# Patient Record
Sex: Female | Born: 1955
Health system: Southern US, Community
[De-identification: ages and names within clinical notes are randomized; demographics above are authoritative.]

## PROBLEM LIST (undated history)

## (undated) DIAGNOSIS — E538 Deficiency of other specified B group vitamins: Secondary | ICD-10-CM

## (undated) DIAGNOSIS — E785 Hyperlipidemia, unspecified: Secondary | ICD-10-CM

## (undated) DIAGNOSIS — M797 Fibromyalgia: Secondary | ICD-10-CM

## (undated) DIAGNOSIS — K219 Gastro-esophageal reflux disease without esophagitis: Secondary | ICD-10-CM

## (undated) DIAGNOSIS — E039 Hypothyroidism, unspecified: Secondary | ICD-10-CM

## (undated) HISTORY — PX: FOOT SURGERY: SHX648

## (undated) HISTORY — DX: Deficiency of other specified B group vitamins: E53.8

## (undated) HISTORY — PX: CHOLECYSTECTOMY: SHX55

## (undated) HISTORY — DX: Hyperlipidemia, unspecified: E78.5

## (undated) HISTORY — DX: Hypothyroidism, unspecified: E03.9

## (undated) HISTORY — PX: KIDNEY SURGERY: SHX687

## (undated) HISTORY — PX: ABDOMINAL HYSTERECTOMY: SHX81

---

## 2004-02-25 ENCOUNTER — Ambulatory Visit: Payer: Self-pay | Admitting: Family Medicine

## 2004-05-01 ENCOUNTER — Ambulatory Visit: Payer: Self-pay | Admitting: Family Medicine

## 2004-06-05 ENCOUNTER — Ambulatory Visit: Payer: Self-pay | Admitting: Family Medicine

## 2004-09-02 ENCOUNTER — Ambulatory Visit: Payer: Self-pay | Admitting: Family Medicine

## 2005-02-09 HISTORY — PX: OTHER SURGICAL HISTORY: SHX169

## 2005-02-28 ENCOUNTER — Emergency Department (HOSPITAL_COMMUNITY): Admission: EM | Admit: 2005-02-28 | Discharge: 2005-03-01 | Payer: Self-pay | Admitting: Emergency Medicine

## 2005-03-03 ENCOUNTER — Ambulatory Visit: Payer: Self-pay | Admitting: Family Medicine

## 2005-03-26 ENCOUNTER — Ambulatory Visit: Payer: Self-pay | Admitting: Family Medicine

## 2005-03-30 ENCOUNTER — Ambulatory Visit (HOSPITAL_COMMUNITY): Admission: RE | Admit: 2005-03-30 | Discharge: 2005-03-30 | Payer: Self-pay | Admitting: Family Medicine

## 2005-04-22 ENCOUNTER — Ambulatory Visit (HOSPITAL_COMMUNITY): Admission: RE | Admit: 2005-04-22 | Discharge: 2005-04-22 | Payer: Self-pay | Admitting: Family Medicine

## 2005-07-28 ENCOUNTER — Ambulatory Visit: Payer: Self-pay | Admitting: Family Medicine

## 2005-10-06 ENCOUNTER — Ambulatory Visit: Payer: Self-pay | Admitting: Family Medicine

## 2006-03-05 ENCOUNTER — Ambulatory Visit: Payer: Self-pay | Admitting: Family Medicine

## 2007-03-23 ENCOUNTER — Encounter: Admission: RE | Admit: 2007-03-23 | Discharge: 2007-03-23 | Payer: Self-pay | Admitting: Orthopedic Surgery

## 2007-05-07 ENCOUNTER — Emergency Department (HOSPITAL_COMMUNITY): Admission: EM | Admit: 2007-05-07 | Discharge: 2007-05-07 | Payer: Self-pay | Admitting: Emergency Medicine

## 2009-03-29 ENCOUNTER — Ambulatory Visit (HOSPITAL_COMMUNITY): Admission: RE | Admit: 2009-03-29 | Discharge: 2009-03-29 | Payer: Self-pay | Admitting: Family Medicine

## 2009-07-09 ENCOUNTER — Ambulatory Visit: Payer: Self-pay | Admitting: Internal Medicine

## 2009-07-09 DIAGNOSIS — R1310 Dysphagia, unspecified: Secondary | ICD-10-CM

## 2009-07-11 DIAGNOSIS — K219 Gastro-esophageal reflux disease without esophagitis: Secondary | ICD-10-CM | POA: Insufficient documentation

## 2009-07-22 ENCOUNTER — Ambulatory Visit (HOSPITAL_COMMUNITY): Admission: RE | Admit: 2009-07-22 | Discharge: 2009-07-22 | Payer: Self-pay | Admitting: Internal Medicine

## 2009-07-22 ENCOUNTER — Ambulatory Visit: Payer: Self-pay | Admitting: Internal Medicine

## 2009-07-22 HISTORY — PX: ESOPHAGOGASTRODUODENOSCOPY: SHX1529

## 2009-07-27 ENCOUNTER — Encounter: Payer: Self-pay | Admitting: Internal Medicine

## 2009-07-29 ENCOUNTER — Encounter: Payer: Self-pay | Admitting: Internal Medicine

## 2009-10-15 ENCOUNTER — Ambulatory Visit: Payer: Self-pay | Admitting: Internal Medicine

## 2010-03-11 NOTE — Assessment & Plan Note (Signed)
Summary: fu egd in june/jbb   Visit Type:  Follow-up Visit Primary Care Provider:  nyland  Chief Complaint:  follow up- omeprazole not helping.  History of Present Illness: Followup GERD. States omeprazole 20 mg daily not quite as good as Nexium - has had about  one breakthrough episode of  reflux weekly since EGD in June of this year. She has lost 4 pounds. Esophageal ring dilated back in June. Dysphagia resolved. She does have a flare in symptoms when she drinks a certain carbonated caffeinated beverages.  States Dr. Laurell Josephs  did a colonoscopy 4 years ago at [facility] - negative per pt..  Current Problems (verified): 1)  Dysphagia  (ICD-787.29) 2)  Genella Rife  (ICD-530.81)  Current Medications (verified): 1)  Synthroid 112 Mcg Tabs (Levothyroxine Sodium) .... Take 1 Tablet By Mouth Once A Day 2)  Omeprazole 20 Mg Cpdr (Omeprazole) .... Take 1 Tablet By Mouth Once A Day 3)  Vitamin D 38756 Iu .... Once A Week 4)  B 12 Injection .... Once Monthly 5)  Hydrochlorothiazide 25 Mg Tabs (Hydrochlorothiazide) .... 1/2 Tablet Daily 6)  Savella 50 Mg Tabs (Milnacipran Hcl) .... Take Two Tablets Daily  Allergies (verified): No Known Drug Allergies  Past History:  Past Medical History: Last updated: 2009/08/03 Hypothyroidism Hypertension Gerd Vit B 12 deficiency Vitamin D deficiency Fibromyalgia  Past Surgical History: Last updated: 2009/08/03 Cholecystectomy Kidney Surgery (Blockage in left kidney) Right foot surgery (ligament) Hysterectomy  Family History: Last updated: Aug 03, 2009 Father: Deceased age 15 heart failure Mother: Living age 33   healthy  breast cancer survivor Siblings: No siblings  Social History: Last updated: 2009-08-03 Marital Status: Married Children: one daughter  Occupation: Disability  Vital Signs:  Patient profile:   55 year old female Height:      69 inches Weight:      199 pounds BMI:     29.49 Temp:     97.4 degrees F oral Pulse rate:   80 /  minute BP sitting:   130 / 88  (left arm) Cuff size:   regular  Vitals Entered By: Hendricks Limes LPN (October 15, 2009 9:29 AM)  Physical Exam  General:  pleasant alert lady no acute distress Lungs:  clear to auscultation Heart:  regular rate rhythm murmur gallop well Abdomen:  nondistended soft nontender without appreciable mass or organomegaly  Impression & Recommendations: Impression: GERD fairly well controlled with omeprazole 20 mg orally daily. She does have occasional breakthrough symptoms. Symptom response fairly good; no need to change out to another agent at this time. However, I told Ms. nice she could bump up dosing  to 40 mg daily on a p.r.n. basis. She was commended on her  weight loss.  If she did lose another 10 or 15 pounds, she probably could get by with the once daily omeprazole 20 mg with very good control of symptoms.  Unless something comes up, we'll plan this is lady back in one year.  Appended Document: Orders Update    Clinical Lists Changes  Orders: Added new Service order of Est. Patient Level III (43329) - Signed

## 2010-03-11 NOTE — Assessment & Plan Note (Signed)
Summary: DIFFICULTY SWALLOWING/CONSULT FOR EGD/SS   Primary Care Provider:  Nyland  Chief Complaint:  difficulty swalowing.  History of Present Illness: 55 year old lady with recurrent esophageal dysphagia to solid food. I've seen this lady back in the 1990s for similar symptoms. I performed an EGD back in 1996; she had a patent tubular esophagus. She responded nicely to passage of the [facility] dilator. She does have long-standing GERD which has been well controlled now on omeprazole 20 mg once daily. She saw Dr. Laurell Josephs in [place] 4 years ago and underwent a screening colonoscopy and was told everything was "OK". She also underwent EGD at that time for reasons which are not clear to me. Patient has had trouble with dysphagia for the past 6 months.  She does not smoke alcohol.  Prior barium pill esophagram years ago demonstrated multiple tertiary contractions but no structural lesion seen which would contribute to dysphagia.   Current Medications (verified): 1)  Synthroid 112 Mcg Tabs (Levothyroxine Sodium) .... Take 1 Tablet By Mouth Once A Day 2)  Omeprazole 20 Mg Cpdr (Omeprazole) .... Take 1 Tablet By Mouth Once A Day 3)  Vitamin D 16109 Iu .... Once A Week 4)  B 12 Injection .... Once Monthly 5)  Hydrochlorothiazide 25 Mg Tabs (Hydrochlorothiazide) .... 1/2 Tablet Daily 6)  Savella 50 Mg Tabs (Milnacipran Hcl) .... Take Two Tablets Daily  Allergies (verified): No Known Drug Allergies  Past History:  Family History: Last updated: 2009/07/24 Father: Deceased age 44 heart failure Mother: Living age 51   healthy  breast cancer survivor Siblings: No siblings  Social History: Last updated: 07-24-2009 Marital Status: Married Children: one daughter  Occupation: Disability  Past Medical History: Hypothyroidism Hypertension Gerd Vit B 12 deficiency Vitamin D deficiency Fibromyalgia  Past Surgical History: Cholecystectomy Kidney Surgery (Blockage in left kidney) Right foot surgery  (ligament) Hysterectomy  Family History: Father: Deceased age 23 heart failure Mother: Living age 72   healthy  breast cancer survivor Siblings: No siblings  Social History: Marital Status: Married Children: one daughter  Occupation: Disability  Vital Signs:  Patient profile:   55 year old female Height:      69 inches Weight:      203 pounds BMI:     30.09 Temp:     97.8 degrees F oral Pulse rate:   80 / minute BP sitting:   130 / 92  (left arm) Cuff size:   large  Vitals Entered By: Cloria Spring LPN (July 24, 2009 10:21 AM)  Physical Exam  General:  pleasant alert lady in no acute distress Eyes:  no scleral icterus. Conjunctiva are pink Lungs:  clear to auscultation Heart:  regular rate and rhythm without murmur gallop rub Abdomen:  nondistended positive bowel sounds soft and nontender without mass or organomegaly  Impression & Recommendations: Impression: Pleasant 55 year old lady with recurrent esophageal dysphagia to solid food. This is in a  background of GERD, well-controlled on omeprazole. She responded nicely to esophageal dilation for a lengthy period of time back in the mid 1990s. I suppose she could have an occult ring or submucosal web to account for some of her symptoms. Alternatively, she could certainly have an underlying esophageal motility disorder contributing to her symptoms.  Recommendations: Discussed diagnostic EGD with esophageal dilation. Reviewed the risks, benefits, limitations, alternatives and imponderables. Her questions have been answered. I feel this would be the most expeditious approach. She did respond nicely to passing a  large bore [facility] dilator previously and would hope  that she would have a similar response in the near future.  Appended Document: Orders Update    Clinical Lists Changes  Problems: Added new problem of GERD (ICD-530.81) Added new problem of DYSPHAGIA (ZOX-096.04) Orders: Added new Service order of New Patient  Level IV (54098) - Signed

## 2010-03-11 NOTE — Letter (Signed)
Summary: EGD/ED ORDER  EGD/ED ORDER   Imported By: Ave Filter 07/09/2009 11:02:28  _____________________________________________________________________  External Attachment:    Type:   Image     Comment:   External Document

## 2010-03-11 NOTE — Letter (Signed)
Summary: Patient Notice, Endo Biopsy Results  [facility]  [address]   [place], [place] [address]   Phone: [phone]  Fax: [phone]       July 27, 2009   [facility] [address] [place], [place]  [address] 06/02/1955    Dear Janet Gardner,  I am pleased to inform you that the biopsies taken during your recent endoscopic examination did not show any evidence of cancer upon pathologic examination.  Additional information/recommendations:  Continue with the treatment plan as outlined on the day of your exam.  Please call us if you are having persistent problems or have questions about your condition that have not been fully answered at this time.  Sincerely,    R. Roetta Sessions MD, FACP [facility] Ph: [phone]   Fax: [phone]   Appended Document: Patient Notice, Endo Biopsy Results letter mailed to pt

## 2010-10-24 ENCOUNTER — Encounter: Payer: Self-pay | Admitting: Internal Medicine

## 2011-02-10 ENCOUNTER — Other Ambulatory Visit: Payer: Self-pay

## 2011-02-10 ENCOUNTER — Emergency Department (HOSPITAL_COMMUNITY)
Admission: EM | Admit: 2011-02-10 | Discharge: 2011-02-10 | Disposition: A | Discharge status: Home / Self Care | Payer: BC Managed Care – PPO | Attending: Emergency Medicine | Admitting: Emergency Medicine

## 2011-02-10 ENCOUNTER — Emergency Department (HOSPITAL_COMMUNITY): Payer: BC Managed Care – PPO

## 2011-02-10 DIAGNOSIS — IMO0001 Reserved for inherently not codable concepts without codable children: Secondary | ICD-10-CM | POA: Insufficient documentation

## 2011-02-10 DIAGNOSIS — R110 Nausea: Secondary | ICD-10-CM | POA: Insufficient documentation

## 2011-02-10 DIAGNOSIS — Z9889 Other specified postprocedural states: Secondary | ICD-10-CM | POA: Insufficient documentation

## 2011-02-10 DIAGNOSIS — R10814 Left lower quadrant abdominal tenderness: Secondary | ICD-10-CM | POA: Insufficient documentation

## 2011-02-10 DIAGNOSIS — R1032 Left lower quadrant pain: Secondary | ICD-10-CM | POA: Insufficient documentation

## 2011-02-10 DIAGNOSIS — K219 Gastro-esophageal reflux disease without esophagitis: Secondary | ICD-10-CM | POA: Insufficient documentation

## 2011-02-10 DIAGNOSIS — Z9079 Acquired absence of other genital organ(s): Secondary | ICD-10-CM | POA: Insufficient documentation

## 2011-02-10 HISTORY — DX: Gastro-esophageal reflux disease without esophagitis: K21.9

## 2011-02-10 HISTORY — DX: Fibromyalgia: M79.7

## 2011-02-10 LAB — DIFFERENTIAL
Eosinophils Absolute: 0.1 10*3/uL (ref 0.0–0.7)
Neutro Abs: 2.7 10*3/uL (ref 1.7–7.7)
Neutrophils Relative %: 65 % (ref 43–77)

## 2011-02-10 LAB — CBC
MCH: 26.4 pg (ref 26.0–34.0)
MCHC: 31.4 g/dL (ref 30.0–36.0)
Platelets: 231 10*3/uL (ref 150–400)
RBC: 4.78 MIL/uL (ref 3.87–5.11)
RDW: 13.7 % (ref 11.5–15.5)

## 2011-02-10 LAB — COMPREHENSIVE METABOLIC PANEL
Albumin: 3.6 g/dL (ref 3.5–5.2)
Alkaline Phosphatase: 74 U/L (ref 39–117)
BUN: 11 mg/dL (ref 6–23)
Calcium: 8.9 mg/dL (ref 8.4–10.5)
Chloride: 105 mEq/L (ref 96–112)
Creatinine, Ser: 0.67 mg/dL (ref 0.50–1.10)
GFR calc Af Amer: >90 mL/min (ref >90)
Glucose, Bld: 97 mg/dL (ref 70–99)
Potassium: 4.7 mEq/L (ref 3.5–5.1)
Sodium: 138 mEq/L (ref 135–145)
Total Protein: 7.5 g/dL (ref 6.0–8.3)

## 2011-02-10 LAB — URINALYSIS, ROUTINE W REFLEX MICROSCOPIC
Bilirubin Urine: NEGATIVE
Nitrite: NEGATIVE
Specific Gravity, Urine: 1.01 (ref 1.005–1.030)
Urobilinogen, UA: 1 mg/dL (ref 0.0–1.0)

## 2011-02-10 LAB — LIPASE, BLOOD: Lipase: 22 U/L (ref 11–59)

## 2011-02-10 LAB — URINE MICROSCOPIC-ADD ON

## 2011-02-10 MED ORDER — GI COCKTAIL ~~LOC~~
30.0000 mL | Freq: Once | ORAL | Status: AC
  Administered 2011-02-10: 30 mL via ORAL
  Filled 2011-02-10: qty 30

## 2011-02-10 MED ORDER — HYDROCODONE-ACETAMINOPHEN 5-500 MG PO TABS: 1.0000 | ORAL_TABLET | Freq: Four times a day (QID) | ORAL | Status: AC | PRN

## 2011-02-10 NOTE — ED Notes (Signed)
Pt states she has indigestion that she cannot get rid of

## 2011-02-10 NOTE — ED Notes (Signed)
Pt return from CT.

## 2011-02-10 NOTE — ED Provider Notes (Signed)
History   This chart was scribed for Geoffery Lyons, MD by Clarita Crane. The patient was seen in room APA01/APA01 and the patient's care was started at 9:22AM.   CSN: [id number]  Arrival date & time 02/10/11  0854   First MD Initiated Contact with Patient 02/10/11 [phone]      Chief Complaint  Patient presents with  . Abdominal Pain  . Arm Pain    (Consider location/radiation/quality/duration/timing/severity/associated sxs/prior treatment) HPI Janet Gardner is a 56 y.o. female who presents to the Emergency Department complaining of constant moderate Left upper quadrant abdominal pain onset this morning and persistent since with associated nausea. Patient also notes she experienced similar left upper quadrant abdominal pain last night which radiated to left scapular region but pain to left scapular region has resolved. Patient notes pain is similar to that previously experienced with exacerbation of GERD but states symptoms were not relieved with use of GERD medications. Denies SOB, cough, vomiting, diarrhea, fever, urinary symptoms and recent ETOH use. Patient with h/o GERD, fibromyalgia, kidney surgery, cholecystectomy and abdominal hysterectomy.   Past Medical History  Diagnosis Date  . GERD (gastroesophageal reflux disease)   . Fibromyalgia     Past Surgical History  Procedure Date  . Kidney surgery   . Cholecystectomy   . Foot surgery     Right  . Abdominal hysterectomy     No family history on file.  History  Substance Use Topics  . Smoking status: Never Smoker   . Smokeless tobacco: Not on file  . Alcohol Use: No    OB History    Grav Para Term Preterm Abortions TAB SAB Ect Mult Living                  Review of Systems 10 Systems reviewed and are negative for acute change except as noted in the HPI.  Allergies  Review of patient's allergies indicates no known allergies.  Home Medications   Current Outpatient Rx  Name Route Sig Dispense Refill  .  LEVOTHYROXINE SODIUM 112 MCG PO TABS Oral Take 112 mcg by mouth daily.      Marland Kitchen MILNACIPRAN HCL 50 MG PO TABS Oral Take 50 mg by mouth 2 (two) times daily.      Marland Kitchen OMEPRAZOLE 20 MG PO CPDR Oral Take 20 mg by mouth daily.        BP 132/74  Pulse 80  Temp(Src) 98.1 F (36.7 C) (Oral)  Resp 16  Ht 5\' 9"  (1.753 m)  Wt 205 lb (92.987 kg)  BMI 30.27 kg/m2  SpO2 97%  Physical Exam  Nursing note and vitals reviewed. Constitutional: She is oriented to person, place, and time. She appears well-developed and well-nourished. No distress.  HENT:  Head: Normocephalic and atraumatic.  Eyes: EOM are normal. Pupils are equal, round, and reactive to light.  Neck: Neck supple. No tracheal deviation present.  Cardiovascular: Normal rate and regular rhythm.  Exam reveals no gallop and no friction rub.   No murmur heard. Pulmonary/Chest: Effort normal. No respiratory distress. She has no wheezes. She has no rales.  Abdominal: Soft. Bowel sounds are normal. She exhibits no distension. There is tenderness (moderate) in the left upper quadrant. There is no rebound, no guarding and no CVA tenderness.  Musculoskeletal: Normal range of motion. She exhibits no edema.  Neurological: She is alert and oriented to person, place, and time. No sensory deficit.  Skin: Skin is warm and dry.  Psychiatric: She has a normal mood  and affect. Her behavior is normal.    ED Course  Procedures (including critical care time)  DIAGNOSTIC STUDIES: Oxygen Saturation is 97% on room air, normal by my interpretation.    COORDINATION OF CARE: 9:27AM- Patient informed of current plan for treatment and agrees with plan at this time.  11:05AM- Patient reports she is feeling better at this time but states left sided abdominal pain is still present.   Labs Reviewed  URINALYSIS, ROUTINE W REFLEX MICROSCOPIC - Abnormal; Notable for the following:    Leukocytes, UA SMALL (*)    All other components within normal limits  URINE  MICROSCOPIC-ADD ON - Abnormal; Notable for the following:    Squamous Epithelial / LPF FEW (*)    All other components within normal limits  CBC  DIFFERENTIAL  COMPREHENSIVE METABOLIC PANEL  LIPASE, BLOOD   Ct Abdomen Pelvis Wo Contrast  02/10/2011  *RADIOLOGY REPORT*  Clinical Data: 1-day history of left flank pain.  Surgical history includes cholecystectomy, hysterectomy, and left ureteral stent placement.  CT ABDOMEN AND PELVIS WITHOUT CONTRAST 02/10/2011:  Technique:  Multidetector CT imaging of the abdomen and pelvis was performed following the standard protocol without intravenous contrast.  Comparison: Report of unenhanced CT abdomen and pelvis 08/24/2006 [facility].  Findings: No evidence of urinary tract calculi or obstruction on either side.  Within the limits of the low dose unenhanced technique, no focal parenchymal abnormality involving either kidney.  Normal low dose unenhanced appearance of the liver, spleen, pancreas, and adrenal glands.  Gallbladder surgically absent.  No biliary ductal dilation.  Mild aorto-iliac atherosclerosis without aneurysm.  Retroaortic left renal vein.  No significant lymphadenopathy.  Stomach decompressed and unremarkable.  Normal-appearing small bowel wall and colon.  Normal decompressed appendix in the right upper pelvis.  Small lipoma involving the ileocecal valve.  No ascites.  Uterus surgically absent.  Normal-appearing ovaries by CT.  No adnexal masses or free pelvic fluid.  Phleboliths in the left lower pelvis.  Urinary bladder decompressed and unremarkable.  Bone window images demonstrate mild degenerative disc disease at L2-3. Visualized lung bases clear apart from mild scarring in the right lower lobe.  Heart size normal.  IMPRESSION:  1.  No evidence of urinary tract calculi or obstruction on either side. 2.  No acute abnormalities involving the abdomen or pelvis.  Original Report Authenticated By: Arnell Sieving, M.D.     No  diagnosis found.    MDM  Labs, CT scan all unremarkable.  Will discharge to home with pain meds, time.        I personally performed the services described in this documentation, which was scribed in my presence. The recorded information has been reviewed and considered.    Geoffery Lyons, MD 02/10/11 1253

## 2011-02-10 NOTE — ED Notes (Signed)
C/o indigestion that started yesterday and now having left arm pain and left side pain. C/o nausea. Denies SOB

## 2011-02-27 ENCOUNTER — Ambulatory Visit: Payer: Medicare Other | Admitting: Gastroenterology

## 2012-10-31 ENCOUNTER — Telehealth: Payer: Self-pay | Admitting: *Deleted

## 2012-10-31 NOTE — Telephone Encounter (Signed)
Pt called stating she has an appt. Next month with Dr. Jena Gauss, pt states she is having really bad abd. Pain and every time she eat's she gets really sick, patient wanted to be seen sooner, I rescheduled her for oct. 6th with Tana Coast. Pt's cell number is [phone]

## 2012-11-09 ENCOUNTER — Encounter: Payer: Self-pay | Admitting: Internal Medicine

## 2012-11-14 ENCOUNTER — Ambulatory Visit (INDEPENDENT_AMBULATORY_CARE_PROVIDER_SITE_OTHER): Payer: Medicare Other | Admitting: Gastroenterology

## 2012-11-14 ENCOUNTER — Encounter: Payer: Self-pay | Admitting: Gastroenterology

## 2012-11-14 VITALS — BP 147/86 | HR 77 | Temp 98.4°F | Ht 69.0 in | Wt 211.2 lb

## 2012-11-14 DIAGNOSIS — K219 Gastro-esophageal reflux disease without esophagitis: Secondary | ICD-10-CM

## 2012-11-14 DIAGNOSIS — K5900 Constipation, unspecified: Secondary | ICD-10-CM | POA: Insufficient documentation

## 2012-11-14 DIAGNOSIS — R1012 Left upper quadrant pain: Secondary | ICD-10-CM | POA: Insufficient documentation

## 2012-11-14 DIAGNOSIS — R110 Nausea: Secondary | ICD-10-CM

## 2012-11-14 MED ORDER — POLYETHYLENE GLYCOL 3350 17 GM/SCOOP PO POWD: 17.0000 g | Freq: Every day | ORAL | Status: DC | PRN

## 2012-11-14 NOTE — Progress Notes (Signed)
CC'd to PCP 

## 2012-11-14 NOTE — Progress Notes (Signed)
Primary Care Physician: Josue Hector, MD  Primary Gastroenterologist:  Roetta Sessions, MD   Chief Complaint  Patient presents with  . Abdominal Pain  . Nausea  . Gas    HPI: Janet Gardner is a 57 y.o. female here for further evaluation of abdominal pain. She was last seen in September 2011. Last EGD as outlined below.  Several week history of recurrent esophageal dysphagia and abdominal pain associated with nausea. States she needs her esophagus stretched again. Prior dilations have helped significantly. Can tolerate applesauce and oatmeal been anything else she she develops left upper quadrant pain, nausea, soft no dysphagia. Previously has done well on Nexium for several years ago she states her insurance stopped paying for it. At that time she started using various over-the-counter antacids without significant relief. Recently Dr. Lysbeth Galas gave her Nexium over-the-counter samples which she's been taking, 20 mg twice a day. Helps some but has not ameliorated her symptoms. No vomiting. BM constipation to diarrhea. Mostly constipation. No significant change throughout the years. Lots of flatulence. Stools hard. May take several attempts to have a bowel movement. Some days has normal stools. No melena, brbpr. Recently had blood work with Dr. Lysbeth Galas. We have requested results. We have also requested copy of her last colonoscopy by Dr. Laurell Josephs.   Current Outpatient Prescriptions  Medication Sig Dispense Refill  . esomeprazole (NEXIUM) 20 MG capsule Take 20 mg by mouth 2 (two) times daily.      Marland Kitchen levothyroxine (SYNTHROID, LEVOTHROID) 112 MCG tablet Take 112 mcg by mouth daily.        . Milnacipran (SAVELLA) 50 MG TABS Take 50 mg by mouth 2 (two) times daily.         No current facility-administered medications for this visit.    Allergies as of 11/14/2012  . (No Known Allergies)   Past Medical History  Diagnosis Date  . GERD (gastroesophageal reflux disease)   . Fibromyalgia   .  Hypothyroidism   . B12 deficiency     monthly injection   Past Surgical History  Procedure Laterality Date  . Kidney surgery    . Cholecystectomy    . Foot surgery      Right  . Abdominal hysterectomy    . Esophagogastroduodenoscopy  07/22/2009    WUJ:[id number] apperaring esophagus s/p 56 dilator/small HH/large ulcerated gastric polyp in the prepyloric antral area. Inflammatory fibroid polyp.  . Colonoscopy  ?2007    Dr. Laurell Josephs   Family History  Problem Relation Age of Onset  . Breast cancer Mother   . Heart disease Father   . Colon cancer Neg Hx    History   Social History  . Marital Status: Married    Spouse Name: N/A    Number of Children: 1  . Years of Education: N/A   Social History Main Topics  . Smoking status: Never Smoker   . Smokeless tobacco: None  . Alcohol Use: No  . Drug Use: No  . Sexual Activity:    Other Topics Concern  . None   Social History Narrative  . None    ROS:  General: Negative for anorexia, weight loss, fever, chills, fatigue, weakness. ENT: Negative for hoarseness, difficulty swallowing , nasal congestion. CV: Negative for chest pain, angina, palpitations, dyspnea on exertion, peripheral edema.  Respiratory: Negative for dyspnea at rest, dyspnea on exertion, cough, sputum, wheezing.  GI: See history of present illness. GU:  Negative for dysuria, hematuria, urinary incontinence, urinary frequency, nocturnal urination.  Endo: Negative  for unusual weight change.    Physical Examination:   BP 147/86  Pulse 77  Temp(Src) 98.4 F (36.9 C) (Oral)  Ht 5\' 9"  (1.753 m)  Wt 211 lb 3.2 oz (95.8 kg)  BMI 31.17 kg/m2  General: Well-nourished, well-developed in no acute distress.  Eyes: No icterus. Mouth: Oropharyngeal mucosa moist and pink , no lesions erythema or exudate. Lungs: Clear to auscultation bilaterally.  Heart: Regular rate and rhythm, no murmurs rubs or gallops.  Abdomen: Bowel sounds are normal, minimal left upper quadrant  tenderness, nondistended, no hepatosplenomegaly or masses, no abdominal bruits or hernia , no rebound or guarding.   Extremities: No lower extremity edema. No clubbing or deformities. Neuro: Alert and oriented x 4   Skin: Warm and dry, no jaundice.   Psych: Alert and cooperative, normal mood and affect.

## 2012-11-14 NOTE — Patient Instructions (Addendum)
1. Upper endoscopy with Dr. Jena Gauss as planned. 2. Start Miralax one capful at bedtime on days you do not have a good BM.  3. I will discuss with Dr. Jena Gauss when your next colonoscopy should be.

## 2012-11-14 NOTE — Addendum Note (Signed)
Addended by: Jennings Books on: 11/14/2012 10:37 AM   Modules accepted: Orders

## 2012-11-14 NOTE — Assessment & Plan Note (Addendum)
Constipation, unchanged. Received copy of her last colonoscopy by Dr. Laurell Josephs. This was done in September 2007. Greater than 75% of the bowel mucosa was obscured by stool, scope was only advanced to mid-TRV colon. Subsequent ACBE was normal, contrast did not reflux appendix or terminal ileum. Will discuss with Dr. Jena Gauss regarding when next TCS should be done.  Start Miralax 17 grams daily prn.

## 2012-11-14 NOTE — Assessment & Plan Note (Signed)
57 year old lady presents with several week history of postprandial nausea, left upper quadrant discomfort, esophageal dysphagia, typical heartburn symptoms. Minimal improvement on Nexium 20 mg twice a day. Last upper endoscopy in 2011, esophagus was normal at that time and she did respond to dilation. She also had a gastric polyp removed. Recommend repeat EGD with dilation in the near future.  I have discussed the risks, alternatives, benefits with regards to but not limited to the risk of reaction to medication, bleeding, infection, perforation and the patient is agreeable to proceed. Written consent to be obtained.  Based on findings, we may need to alter her PPI therapy.

## 2012-11-15 ENCOUNTER — Encounter (HOSPITAL_COMMUNITY): Payer: Self-pay | Admitting: Pharmacy Technician

## 2012-11-16 ENCOUNTER — Encounter: Payer: Self-pay | Admitting: Gastroenterology

## 2012-11-18 NOTE — Progress Notes (Addendum)
Received copy of labs from 10/26/2012. Sodium 137, potassium 3.8, glucose 1:30, BUN 12, creatinine 0.67, total bilirubin 0.3, alkaline phosphatase 65, AST 16, ALT 15, albumin 3.7, calcium 8.2, white blood cells 6200, hemoglobin 12.6, hematocrit 38.9, platelets 259,000, amylase 23, H. pylori IgG 0.6 (negative).  Please let patient know, Her last colonoscopy with Dr. Laurell Josephs was very incomplete due to stool present and even with follow up barium enema Dr. Jena Gauss recommends another colonoscopy at this time or in very near future.   Currently patient is supposed to have EGD 11/24/12. If she wants to do TCS too (if she feels she can complete the bowel prep) lets try to add on. She would need two days of clear liquids and dulcolax 10mg  daily for three days before. Continue miralax as well.

## 2012-11-21 LAB — CBC
Amylase: 23 units/L — AB (ref 25–110)
HCT: 39 %
HELICOBACTER PYLORI AB, IGA: 0.6
HGB: 12.6 g/dL
MCV: 82.4 fL

## 2012-11-21 LAB — COMPREHENSIVE METABOLIC PANEL
AST: 16 U/L
Albumin: 3.7
Alkaline Phosphatase: 65 U/L
BUN: 12 mg/dL (ref 4–21)
Creat: 0.67
Glucose: 130
Potassium: 3.8 mmol/L
Total Bilirubin: 0.3 mg/dL

## 2012-11-21 NOTE — Progress Notes (Signed)
PT called back and left Vm that if she was to drink a lot for the colonoscopy, that she could not do so, that is why she was not clean the last time. She said she is going to pick up her little girl from school and just call her after 3:00 PM.

## 2012-11-21 NOTE — Progress Notes (Signed)
I called and informed pt. She said OK to scheduled the colonoscopy. ( She uses [facility]  [place]). She said she can be reached this afternoon. I told her that it was important that she be where she could get a call from Soledad Gerlach for her instructions today, since she will need dulcolax starting today.

## 2012-11-21 NOTE — Progress Notes (Signed)
Patient is wanting to do just the EGD/ED for now, she said maybe later down the road she will try the TCS again, but for now she dont want it

## 2012-11-21 NOTE — Progress Notes (Signed)
Maybe she can try a spit-dose prep OR Moviprep.

## 2012-11-22 NOTE — Progress Notes (Signed)
Noted  

## 2012-11-24 ENCOUNTER — Encounter (HOSPITAL_COMMUNITY): Payer: Self-pay

## 2012-11-24 ENCOUNTER — Encounter (HOSPITAL_COMMUNITY)
Admission: RE | Disposition: A | Discharge status: Home / Self Care | Payer: Self-pay | Source: Ambulatory Visit | Attending: Internal Medicine

## 2012-11-24 ENCOUNTER — Ambulatory Visit (HOSPITAL_COMMUNITY)
Admission: RE | Admit: 2012-11-24 | Discharge: 2012-11-24 | Disposition: A | Discharge status: Home / Self Care | Payer: BC Managed Care – PPO | Source: Ambulatory Visit | Attending: Internal Medicine | Admitting: Internal Medicine

## 2012-11-24 DIAGNOSIS — R1310 Dysphagia, unspecified: Secondary | ICD-10-CM

## 2012-11-24 DIAGNOSIS — K449 Diaphragmatic hernia without obstruction or gangrene: Secondary | ICD-10-CM

## 2012-11-24 DIAGNOSIS — K219 Gastro-esophageal reflux disease without esophagitis: Secondary | ICD-10-CM

## 2012-11-24 DIAGNOSIS — K5900 Constipation, unspecified: Secondary | ICD-10-CM

## 2012-11-24 DIAGNOSIS — R1012 Left upper quadrant pain: Secondary | ICD-10-CM

## 2012-11-24 DIAGNOSIS — K222 Esophageal obstruction: Secondary | ICD-10-CM | POA: Insufficient documentation

## 2012-11-24 DIAGNOSIS — R110 Nausea: Secondary | ICD-10-CM

## 2012-11-24 HISTORY — PX: ESOPHAGOGASTRODUODENOSCOPY (EGD) WITH ESOPHAGEAL DILATION: SHX5812

## 2012-11-24 SURGERY — ESOPHAGOGASTRODUODENOSCOPY (EGD) WITH ESOPHAGEAL DILATION
Anesthesia: Moderate Sedation

## 2012-11-24 MED ORDER — SODIUM CHLORIDE 0.9 % IV SOLN
INTRAVENOUS | Status: DC
  Administered 2012-11-24: 09:00:00 via INTRAVENOUS

## 2012-11-24 MED ORDER — ONDANSETRON HCL 4 MG/2ML IJ SOLN
INTRAMUSCULAR | Status: DC | PRN
  Administered 2012-11-24: 4 mg via INTRAMUSCULAR

## 2012-11-24 MED ORDER — MIDAZOLAM HCL 5 MG/5ML IJ SOLN
INTRAMUSCULAR | Status: DC | PRN
  Administered 2012-11-24: 1 mg via INTRAVENOUS
  Administered 2012-11-24: 2 mg via INTRAVENOUS
  Administered 2012-11-24: 1 mg via INTRAVENOUS
  Administered 2012-11-24: 2 mg via INTRAVENOUS

## 2012-11-24 MED ORDER — BUTAMBEN-TETRACAINE-BENZOCAINE 2-2-14 % EX AERO
INHALATION_SPRAY | CUTANEOUS | Status: DC | PRN
  Administered 2012-11-24: 2 via TOPICAL

## 2012-11-24 MED ORDER — MEPERIDINE HCL 100 MG/ML IJ SOLN
INTRAMUSCULAR | Status: AC
  Filled 2012-11-24: qty 2

## 2012-11-24 MED ORDER — ONDANSETRON HCL 4 MG/2ML IJ SOLN
INTRAMUSCULAR | Status: AC
  Filled 2012-11-24: qty 2

## 2012-11-24 MED ORDER — MEPERIDINE HCL 100 MG/ML IJ SOLN
INTRAMUSCULAR | Status: DC | PRN
  Administered 2012-11-24 (×2): 50 mg via INTRAVENOUS

## 2012-11-24 MED ORDER — MIDAZOLAM HCL 5 MG/5ML IJ SOLN
INTRAMUSCULAR | Status: AC
  Filled 2012-11-24: qty 5

## 2012-11-24 MED ORDER — STERILE WATER FOR IRRIGATION IR SOLN
Status: DC | PRN
  Administered 2012-11-24: 09:00:00

## 2012-11-24 MED ORDER — MIDAZOLAM HCL 5 MG/5ML IJ SOLN
INTRAMUSCULAR | Status: AC
  Filled 2012-11-24: qty 10

## 2012-11-24 NOTE — Interval H&P Note (Signed)
History and Physical Interval Note:  11/24/2012 9:18 AM  Janet Gardner  has presented today for surgery, with the diagnosis of GERD, LUQ ABD PAIN, NAUSEA DYSPHAGIA  The various methods of treatment have been discussed with the patient and family. After consideration of risks, benefits and other options for treatment, the patient has consented to  Procedure(s) with comments: ESOPHAGOGASTRODUODENOSCOPY (EGD) WITH ESOPHAGEAL DILATION (N/A) - 9:30AM as a surgical intervention .  The patient's history has been reviewed, patient examined, no change in status, stable for surgery.  I have reviewed the patient's chart and labs.  Questions were answered to the patient's satisfaction.     No change. EGD with esophageal dilation, etc. as appropriate.The risks, benefits, limitations, alternatives and imponderables have been reviewed with the patient. Potential for esophageal dilation, biopsy, etc. have also been reviewed.  Questions have been answered. All parties agreeable.  Eula Listen

## 2012-11-24 NOTE — H&P (View-Only) (Signed)
Primary Care Physician: NYLAND,LEONARD ROBERT, MD  Primary Gastroenterologist:  Michael Rourk, MD   Chief Complaint  Patient presents with  . Abdominal Pain  . Nausea  . Gas    HPI: Janet Gardner is a 57 y.o. female here for further evaluation of abdominal pain. She was last seen in September 2011. Last EGD as outlined below.  Several week history of recurrent esophageal dysphagia and abdominal pain associated with nausea. States she needs her esophagus stretched again. Prior dilations have helped significantly. Can tolerate applesauce and oatmeal been anything else she she develops left upper quadrant pain, nausea, soft no dysphagia. Previously has done well on Nexium for several years ago she states her insurance stopped paying for it. At that time she started using various over-the-counter antacids without significant relief. Recently Dr. Nyland gave her Nexium over-the-counter samples which she's been taking, 20 mg twice a day. Helps some but has not ameliorated her symptoms. No vomiting. BM constipation to diarrhea. Mostly constipation. No significant change throughout the years. Lots of flatulence. Stools hard. May take several attempts to have a bowel movement. Some days has normal stools. No melena, brbpr. Recently had blood work with Dr. Nyland. We have requested results. We have also requested copy of her last colonoscopy by Dr. Burke.   Current Outpatient Prescriptions  Medication Sig Dispense Refill  . esomeprazole (NEXIUM) 20 MG capsule Take 20 mg by mouth 2 (two) times daily.      . levothyroxine (SYNTHROID, LEVOTHROID) 112 MCG tablet Take 112 mcg by mouth daily.        . Milnacipran (SAVELLA) 50 MG TABS Take 50 mg by mouth 2 (two) times daily.         No current facility-administered medications for this visit.    Allergies as of 11/14/2012  . (No Known Allergies)   Past Medical History  Diagnosis Date  . GERD (gastroesophageal reflux disease)   . Fibromyalgia   .  Hypothyroidism   . B12 deficiency     monthly injection   Past Surgical History  Procedure Laterality Date  . Kidney surgery    . Cholecystectomy    . Foot surgery      Right  . Abdominal hysterectomy    . Esophagogastroduodenoscopy  07/22/2009    RMR:normal apperaring esophagus s/p 56 dilator/small HH/large ulcerated gastric polyp in the prepyloric antral area. Inflammatory fibroid polyp.  . Colonoscopy  ?2007    Dr. Burke   Family History  Problem Relation Age of Onset  . Breast cancer Mother   . Heart disease Father   . Colon cancer Neg Hx    History   Social History  . Marital Status: Married    Spouse Name: N/A    Number of Children: 1  . Years of Education: N/A   Social History Main Topics  . Smoking status: Never Smoker   . Smokeless tobacco: None  . Alcohol Use: No  . Drug Use: No  . Sexual Activity:    Other Topics Concern  . None   Social History Narrative  . None    ROS:  General: Negative for anorexia, weight loss, fever, chills, fatigue, weakness. ENT: Negative for hoarseness, difficulty swallowing , nasal congestion. CV: Negative for chest pain, angina, palpitations, dyspnea on exertion, peripheral edema.  Respiratory: Negative for dyspnea at rest, dyspnea on exertion, cough, sputum, wheezing.  GI: See history of present illness. GU:  Negative for dysuria, hematuria, urinary incontinence, urinary frequency, nocturnal urination.  Endo: Negative   for unusual weight change.    Physical Examination:   BP 147/86  Pulse 77  Temp(Src) 98.4 F (36.9 C) (Oral)  Ht 5' 9" (1.753 m)  Wt 211 lb 3.2 oz (95.8 kg)  BMI 31.17 kg/m2  General: Well-nourished, well-developed in no acute distress.  Eyes: No icterus. Mouth: Oropharyngeal mucosa moist and pink , no lesions erythema or exudate. Lungs: Clear to auscultation bilaterally.  Heart: Regular rate and rhythm, no murmurs rubs or gallops.  Abdomen: Bowel sounds are normal, minimal left upper quadrant  tenderness, nondistended, no hepatosplenomegaly or masses, no abdominal bruits or hernia , no rebound or guarding.   Extremities: No lower extremity edema. No clubbing or deformities. Neuro: Alert and oriented x 4   Skin: Warm and dry, no jaundice.   Psych: Alert and cooperative, normal mood and affect.    

## 2012-11-24 NOTE — Op Note (Signed)
[facility] [address] [place], [address]   ENDOSCOPY PROCEDURE REPORT  PATIENT: Janet, Gardner  MR#: [id number] BIRTHDATE: Jun 26, 1955 , 57  yrs. old GENDER: Female ENDOSCOPIST: R.  Roetta Sessions, MD FACP FACG REFERRED BY:  Joette Catching, M.D. PROCEDURE DATE:  11/24/2012 PROCEDURE:     EGD with Elease Hashimoto dilation followed by esophageal biopsy  INDICATIONS:     esophageal dysphagia; breakthrough GERD symptoms on twice a day Nexium  INFORMED CONSENT:   The risks, benefits, limitations, alternatives and imponderables have been discussed.  The potential for biopsy, esophogeal dilation, etc. have also been reviewed.  Questions have been answered.  All parties agreeable.  Please see the history and physical in the medical record for more information.  MEDICATIONS:  Versed 6 mg IV and Demerol 100 mg IV in divided doses. Zofran 4 mg IV  DESCRIPTION OF PROCEDURE:   The NW-[id number] ([id number])  endoscope was introduced through the mouth and advanced to the second portion of the duodenum without difficulty or limitations.  The mucosal surfaces were surveyed very carefully during advancement of the scope and upon withdrawal.  Retroflexion view of the proximal stomach and esophagogastric junction was performed.      FINDINGS: Noncritical Schatzki's ring otherwise normal-appearing esophagus. Stomach empty. Small hiatal hernia. Normal gastric mucosa. Patent pylorus. Normal first and second portion of the duodenum  THERAPEUTIC / DIAGNOSTIC MANEUVERS PERFORMED:  A 54 French Maloney dilator was passed to full insertion easily. A look back revealed no apparent complication related to this maneuver.   Subsequently, biopsies of the distal and midesophagus were taken to evaluate for eosinophilic esophagitis   COMPLICATIONS:  None  IMPRESSION:    Noncritical Schatzki's ring-status post dilation as described above. Status post esophageal biopsy after dilation. Hiatal  hernia.  RECOMMENDATIONS:  Stop Nexium; begin Dexilant 60 mg daily.  Followup on pathology.    _______________________________ R. Roetta Sessions, MD FACP [facility] eSigned:  R. Roetta Sessions, MD FACP [facility] 11/24/2012 9:57 AM     CC:

## 2012-11-29 ENCOUNTER — Encounter (HOSPITAL_COMMUNITY): Payer: Self-pay | Admitting: Internal Medicine

## 2012-11-29 ENCOUNTER — Encounter: Payer: Self-pay | Admitting: Internal Medicine

## 2012-12-01 ENCOUNTER — Telehealth: Payer: Self-pay | Admitting: *Deleted

## 2012-12-01 MED ORDER — ONDANSETRON HCL 4 MG PO TABS: 4.0000 mg | ORAL_TABLET | Freq: Three times a day (TID) | ORAL | Status: DC | PRN

## 2012-12-01 NOTE — Telephone Encounter (Signed)
Trial of Zofran short-term for nausea. Continue Dexilant once daily before breakfast. Too soon to tell if its going to help.  Make sure she is taking Miralax every day she has not had a BM. We need to adequately manage her constipation to see where her symptoms settle out.

## 2012-12-01 NOTE — Telephone Encounter (Signed)
Spoke with pt- she is aware of path results. Pt is having nausea but no vomiting and hurting under her Left rib. Her nausea occurs about 1 hour after eating and lasts for about 1-2 hours and then goes away until she eats again. Please advise.

## 2012-12-01 NOTE — Telephone Encounter (Signed)
Pt called stating she still can't eat, everything she eats makes her sick, and still has pain in rib cage. Please advise [phone] pt also wanted to know if her results came back yet, I made pt aware that it would take 7 business days

## 2012-12-02 MED ORDER — DEXLANSOPRAZOLE 60 MG PO CPDR: 60.0000 mg | DELAYED_RELEASE_CAPSULE | Freq: Every day | ORAL | Status: DC

## 2012-12-02 NOTE — Telephone Encounter (Signed)
Pt is aware. Pt is out of dexilant samples. Samples at the front desk for her to pick up. rx for dexilant ok to send to pharmacy per LSL

## 2012-12-02 NOTE — Telephone Encounter (Signed)
Tried to call pt- phone number was busy.  

## 2012-12-06 ENCOUNTER — Ambulatory Visit: Payer: Medicare Other | Admitting: Internal Medicine

## 2013-04-25 ENCOUNTER — Telehealth: Payer: Self-pay | Admitting: Internal Medicine

## 2013-04-25 NOTE — Telephone Encounter (Signed)
Asking for more samples of Dexilant please advise ?

## 2013-05-03 NOTE — Telephone Encounter (Signed)
#  3 boxes of dexilant at the front desk. LM for pt to pick up.

## 2013-06-14 DIAGNOSIS — E039 Hypothyroidism, unspecified: Secondary | ICD-10-CM | POA: Insufficient documentation

## 2015-06-17 DIAGNOSIS — E785 Hyperlipidemia, unspecified: Secondary | ICD-10-CM | POA: Insufficient documentation

## 2015-12-05 ENCOUNTER — Other Ambulatory Visit (HOSPITAL_COMMUNITY): Payer: Self-pay | Admitting: Family Medicine

## 2015-12-05 DIAGNOSIS — Z1231 Encounter for screening mammogram for malignant neoplasm of breast: Secondary | ICD-10-CM

## 2015-12-19 ENCOUNTER — Ambulatory Visit (HOSPITAL_COMMUNITY)
Admission: RE | Admit: 2015-12-19 | Discharge: 2015-12-19 | Disposition: A | Discharge status: Home / Self Care | Payer: Medicare Other | Source: Ambulatory Visit | Attending: Family Medicine | Admitting: Family Medicine

## 2015-12-19 DIAGNOSIS — Z1231 Encounter for screening mammogram for malignant neoplasm of breast: Secondary | ICD-10-CM | POA: Diagnosis not present

## 2016-01-09 ENCOUNTER — Other Ambulatory Visit (HOSPITAL_COMMUNITY): Payer: Self-pay | Admitting: Physician Assistant

## 2016-01-09 DIAGNOSIS — Z780 Asymptomatic menopausal state: Secondary | ICD-10-CM

## 2016-01-16 ENCOUNTER — Ambulatory Visit (HOSPITAL_COMMUNITY)
Admission: RE | Admit: 2016-01-16 | Discharge: 2016-01-16 | Disposition: A | Discharge status: Home / Self Care | Payer: Medicare Other | Source: Ambulatory Visit | Attending: Physician Assistant | Admitting: Physician Assistant

## 2016-01-16 DIAGNOSIS — Z79899 Other long term (current) drug therapy: Secondary | ICD-10-CM | POA: Diagnosis not present

## 2016-01-16 DIAGNOSIS — M8588 Other specified disorders of bone density and structure, other site: Secondary | ICD-10-CM | POA: Diagnosis not present

## 2016-01-16 DIAGNOSIS — Z780 Asymptomatic menopausal state: Secondary | ICD-10-CM | POA: Diagnosis present

## 2017-10-20 ENCOUNTER — Other Ambulatory Visit (HOSPITAL_COMMUNITY): Payer: Self-pay | Admitting: Family Medicine

## 2017-10-20 DIAGNOSIS — Z1231 Encounter for screening mammogram for malignant neoplasm of breast: Secondary | ICD-10-CM

## 2017-10-20 DIAGNOSIS — Z780 Asymptomatic menopausal state: Secondary | ICD-10-CM

## 2018-01-31 ENCOUNTER — Ambulatory Visit (HOSPITAL_COMMUNITY)
Admission: RE | Admit: 2018-01-31 | Discharge: 2018-01-31 | Disposition: A | Discharge status: Home / Self Care | Payer: Medicare Other | Source: Ambulatory Visit | Attending: Family Medicine | Admitting: Family Medicine

## 2018-01-31 ENCOUNTER — Encounter (HOSPITAL_COMMUNITY): Payer: Self-pay

## 2018-01-31 DIAGNOSIS — Z1231 Encounter for screening mammogram for malignant neoplasm of breast: Secondary | ICD-10-CM | POA: Diagnosis present

## 2018-01-31 DIAGNOSIS — Z780 Asymptomatic menopausal state: Secondary | ICD-10-CM | POA: Diagnosis not present

## 2018-03-09 ENCOUNTER — Ambulatory Visit (HOSPITAL_COMMUNITY)
Admission: RE | Admit: 2018-03-09 | Discharge: 2018-03-09 | Disposition: A | Discharge status: Home / Self Care | Payer: Medicare Other | Source: Ambulatory Visit | Attending: Family Medicine | Admitting: Family Medicine

## 2018-03-09 ENCOUNTER — Other Ambulatory Visit (HOSPITAL_COMMUNITY): Payer: Self-pay | Admitting: Family Medicine

## 2018-03-09 DIAGNOSIS — M79672 Pain in left foot: Secondary | ICD-10-CM | POA: Diagnosis present

## 2018-07-19 ENCOUNTER — Ambulatory Visit (INDEPENDENT_AMBULATORY_CARE_PROVIDER_SITE_OTHER): Payer: Medicare Other | Admitting: Internal Medicine

## 2018-07-21 ENCOUNTER — Ambulatory Visit (INDEPENDENT_AMBULATORY_CARE_PROVIDER_SITE_OTHER): Payer: Medicare HMO | Admitting: Internal Medicine

## 2018-07-21 ENCOUNTER — Other Ambulatory Visit: Payer: Self-pay

## 2018-07-21 ENCOUNTER — Encounter (INDEPENDENT_AMBULATORY_CARE_PROVIDER_SITE_OTHER): Payer: Self-pay | Admitting: Internal Medicine

## 2018-07-21 VITALS — BP 164/90 | HR 81 | Temp 98.1°F | Ht 69.0 in | Wt 215.6 lb

## 2018-07-21 DIAGNOSIS — R143 Flatulence: Secondary | ICD-10-CM | POA: Diagnosis not present

## 2018-07-21 NOTE — Patient Instructions (Addendum)
Continue the Simethicone. Try a Probiotic.  Abdominal Bloating When you have abdominal bloating, your abdomen may feel full, tight, or painful. It may also look bigger than normal or swollen (distended). Common causes of abdominal bloating include:  Swallowing air.  Constipation.  Problems digesting food.  Eating too much.  Irritable bowel syndrome. This is a condition that affects the large intestine.  Lactose intolerance. This is an inability to digest lactose, a natural sugar in dairy products.  Celiac disease. This is a condition that affects the ability to digest gluten, a protein found in some grains.  Gastroparesis. This is a condition that slows down the movement of food in the stomach and small intestine. It is more common in people with diabetes mellitus.  Gastroesophageal reflux disease (GERD). This is a digestive condition that makes stomach acid flow back into the esophagus.  Urinary retention. This means that the body is holding onto urine, and the bladder cannot be emptied all the way. Follow these instructions at home: Eating and drinking  Avoid eating too much.  Try not to swallow air while talking or eating.  Avoid eating while lying down.  Avoid these foods and drinks: ? Foods that cause gas, such as broccoli, cabbage, cauliflower, and baked beans. ? Carbonated drinks. ? Hard candy. ? Chewing gum. Medicines  Take over-the-counter and prescription medicines only as told by your health care provider.  Take probiotic medicines. These medicines contain live bacteria or yeasts that can help digestion.  Take coated peppermint oil capsules. Activity  Try to exercise regularly. Exercise may help to relieve bloating that is caused by gas and relieve constipation. General instructions  Keep all follow-up visits as told by your health care provider. This is important. Contact a health care provider if:  You have nausea and vomiting.  You have  diarrhea.  You have abdominal pain.  You have unusual weight loss or weight gain.  You have severe pain, and medicines do not help. Get help right away if:  You have severe chest pain.  You have trouble breathing.  You have shortness of breath.  You have trouble urinating.  You have darker urine than normal.  You have blood in your stools or have dark, tarry stools. Summary  Abdominal bloating means that the abdomen is swollen.  Common causes of abdominal bloating are swallowing air, constipation, and problems digesting food.  Avoid eating too much and avoid swallowing air.  Avoid foods that cause gas, carbonated drinks, hard candy, and chewing gum. This information is not intended to replace advice given to you by your health care provider. Make sure you discuss any questions you have with your health care provider. Document Released: 02/28/2016 Document Revised: 02/28/2016 Document Reviewed: 02/28/2016 Elsevier Interactive Patient Education  2019 [employer].

## 2018-07-21 NOTE — Progress Notes (Signed)
Subjective:    Patient ID: Janet Gardner, female    DOB: October 25, 1955, 63 y.o.   MRN: [id number]  HPI Referred by Dr. Edrick Oh for bloating .  No fever or chills. She says she has flatus "all the time". She has been using Gas X which helps some.  Her appetite is okay. She has cut out soft drinks. She is trying to loose weight. She has a BM every 2-3 days. She chew Gummies with fiber which help. Appetite is good.  She does not have acid reflux.    11/24/2012 EGD/ED dysphagia, breakthru GERD Non critical Schatzki's ring status post dilatation. Status post esophageal biopsy after dilatation. Hiatal Hernia.  Biopsy: Slight inflamed squamous epithelium. No evidence of active inflammation or fungal organism. No eosinophilic esophagitis. No evidence of intestinal metaplasia, dysplasia, or malignancy.   Review of Systems Past Medical History:  Diagnosis Date  . B12 deficiency    monthly injection  . Fibromyalgia   . GERD (gastroesophageal reflux disease)   . Hypothyroidism     Past Surgical History:  Procedure Laterality Date  . ABDOMINAL HYSTERECTOMY    . CHOLECYSTECTOMY    . colonoscopy  2007   Dr. Lavone Neri, incomplete due to poor prep. scope passed to transverse colon. ACBE normal.  . ESOPHAGOGASTRODUODENOSCOPY  07/22/2009   OAC:ZYSAYT apperaring esophagus s/p 56 dilator/small HH/large ulcerated gastric polyp in the prepyloric antral area. Inflammatory fibroid polyp.  . ESOPHAGOGASTRODUODENOSCOPY (EGD) WITH ESOPHAGEAL DILATION N/A 11/24/2012   Procedure: ESOPHAGOGASTRODUODENOSCOPY (EGD) WITH ESOPHAGEAL DILATION;  Surgeon: Daneil Dolin, MD;  Location: [facility];  Service: Endoscopy;  Laterality: N/A;  9:30AM  . FOOT SURGERY     Right  . KIDNEY SURGERY      Allergies  Allergen Reactions  . Levaquin [Levofloxacin] Nausea And Vomiting  . Sulfa Antibiotics     Vomiting    Current Outpatient Medications on File Prior to Visit  Medication Sig Dispense Refill  . Cholecalciferol  (VITAMIN D3) 1.25 MG (50000 UT) TABS Take by mouth once a week.    . levothyroxine (SYNTHROID, LEVOTHROID) 112 MCG tablet Take 112 mcg by mouth daily.      . cyanocobalamin (,VITAMIN B-12,) 1000 MCG/ML injection Inject 1,000 mcg into the muscle every 30 (thirty) days.    Marland Kitchen dexlansoprazole (DEXILANT) 60 MG capsule Take 1 capsule (60 mg total) by mouth daily. (Patient not taking: Reported on 07/21/2018) 30 capsule 5  . esomeprazole (NEXIUM) 20 MG capsule Take 20 mg by mouth 2 (two) times daily.    . Milnacipran (SAVELLA) 50 MG TABS Take 50 mg by mouth 2 (two) times daily.      . ondansetron (ZOFRAN) 4 MG tablet Take 1 tablet (4 mg total) by mouth every 8 (eight) hours as needed for nausea. (Patient not taking: Reported on 07/21/2018) 30 tablet 1  . polyethylene glycol powder (GLYCOLAX/MIRALAX) powder Take 17 g by mouth daily as needed. (Patient not taking: Reported on 07/21/2018) 527 g 3   No current facility-administered medications on file prior to visit.         Objective:   Physical Exam Blood pressure (!) 164/90, pulse 81, temperature 98.1 F (36.7 C), height 5\' 9"  (1.753 m), weight 215 lb 9.6 oz (97.8 kg). Alert and oriented. Skin warm and dry. Oral mucosa is moist.   . Sclera anicteric, conjunctivae is pink. Thyroid not enlarged. No cervical lymphadenopathy. Lungs clear. Heart regular rate and rhythm.  Abdomen is soft. Bowel sounds are positive. No hepatomegaly. No  abdominal masses felt. No tenderness.  No edema to lower extremities.          Assessment & Plan:  Flatus. Continue the Simethicone. Try a Probiotic. OV in 3 months.

## 2018-08-10 ENCOUNTER — Other Ambulatory Visit: Payer: Self-pay

## 2018-08-11 ENCOUNTER — Ambulatory Visit: Payer: Self-pay | Admitting: Family

## 2018-09-01 ENCOUNTER — Other Ambulatory Visit: Payer: Self-pay

## 2018-09-02 ENCOUNTER — Encounter: Payer: Self-pay | Admitting: Family Medicine

## 2018-09-02 ENCOUNTER — Ambulatory Visit (INDEPENDENT_AMBULATORY_CARE_PROVIDER_SITE_OTHER): Payer: Medicare HMO | Admitting: Family Medicine

## 2018-09-02 VITALS — BP 142/78 | HR 77 | Temp 97.7°F | Ht 69.0 in | Wt 217.0 lb

## 2018-09-02 DIAGNOSIS — Z9889 Other specified postprocedural states: Secondary | ICD-10-CM

## 2018-09-02 DIAGNOSIS — Z6832 Body mass index (BMI) 32.0-32.9, adult: Secondary | ICD-10-CM | POA: Diagnosis not present

## 2018-09-02 DIAGNOSIS — M8580 Other specified disorders of bone density and structure, unspecified site: Secondary | ICD-10-CM

## 2018-09-02 DIAGNOSIS — E039 Hypothyroidism, unspecified: Secondary | ICD-10-CM

## 2018-09-02 DIAGNOSIS — E538 Deficiency of other specified B group vitamins: Secondary | ICD-10-CM

## 2018-09-02 DIAGNOSIS — E785 Hyperlipidemia, unspecified: Secondary | ICD-10-CM

## 2018-09-02 DIAGNOSIS — E559 Vitamin D deficiency, unspecified: Secondary | ICD-10-CM | POA: Diagnosis not present

## 2018-09-02 DIAGNOSIS — Z90710 Acquired absence of both cervix and uterus: Secondary | ICD-10-CM | POA: Insufficient documentation

## 2018-09-02 DIAGNOSIS — M797 Fibromyalgia: Secondary | ICD-10-CM | POA: Diagnosis not present

## 2018-09-02 DIAGNOSIS — Z7689 Persons encountering health services in other specified circumstances: Secondary | ICD-10-CM

## 2018-09-02 DIAGNOSIS — Z23 Encounter for immunization: Secondary | ICD-10-CM | POA: Diagnosis not present

## 2018-09-02 DIAGNOSIS — Z9049 Acquired absence of other specified parts of digestive tract: Secondary | ICD-10-CM

## 2018-09-02 MED ORDER — CYANOCOBALAMIN 1000 MCG/ML IJ SOLN
1000.0000 ug | Freq: Once | INTRAMUSCULAR | Status: AC
  Administered 2018-09-02: 09:00:00 1000 ug via INTRAMUSCULAR

## 2018-09-02 MED ORDER — CALCIUM CARBONATE-VITAMIN D 500-200 MG-UNIT PO TABS: 1.0000 | ORAL_TABLET | Freq: Every day | ORAL | 3 refills | Status: DC

## 2018-09-02 NOTE — Patient Instructions (Signed)
It was a pleasure seeing you today, Janet Gardner.  Information regarding what we discussed is included in this packet.  Please make an appointment to see me in 3 months.   In a few days you may receive a survey in the mail or online from [employer] regarding your visit with [place] today. Please take a moment to fill this out. Your feedback is very important to our office. It can help [place] better understand your needs as well as improve your experience and satisfaction. Thank you for taking your time to complete it. We care about you.  Because of recent events of COVID-19 ("Coronavirus"), please follow CDC recommendations:   1. Wash your hand frequently 2. Avoid touching your face 3. Stay away from people who are sick 4. If you have symptoms such as fever, cough, shortness of breath then call your healthcare provider for further guidance 5. If you are sick, STAY AT HOME, unless otherwise directed by your healthcare provider. 6. Follow directions from state and national officials regarding staying safe    Please feel free to call our office if any questions or concerns arise.  Warm Regards, Monia Pouch, FNP-C [facility] [place] [address] [place], [place] [address] [phone]

## 2018-09-02 NOTE — Progress Notes (Signed)
Subjective:  Patient ID: Janet Gardner, female    DOB: [birth year], 63 y.o.   MRN: [id number]  Patient Care Team: Baruch Gouty, FNP as PCP - General (Family Medicine) Gala Romney Cristopher Estimable, MD as Consulting Physician (Gastroenterology)   Chief Complaint:  New Patient (Initial Visit) and Medical Management of Chronic Issues   HPI: Janet Gardner is a 63 y.o. female presenting on 09/02/2018 for New Patient (Initial Visit) and Medical Management of Chronic Issues   1. Encounter to establish care  Pt presents today to establish care. Pt is a former pt of Dr. Edrick Oh who has retired. Pt states her last OV with Dr. Edrick Oh was in May.   2. Acquired hypothyroidism  On repletion therapy. Last noted TSH in EHR 1.960 on 02/03/2017. Pt denies fatigue, mood changes, skin changes, hair changes, nail changes, weight changes, or heat / cole intolerances. Pt states she does have constipation at times.    3. Dyslipidemia  Pt does not exercise on a regular basis and does not watch diet. She is not on statin therapy. Last LDL 120 on 06/30/2018.    4. Fibromyalgia  Not on medications for her fibromyalgia pain. States she receives monthly B12 injections that helps to control her pain and fatigue due to the fibromyalgia.    5. Vitamin D deficiency  On oral repletion therapy. Last Vit D level was normal, 47.5, on 06/30/2018. No muscle weakness or fatigue.    6. Vitamin B12 deficiency  Receives monthly IM repletion infections. States she has had increased energy and less fibromyalgia pain since initiation of B12 repletion.      Relevant past medical, surgical, family, and social history reviewed and updated as indicated.  Allergies and medications reviewed and updated. Date reviewed: Chart in Epic.   Past Medical History:  Diagnosis Date  . B12 deficiency    monthly injection  . Fibromyalgia   . GERD (gastroesophageal reflux disease)   . Hypothyroidism     Past Surgical History:  Procedure  Laterality Date  . ABDOMINAL HYSTERECTOMY    . CHOLECYSTECTOMY    . colonoscopy  2007   Dr. Lavone Neri, incomplete due to poor prep. scope passed to transverse colon. ACBE normal.  . ESOPHAGOGASTRODUODENOSCOPY  07/22/2009   BSJ:GGEZMO apperaring esophagus s/p 56 dilator/small HH/large ulcerated gastric polyp in the prepyloric antral area. Inflammatory fibroid polyp.  . ESOPHAGOGASTRODUODENOSCOPY (EGD) WITH ESOPHAGEAL DILATION N/A 11/24/2012   Procedure: ESOPHAGOGASTRODUODENOSCOPY (EGD) WITH ESOPHAGEAL DILATION;  Surgeon: Daneil Dolin, MD;  Location: [facility];  Service: Endoscopy;  Laterality: N/A;  9:30AM  . FOOT SURGERY     Right  . KIDNEY SURGERY      Social History   Socioeconomic History  . Marital status: Married    Spouse name: Not on file  . Number of children: 1  . Years of education: Not on file  . Highest education level: Not on file  Occupational History  . Not on file  Social Needs  . Financial resource strain: Not on file  . Food insecurity    Worry: Not on file    Inability: Not on file  . Transportation needs    Medical: Not on file    Non-medical: Not on file  Tobacco Use  . Smoking status: Never Smoker  . Smokeless tobacco: Never Used  Substance and Sexual Activity  . Alcohol use: No  . Drug use: No  . Sexual activity: Not on file  Lifestyle  . Physical  activity    Days per week: Not on file    Minutes per session: Not on file  . Stress: Not on file  Relationships  . Social Herbalist on phone: Not on file    Gets together: Not on file    Attends religious service: Not on file    Active member of club or organization: Not on file    Attends meetings of clubs or organizations: Not on file    Relationship status: Not on file  . Intimate partner violence    Fear of current or ex partner: Not on file    Emotionally abused: Not on file    Physically abused: Not on file    Forced sexual activity: Not on file  Other Topics Concern  . Not  on file  Social History Narrative  . Not on file    Outpatient Encounter Medications as of 09/02/2018  Medication Sig  . Cholecalciferol (VITAMIN D3) 1.25 MG (50000 UT) TABS Take by mouth once a week.  . cyanocobalamin (,VITAMIN B-12,) 1000 MCG/ML injection Inject 1,000 mcg into the muscle every 30 (thirty) days.  Marland Kitchen levothyroxine (SYNTHROID, LEVOTHROID) 112 MCG tablet Take 112 mcg by mouth daily.    . [DISCONTINUED] dexlansoprazole (DEXILANT) 60 MG capsule Take 1 capsule (60 mg total) by mouth daily. (Patient not taking: Reported on 07/21/2018)  . [DISCONTINUED] esomeprazole (NEXIUM) 20 MG capsule Take 20 mg by mouth 2 (two) times daily.  . [DISCONTINUED] Milnacipran (SAVELLA) 50 MG TABS Take 50 mg by mouth 2 (two) times daily.    . [DISCONTINUED] ondansetron (ZOFRAN) 4 MG tablet Take 1 tablet (4 mg total) by mouth every 8 (eight) hours as needed for nausea. (Patient not taking: Reported on 07/21/2018)  . [DISCONTINUED] polyethylene glycol powder (GLYCOLAX/MIRALAX) powder Take 17 g by mouth daily as needed. (Patient not taking: Reported on 07/21/2018)   Facility-Administered Encounter Medications as of 09/02/2018  Medication  . cyanocobalamin ((VITAMIN B-12)) injection 1,000 mcg    Allergies  Allergen Reactions  . Levaquin [Levofloxacin] Nausea And Vomiting  . Sulfa Antibiotics     Vomiting    Review of Systems  Constitutional: Positive for fatigue. Negative for activity change, appetite change, chills, diaphoresis, fever and unexpected weight change.  Eyes: Negative for photophobia and visual disturbance.  Respiratory: Negative for cough, chest tightness, shortness of breath and wheezing.   Cardiovascular: Negative for chest pain, palpitations and leg swelling.  Gastrointestinal: Positive for abdominal distention (ongoing, followed by GI) and constipation. Negative for abdominal pain, anal bleeding, blood in stool, diarrhea, nausea, rectal pain and vomiting.  Endocrine: Negative for  cold intolerance, heat intolerance, polydipsia, polyphagia and polyuria.  Genitourinary: Negative for decreased urine volume, difficulty urinating and hematuria.  Musculoskeletal: Positive for arthralgias and myalgias. Negative for gait problem and joint swelling.  Neurological: Negative for dizziness, tremors, seizures, syncope, facial asymmetry, speech difficulty, weakness, light-headedness, numbness and headaches.  Hematological: Negative for adenopathy. Does not bruise/bleed easily.  Psychiatric/Behavioral: Negative for agitation, confusion, sleep disturbance and suicidal ideas.  All other systems reviewed and are negative.       Objective:  BP (!) 142/78   Pulse 77   Temp 97.7 F (36.5 C) (Temporal)   Ht '5\' 9"'  (1.753 m)   Wt 217 lb (98.4 kg)   BMI 32.05 kg/m    Wt Readings from Last 3 Encounters:  09/02/18 217 lb (98.4 kg)  07/21/18 215 lb 9.6 oz (97.8 kg)  11/24/12 205 lb (93 kg)  Physical Exam Vitals signs and nursing note reviewed.  Constitutional:      General: She is not in acute distress.    Appearance: Normal appearance. She is well-developed and well-groomed. She is obese. She is not ill-appearing, toxic-appearing or diaphoretic.  HENT:     Head: Normocephalic and atraumatic.     Jaw: There is normal jaw occlusion.     Right Ear: Hearing, tympanic membrane, ear canal and external ear normal.     Left Ear: Hearing, tympanic membrane, ear canal and external ear normal.     Nose: Nose normal.     Mouth/Throat:     Lips: Pink.     Mouth: Mucous membranes are moist.     Pharynx: Oropharynx is clear. Uvula midline.  Eyes:     General: Lids are normal.     Extraocular Movements: Extraocular movements intact.     Conjunctiva/sclera: Conjunctivae normal.     Pupils: Pupils are equal, round, and reactive to light.  Neck:     Musculoskeletal: Normal range of motion and neck supple.     Thyroid: No thyroid mass, thyromegaly or thyroid tenderness.     Vascular: No  carotid bruit or JVD.     Trachea: Trachea and phonation normal.  Cardiovascular:     Rate and Rhythm: Normal rate and regular rhythm.     Chest Wall: PMI is not displaced.     Pulses: Normal pulses.     Heart sounds: Normal heart sounds. No murmur. No friction rub. No gallop.   Pulmonary:     Effort: Pulmonary effort is normal. No respiratory distress.     Breath sounds: Normal breath sounds. No wheezing.  Abdominal:     General: Bowel sounds are normal. There is no distension or abdominal bruit.     Palpations: Abdomen is soft. There is no hepatomegaly or splenomegaly.     Tenderness: There is no abdominal tenderness. There is no right CVA tenderness or left CVA tenderness.     Hernia: No hernia is present.  Musculoskeletal: Normal range of motion.     Right lower leg: No edema.     Left lower leg: No edema.  Lymphadenopathy:     Cervical: No cervical adenopathy.  Skin:    General: Skin is warm and dry.     Capillary Refill: Capillary refill takes less than 2 seconds.     Coloration: Skin is not cyanotic, jaundiced or pale.     Findings: No rash.  Neurological:     General: No focal deficit present.     Mental Status: She is alert and oriented to person, place, and time.     Cranial Nerves: Cranial nerves are intact.     Sensory: Sensation is intact.     Motor: Motor function is intact.     Coordination: Coordination is intact.     Gait: Gait is intact.     Deep Tendon Reflexes: Reflexes are normal and symmetric.  Psychiatric:        Attention and Perception: Attention and perception normal.        Mood and Affect: Affect normal. Mood is anxious.        Speech: Speech normal.        Behavior: Behavior normal. Behavior is cooperative.        Thought Content: Thought content normal.        Cognition and Memory: Cognition and memory normal.        Judgment: Judgment normal.  Results for orders placed or performed in visit on 11/21/12  Comprehensive metabolic panel   Result Value Ref Range   Sodium 137 137 - 147 mmol/L   Potassium 3.8 mmol/L   Glucose 130    BUN 12 4 - 21 mg/dL   Creat 0.67    Total Bilirubin 0.3 mg/dL   Alkaline Phosphatase 65 U/L   AST 16 U/L   ALT 15 7 - 35 U/L   Albumin 3.7    Calcium 8.2 mg/dL  CBC  Result Value Ref Range   WBC 6.2    HGB 12.6 g/dL   HCT 39 %   MCV 82.4 fL   platelet count 259    Amylase 23 (A) 25 - 579 units/L   HELICOBACTER PYLORI AB, IGA 0.60        Pertinent labs & imaging results that were available during my care of the patient were reviewed by me and considered in my medical decision making.  Assessment & Plan:  Kaysen was seen today for new patient (initial visit) and medical management of chronic issues.  Diagnoses and all orders for this visit:  Encounter to establish care  Acquired hypothyroidism Labs pending. Last TSH in 01/2017. Will adjust therapy if warranted.  -     Thyroid Panel With TSH  Dyslipidemia Not on statin therapy. Last LDL 120. Will recheck today and initiate therapy if warranted. Diet and exercise encouraged.  -     CMP14+EGFR -     Lipid panel  Fibromyalgia Ongoing. Symptoms managed with monthly B12 injections. No new or worsening symptoms.   Vitamin D deficiency Last Vit D level normal. Labs pending. Continue repletion therapy.  -     VITAMIN D 25 Hydroxy (Vit-D Deficiency, Fractures)  Vitamin B12 deficiency B12 injection due today and given in office today. Labs pending. Will continue repletion therapy monthly.  -     CBC with Differential/Platelet -     Vitamin B12 -     cyanocobalamin ((VITAMIN B-12)) injection 1,000 mcg  BMI 32.0-32.9,adult Diet and exercise encouraged. Will recheck BMI in 3 months. Labs pending. -     CBC with Differential/Platelet -     CMP14+EGFR -     Lipid panel -     Thyroid Panel With TSH  Need for pneumococcal vaccine -     Pneumococcal conjugate vaccine 13-valent     Continue all other maintenance medications.   Follow up plan: Return in about 3 months (around 12/03/2018), or if symptoms worsen or fail to improve.  Educational handout given for survey, COVID-19  The above assessment and management plan was discussed with the patient. The patient verbalized understanding of and has agreed to the management plan. Patient is aware to call the clinic if symptoms persist or worsen. Patient is aware when to return to the clinic for a follow-up visit. Patient educated on when it is appropriate to go to the emergency department.   Monia Pouch, FNP-C [facility] [phone] 09/02/18

## 2018-09-04 MED ORDER — ATORVASTATIN CALCIUM 20 MG PO TABS: 20.0000 mg | ORAL_TABLET | Freq: Every day | ORAL | 3 refills | Status: DC

## 2018-09-04 NOTE — Addendum Note (Signed)
Addended by: Baruch Gouty on: 09/04/2018 09:56 AM   Modules accepted: Orders

## 2018-09-05 ENCOUNTER — Other Ambulatory Visit: Payer: Self-pay | Admitting: *Deleted

## 2018-09-05 ENCOUNTER — Telehealth: Payer: Self-pay | Admitting: Family Medicine

## 2018-09-05 DIAGNOSIS — E785 Hyperlipidemia, unspecified: Secondary | ICD-10-CM

## 2018-09-05 DIAGNOSIS — M8580 Other specified disorders of bone density and structure, unspecified site: Secondary | ICD-10-CM

## 2018-09-05 LAB — THYROID PANEL WITH TSH
Free Thyroxine Index: 2.9 (ref 1.2–4.9)
T3 Uptake Ratio: 25 % (ref 24–39)
T4, Total: 11.4 ug/dL (ref 4.5–12.0)
TSH: 0.915 u[IU]/mL (ref 0.450–4.500)

## 2018-09-05 LAB — LIPID PANEL
Chol/HDL Ratio: 4.2 ratio (ref 0.0–4.4)
Cholesterol, Total: 177 mg/dL (ref 100–199)
HDL: 42 mg/dL (ref >39)
LDL Calculated: 113 mg/dL — ABNORMAL HIGH (ref 0–99)
Triglycerides: 111 mg/dL (ref 0–149)
VLDL Cholesterol Cal: 22 mg/dL (ref 5–40)

## 2018-09-05 LAB — CBC WITH DIFFERENTIAL/PLATELET
Basophils Absolute: 0 10*3/uL (ref 0.0–0.2)
Basos: 1 %
EOS (ABSOLUTE): 0.2 10*3/uL (ref 0.0–0.4)
Eos: 3 %
Hematocrit: 40.3 % (ref 34.0–46.6)
Hemoglobin: 12.9 g/dL (ref 11.1–15.9)
Immature Grans (Abs): 0 10*3/uL (ref 0.0–0.1)
Immature Granulocytes: 0 %
Lymphocytes Absolute: 1.1 10*3/uL (ref 0.7–3.1)
Lymphs: 21 %
MCH: 26.8 pg (ref 26.6–33.0)
MCHC: 32 g/dL (ref 31.5–35.7)
MCV: 84 fL (ref 79–97)
Monocytes Absolute: 0.4 10*3/uL (ref 0.1–0.9)
Monocytes: 8 %
Neutrophils Absolute: 3.5 10*3/uL (ref 1.4–7.0)
Neutrophils: 67 %
Platelets: 243 10*3/uL (ref 150–450)
RBC: 4.82 x10E6/uL (ref 3.77–5.28)
RDW: 13.2 % (ref 11.7–15.4)
WBC: 5.3 10*3/uL (ref 3.4–10.8)

## 2018-09-05 LAB — VITAMIN D 25 HYDROXY (VIT D DEFICIENCY, FRACTURES): Vit D, 25-Hydroxy: 26.9 ng/mL — ABNORMAL LOW (ref 30.0–100.0)

## 2018-09-05 LAB — VITAMIN B12: Vitamin B-12: >2000 pg/mL — ABNORMAL HIGH (ref 232–1245)

## 2018-09-05 LAB — CMP14+EGFR
ALT: 15 IU/L (ref 0–32)
AST: 21 IU/L (ref 0–40)
Albumin/Globulin Ratio: 1.2 (ref 1.2–2.2)
Albumin: 3.7 g/dL — ABNORMAL LOW (ref 3.8–4.8)
Alkaline Phosphatase: 74 IU/L (ref 39–117)
BUN/Creatinine Ratio: 16 (ref 12–28)
BUN: 10 mg/dL (ref 8–27)
Bilirubin Total: 0.2 mg/dL (ref 0.0–1.2)
CO2: 23 mmol/L (ref 20–29)
Calcium: 8.7 mg/dL (ref 8.7–10.3)
Chloride: 103 mmol/L (ref 96–106)
Creatinine, Ser: 0.64 mg/dL (ref 0.57–1.00)
GFR calc Af Amer: 108 mL/min/{1.73_m2} (ref >59)
GFR calc non Af Amer: 94 mL/min/{1.73_m2} (ref >59)
Globulin, Total: 3.2 g/dL (ref 1.5–4.5)
Glucose: 82 mg/dL (ref 65–99)
Potassium: 4.4 mmol/L (ref 3.5–5.2)
Sodium: 140 mmol/L (ref 134–144)
Total Protein: 6.9 g/dL (ref 6.0–8.5)

## 2018-09-05 MED ORDER — ATORVASTATIN CALCIUM 20 MG PO TABS: 20.0000 mg | ORAL_TABLET | Freq: Every day | ORAL | 3 refills | Status: DC

## 2018-09-05 MED ORDER — CALCIUM CARBONATE-VITAMIN D 500-200 MG-UNIT PO TABS: 1.0000 | ORAL_TABLET | Freq: Every day | ORAL | 3 refills | Status: DC

## 2018-09-05 NOTE — Telephone Encounter (Signed)
Aware.  Scripts were sent to [facility] per patient request.

## 2018-09-20 ENCOUNTER — Other Ambulatory Visit: Payer: Self-pay

## 2018-09-21 ENCOUNTER — Encounter: Payer: Self-pay | Admitting: Family Medicine

## 2018-09-21 ENCOUNTER — Ambulatory Visit (INDEPENDENT_AMBULATORY_CARE_PROVIDER_SITE_OTHER): Payer: Medicare HMO | Admitting: Family Medicine

## 2018-09-21 VITALS — BP 148/78 | HR 84 | Temp 96.6°F | Ht 69.0 in | Wt 220.0 lb

## 2018-09-21 DIAGNOSIS — M5431 Sciatica, right side: Secondary | ICD-10-CM | POA: Diagnosis not present

## 2018-09-21 MED ORDER — METHYLPREDNISOLONE ACETATE 40 MG/ML IJ SUSP: 40.0000 mg | Freq: Once | INTRAMUSCULAR | Status: DC

## 2018-09-21 MED ORDER — METHYLPREDNISOLONE ACETATE 80 MG/ML IJ SUSP
40.0000 mg | Freq: Once | INTRAMUSCULAR | Status: AC
  Administered 2018-09-21: 13:00:00 40 mg via INTRAMUSCULAR

## 2018-09-21 MED ORDER — MELOXICAM 7.5 MG PO TABS: 7.5000 mg | ORAL_TABLET | Freq: Every day | ORAL | 1 refills | Status: DC

## 2018-09-21 MED ORDER — PREDNISONE 20 MG PO TABS: ORAL_TABLET | ORAL | 0 refills | Status: DC

## 2018-09-21 NOTE — Patient Instructions (Signed)
Sciatica ° °Sciatica is pain, weakness, tingling, or loss of feeling (numbness) along the sciatic nerve. The sciatic nerve starts in the lower back and goes down the back of each leg. Sciatica usually goes away on its own or with treatment. Sometimes, sciatica may come back (recur). °What are the causes? °This condition happens when the sciatic nerve is pinched or has pressure put on it. This may be the result of: °· A disk in between the bones of the spine bulging out too far (herniated disk). °· Changes in the spinal disks that occur with aging. °· A condition that affects a muscle in the butt. °· Extra bone growth near the sciatic nerve. °· A break (fracture) of the area between your hip bones (pelvis). °· Pregnancy. °· Tumor. This is rare. °What increases the risk? °You are more likely to develop this condition if you: °· Play sports that put pressure or stress on the spine. °· Have poor strength and ease of movement (flexibility). °· Have had a back injury in the past. °· Have had back surgery. °· Sit for long periods of time. °· Do activities that involve bending or lifting over and over again. °· Are very overweight (obese). °What are the signs or symptoms? °Symptoms can vary from mild to very bad. They may include: °· Any of these problems in the lower back, leg, hip, or butt: °? Mild tingling, loss of feeling, or dull aches. °? Burning sensations. °? Sharp pains. °· Loss of feeling in the back of the calf or the sole of the foot. °· Leg weakness. °· Very bad back pain that makes it hard to move. °These symptoms may get worse when you cough, sneeze, or laugh. They may also get worse when you sit or stand for long periods of time. °How is this treated? °This condition often gets better without any treatment. However, treatment may include: °· Changing or cutting back on physical activity when you have pain. °· Doing exercises and stretching. °· Putting ice or heat on the affected area. °· Medicines that  help: °? To relieve pain and swelling. °? To relax your muscles. °· Shots (injections) of medicines that help to relieve pain, irritation, and swelling. °· Surgery. °Follow these instructions at home: °Medicines °· Take over-the-counter and prescription medicines only as told by your doctor. °· Ask your doctor if the medicine prescribed to you: °? Requires you to avoid driving or using heavy machinery. °? Can cause trouble pooping (constipation). You may need to take these steps to prevent or treat trouble pooping: °§ Drink enough fluids to keep your pee (urine) pale yellow. °§ Take over-the-counter or prescription medicines. °§ Eat foods that are high in fiber. These include beans, whole grains, and fresh fruits and vegetables. °§ Limit foods that are high in fat and sugar. These include fried or sweet foods. °Managing pain ° °  ° °· If told, put ice on the affected area. °? Put ice in a plastic bag. °? Place a towel between your skin and the bag. °? Leave the ice on for 20 minutes, 2-3 times a day. °· If told, put heat on the affected area. Use the heat source that your doctor tells you to use, such as a moist heat pack or a heating pad. °? Place a towel between your skin and the heat source. °? Leave the heat on for 20-30 minutes. °? Remove the heat if your skin turns bright red. This is very important if you are   unable to feel pain, heat, or cold. You may have a greater risk of getting burned. °Activity ° °· Return to your normal activities as told by your doctor. Ask your doctor what activities are safe for you. °· Avoid activities that make your symptoms worse. °· Take short rests during the day. °? When you rest for a long time, do some physical activity or stretching between periods of rest. °? Avoid sitting for a long time without moving. Get up and move around at least one time each hour. °· Exercise and stretch regularly, as told by your doctor. °· Do not lift anything that is heavier than 10 lb (4.5 kg)  while you have symptoms of sciatica. °? Avoid lifting heavy things even when you do not have symptoms. °? Avoid lifting heavy things over and over. °· When you lift objects, always lift in a way that is safe for your body. To do this, you should: °? Bend your knees. °? Keep the object close to your body. °? Avoid twisting. °General instructions °· Stay at a healthy weight. °· Wear comfortable shoes that support your feet. Avoid wearing high heels. °· Avoid sleeping on a mattress that is too soft or too hard. You might have less pain if you sleep on a mattress that is firm enough to support your back. °· Keep all follow-up visits as told by your doctor. This is important. °Contact a doctor if: °· You have pain that: °? Wakes you up when you are sleeping. °? Gets worse when you lie down. °? Is worse than the pain you have had in the past. °? Lasts longer than 4 weeks. °· You lose weight without trying. °Get help right away if: °· You cannot control when you pee (urinate) or poop (have a bowel movement). °· You have weakness in any of these areas and it gets worse: °? Lower back. °? The area between your hip bones. °? Butt. °? Legs. °· You have redness or swelling of your back. °· You have a burning feeling when you pee. °Summary °· Sciatica is pain, weakness, tingling, or loss of feeling (numbness) along the sciatic nerve. °· This condition happens when the sciatic nerve is pinched or has pressure put on it. °· Sciatica can cause pain, tingling, or loss of feeling (numbness) in the lower back, legs, hips, and butt. °· Treatment often includes rest, exercise, medicines, and putting ice or heat on the affected area. °This information is not intended to replace advice given to you by your health care provider. Make sure you discuss any questions you have with your health care provider. °Document Released: 11/05/2007 Document Revised: 02/14/2018 Document Reviewed: 02/14/2018 °Elsevier Patient Education © 2020 [employer]  [employer] ° °

## 2018-09-21 NOTE — Progress Notes (Signed)
Subjective:    Janet Gardner is a 63 y.o. female who presents with right hip pain. Onset of the symptoms was about 1 year ago. Inciting event: sitting on a riding lawn mower. The patient reports the hip pain is worse with weight bearing, is aggravated by walking and comes and goes. Aggravating symptoms include: any weight bearing, going up and down stairs, pivoting, rising after sitting, standing and walking. Patient has had prior hip problems. Previous visits for this problem: yes, seen by Dr. Lysbeth GalasNyland. Evaluation to date: steroids and NSAIDs. Treatment to date: have been beneficial.  Pt states she has flares every now and then. She has pain that starts in her right buttock / hip that runs down her leg. No new injury. No loss of function, bowel or bladder incontinence, saddle anesthesia, or weakness.   The following portions of the patient's history were reviewed and updated as appropriate: allergies, current medications, past family history, past medical history, past social history, past surgical history and problem list.   Review of Systems Constitutional: negative Eyes: negative Ears, nose, mouth, throat, and face: negative Respiratory: negative Cardiovascular: negative Gastrointestinal: negative Genitourinary:negative Musculoskeletal:positive for arthralgias and myalgias Neurological: positive for tingling in right thigh   Objective:    BP (!) 148/78   Pulse 84   Temp (!) 96.6 F (35.9 C)   Ht 5\' 9"  (1.753 m)   Wt 220 lb (99.8 kg)   BMI 32.49 kg/m  Physical Exam  Constitutional: She is oriented to person, place, and time. She appears distressed (mild).  HENT:  Head: Normocephalic and atraumatic.  Eyes: Pupils are equal, round, and reactive to light. Conjunctivae are normal.  Neck: Normal range of motion. Neck supple.  Cardiovascular: Normal rate, regular rhythm and intact distal pulses.  Pulmonary/Chest: Effort normal and breath sounds normal. No respiratory distress.   Musculoskeletal:     Right hip: She exhibits tenderness (right SI joint tenderness). She exhibits normal range of motion, normal strength, no bony tenderness, no swelling, no crepitus, no deformity and no laceration.     Left hip: Normal.     Right knee: Normal.     Thoracic back: Normal.     Lumbar back: Normal.       Back:  Neurological: She is alert and oriented to person, place, and time. Gait normal.  Skin: Skin is warm and dry. She is not diaphoretic.  Psychiatric: Mood, memory, affect and judgment normal.     Assessment:   Lawson FiscalLori was seen today for right hip pain.  Diagnoses and all orders for this visit:  Right sided sciatica Signs and symptoms consistent with right sciatica. No new injury, no red flags. Pt has not had PT for sciatica, will refer today. Medications as prescribed. Follow up in 6 weeks. Report any new or worsening symptoms.  -     Ambulatory referral to Physical Therapy -     predniSONE (DELTASONE) 20 MG tablet; 2 po at sametime daily for 5 days -     meloxicam (MOBIC) 7.5 MG tablet; Take 1 tablet (7.5 mg total) by mouth daily -     methylPREDNISolone acetate (DEPO-MEDROL) injection 40 mg     Plan:    Natural history and expected course discussed. Questions answered. Transport plannerducational materials distributed. NSAIDs per medication orders. PT referral. Follow up in 6 weeks.    The above assessment and management plan was discussed with the patient. The patient verbalized understanding of and has agreed to the management plan.  Patient is aware to call the clinic if symptoms fail to improve or worsen. Patient is aware when to return to the clinic for a follow-up visit. Patient educated on when it is appropriate to go to the emergency department.   Monia Pouch, FNP-C [facility] [address] [place], [place] [address] [phone]

## 2018-10-13 ENCOUNTER — Ambulatory Visit (INDEPENDENT_AMBULATORY_CARE_PROVIDER_SITE_OTHER): Payer: Medicare HMO | Admitting: *Deleted

## 2018-10-13 ENCOUNTER — Other Ambulatory Visit: Payer: Self-pay

## 2018-10-13 DIAGNOSIS — Z23 Encounter for immunization: Secondary | ICD-10-CM | POA: Diagnosis not present

## 2018-10-26 ENCOUNTER — Encounter: Payer: Self-pay | Admitting: *Deleted

## 2018-10-28 ENCOUNTER — Encounter: Payer: Self-pay | Admitting: Family Medicine

## 2018-10-28 ENCOUNTER — Other Ambulatory Visit: Payer: Self-pay

## 2018-10-28 ENCOUNTER — Ambulatory Visit (INDEPENDENT_AMBULATORY_CARE_PROVIDER_SITE_OTHER): Payer: Medicare HMO | Admitting: Family Medicine

## 2018-10-28 VITALS — BP 138/73 | HR 67 | Temp 98.6°F | Resp 18 | Ht 69.0 in | Wt 220.0 lb

## 2018-10-28 DIAGNOSIS — M5431 Sciatica, right side: Secondary | ICD-10-CM

## 2018-10-28 MED ORDER — KETOROLAC TROMETHAMINE 60 MG/2ML IM SOLN
30.0000 mg | Freq: Once | INTRAMUSCULAR | Status: AC
  Administered 2018-10-28: 30 mg via INTRAMUSCULAR

## 2018-10-28 MED ORDER — METHYLPREDNISOLONE ACETATE 40 MG/ML IJ SUSP: 40.0000 mg | Freq: Once | INTRAMUSCULAR | Status: DC

## 2018-10-28 MED ORDER — METHYLPREDNISOLONE ACETATE 80 MG/ML IJ SUSP
40.0000 mg | Freq: Once | INTRAMUSCULAR | Status: AC
  Administered 2018-10-28: 40 mg via INTRAMUSCULAR

## 2018-10-28 NOTE — Patient Instructions (Addendum)
Sciatica Rehab Ask your health care provider which exercises are safe for you. Do exercises exactly as told by your health care provider and adjust them as directed. It is normal to feel mild stretching, pulling, tightness, or discomfort as you do these exercises. Stop right away if you feel sudden pain or your pain gets worse. Do not begin these exercises until told by your health care provider. Stretching and range-of-motion exercises These exercises warm up your muscles and joints and improve the movement and flexibility of your hips and back. These exercises also help to relieve pain, numbness, and tingling. Sciatic nerve glide Sit in a chair with your head facing down toward your chest. Place your hands behind your back. Let your shoulders slump forward. Slowly straighten one of your legs while you tilt your head back as if you are looking toward the ceiling. Only straighten your leg as far as you can without making your symptoms worse. Hold this position for __________ seconds. Slowly return to the starting position. Repeat with your other leg. Repeat __________ times. Complete this exercise __________ times a day. Knee to chest with hip adduction and internal rotation  Lie on your back on a firm surface with both legs straight. Bend one of your knees and move it up toward your chest until you feel a gentle stretch in your lower back and buttock. Then, move your knee toward the shoulder that is on the opposite side from your leg. This is hip adduction and internal rotation. Hold your leg in this position by holding on to the front of your knee. Hold this position for __________ seconds. Slowly return to the starting position. Repeat with your other leg. Repeat __________ times. Complete this exercise __________ times a day. Prone extension on elbows  Lie on your abdomen on a firm surface. A bed may be too soft for this exercise. Prop yourself up on your elbows. Use your arms to help  lift your chest up until you feel a gentle stretch in your abdomen and your lower back. This will place some of your body weight on your elbows. If this is uncomfortable, try stacking pillows under your chest. Your hips should stay down, against the surface that you are lying on. Keep your hip and back muscles relaxed. Hold this position for __________ seconds. Slowly relax your upper body and return to the starting position. Repeat __________ times. Complete this exercise __________ times a day. Strengthening exercises These exercises build strength and endurance in your back. Endurance is the ability to use your muscles for a long time, even after they get tired. Pelvic tilt This exercise strengthens the muscles that lie deep in the abdomen. Lie on your back on a firm surface. Bend your knees and keep your feet flat on the floor. Tense your abdominal muscles. Tip your pelvis up toward the ceiling and flatten your lower back into the floor. To help with this exercise, you may place a small towel under your lower back and try to push your back into the towel. Hold this position for __________ seconds. Let your muscles relax completely before you repeat this exercise. Repeat __________ times. Complete this exercise __________ times a day. Alternating arm and leg raises  Get on your hands and knees on a firm surface. If you are on a hard floor, you may want to use padding, such as an exercise mat, to cushion your knees. Line up your arms and legs. Your hands should be directly below your shoulders,   and your knees should be directly below your hips. Lift your left leg behind you. At the same time, raise your right arm and straighten it in front of you. Do not lift your leg higher than your hip. Do not lift your arm higher than your shoulder. Keep your abdominal and back muscles tight. Keep your hips facing the ground. Do not arch your back. Keep your balance carefully, and do not hold your  breath. Hold this position for __________ seconds. Slowly return to the starting position. Repeat with your right leg and your left arm. Repeat __________ times. Complete this exercise __________ times a day. Posture and body mechanics Good posture and healthy body mechanics can help to relieve stress in your body's tissues and joints. Body mechanics refers to the movements and positions of your body while you do your daily activities. Posture is part of body mechanics. Good posture means: Your spine is in its natural S-curve position (neutral). Your shoulders are pulled back slightly. Your head is not tipped forward. Follow these guidelines to improve your posture and body mechanics in your everyday activities. Standing  When standing, keep your spine neutral and your feet about hip width apart. Keep a slight bend in your knees. Your ears, shoulders, and hips should line up. When you do a task in which you stand in one place for a long time, place one foot up on a stable object that is 2-4 inches (5-10 cm) high, such as a footstool. This helps keep your spine neutral. Sitting  When sitting, keep your spine neutral and keep your feet flat on the floor. Use a footrest, if necessary, and keep your thighs parallel to the floor. Avoid rounding your shoulders, and avoid tilting your head forward. When working at a desk or a computer, keep your desk at a height where your hands are slightly lower than your elbows. Slide your chair under your desk so you are close enough to maintain good posture. When working at a computer, place your monitor at a height where you are looking straight ahead and you do not have to tilt your head forward or downward to look at the screen. Resting When lying down and resting, avoid positions that are most painful for you. If you have pain with activities such as sitting, bending, stooping, or squatting, lie in a position in which your body does not bend very much. For  example, avoid curling up on your side with your arms and knees near your chest (fetal position). If you have pain with activities such as standing for a long time or reaching with your arms, lie with your spine in a neutral position and bend your knees slightly. Try the following positions: Lying on your side with a pillow between your knees. Lying on your back with a pillow under your knees. Lifting  When lifting objects, keep your feet at least shoulder width apart and tighten your abdominal muscles. Bend your knees and hips and keep your spine neutral. It is important to lift using the strength of your legs, not your back. Do not lock your knees straight out. Always ask for help to lift heavy or awkward objects. This information is not intended to replace advice given to you by your health care provider. Make sure you discuss any questions you have with your health care provider. Document Released: 01/26/2005 Document Revised: 05/20/2018 Document Reviewed: 02/17/2018 Elsevier Patient Education  2020 [employer] Sciatica  Sciatica is pain, weakness, tingling, or loss of  feeling (numbness) along the sciatic nerve. The sciatic nerve starts in the lower back and goes down the back of each leg. Sciatica usually goes away on its own or with treatment. Sometimes, sciatica may come back (recur). What are the causes? This condition happens when the sciatic nerve is pinched or has pressure put on it. This may be the result of:  A disk in between the bones of the spine bulging out too far (herniated disk).  Changes in the spinal disks that occur with aging.  A condition that affects a muscle in the butt.  Extra bone growth near the sciatic nerve.  A break (fracture) of the area between your hip bones (pelvis).  Pregnancy.  Tumor. This is rare. What increases the risk? You are more likely to develop this condition if you:  Play sports that put pressure or stress on the spine.  Have  poor strength and ease of movement (flexibility).  Have had a back injury in the past.  Have had back surgery.  Sit for long periods of time.  Do activities that involve bending or lifting over and over again.  Are very overweight (obese). What are the signs or symptoms? Symptoms can vary from mild to very bad. They may include:  Any of these problems in the lower back, leg, hip, or butt: ? Mild tingling, loss of feeling, or dull aches. ? Burning sensations. ? Sharp pains.  Loss of feeling in the back of the calf or the sole of the foot.  Leg weakness.  Very bad back pain that makes it hard to move. These symptoms may get worse when you cough, sneeze, or laugh. They may also get worse when you sit or stand for long periods of time. How is this treated? This condition often gets better without any treatment. However, treatment may include:  Changing or cutting back on physical activity when you have pain.  Doing exercises and stretching.  Putting ice or heat on the affected area.  Medicines that help: ? To relieve pain and swelling. ? To relax your muscles.  Shots (injections) of medicines that help to relieve pain, irritation, and swelling.  Surgery. Follow these instructions at home: Medicines  Take over-the-counter and prescription medicines only as told by your doctor.  Ask your doctor if the medicine prescribed to you: ? Requires you to avoid driving or using heavy machinery. ? Can cause trouble pooping (constipation). You may need to take these steps to prevent or treat trouble pooping:  Drink enough fluids to keep your pee (urine) pale yellow.  Take over-the-counter or prescription medicines.  Eat foods that are high in fiber. These include beans, whole grains, and fresh fruits and vegetables.  Limit foods that are high in fat and sugar. These include fried or sweet foods. Managing pain      If told, put ice on the affected area. ? Put ice in a  plastic bag. ? Place a towel between your skin and the bag. ? Leave the ice on for 20 minutes, 2-3 times a day.  If told, put heat on the affected area. Use the heat source that your doctor tells you to use, such as a moist heat pack or a heating pad. ? Place a towel between your skin and the heat source. ? Leave the heat on for 20-30 minutes. ? Remove the heat if your skin turns bright red. This is very important if you are unable to feel pain, heat, or cold. You may  have a greater risk of getting burned. Activity   Return to your normal activities as told by your doctor. Ask your doctor what activities are safe for you.  Avoid activities that make your symptoms worse.  Take short rests during the day. ? When you rest for a long time, do some physical activity or stretching between periods of rest. ? Avoid sitting for a long time without moving. Get up and move around at least one time each hour.  Exercise and stretch regularly, as told by your doctor.  Do not lift anything that is heavier than 10 lb (4.5 kg) while you have symptoms of sciatica. ? Avoid lifting heavy things even when you do not have symptoms. ? Avoid lifting heavy things over and over.  When you lift objects, always lift in a way that is safe for your body. To do this, you should: ? Bend your knees. ? Keep the object close to your body. ? Avoid twisting. General instructions  Stay at a healthy weight.  Wear comfortable shoes that support your feet. Avoid wearing high heels.  Avoid sleeping on a mattress that is too soft or too hard. You might have less pain if you sleep on a mattress that is firm enough to support your back.  Keep all follow-up visits as told by your doctor. This is important. Contact a doctor if:  You have pain that: ? Wakes you up when you are sleeping. ? Gets worse when you lie down. ? Is worse than the pain you have had in the past. ? Lasts longer than 4 weeks.  You lose weight  without trying. Get help right away if:  You cannot control when you pee (urinate) or poop (have a bowel movement).  You have weakness in any of these areas and it gets worse: ? Lower back. ? The area between your hip bones. ? Butt. ? Legs.  You have redness or swelling of your back.  You have a burning feeling when you pee. Summary  Sciatica is pain, weakness, tingling, or loss of feeling (numbness) along the sciatic nerve.  This condition happens when the sciatic nerve is pinched or has pressure put on it.  Sciatica can cause pain, tingling, or loss of feeling (numbness) in the lower back, legs, hips, and butt.  Treatment often includes rest, exercise, medicines, and putting ice or heat on the affected area. This information is not intended to replace advice given to you by your health care provider. Make sure you discuss any questions you have with your health care provider. Document Released: 11/05/2007 Document Revised: 02/14/2018 Document Reviewed: 02/14/2018 Elsevier Patient Education  2020 [employer].

## 2018-10-28 NOTE — Progress Notes (Signed)
Subjective:    Janet Gardner is a 63 y.o. female who presents with right hip pain. Onset of the symptoms was several months ago. Inciting event: none. The patient reports the hip pain is worse with weight bearing, is aggravated by walking and radiates to down her right leg. Aggravating symptoms include: any weight bearing, going up and down stairs, pivoting, rising after sitting, squatting and walking. Patient has had prior hip problems. Previous visits for this problem: yes, last seen several weeks ago by the patient's PCP. Evaluation to date: none. Treatment to date: OTC analgesics, which have been ineffective, prescription analgesics, which have been lost its effectiveness 1 day ago and referral to PT, pt has not attended, unable to afford. .  The following portions of the patient's history were reviewed and updated as appropriate: allergies, current medications, past family history, past medical history, past social history, past surgical history and problem list.   Review of Systems Constitutional: negative Respiratory: negative Cardiovascular: negative Integument/breast: negative Musculoskeletal:positive for arthralgias Neurological: negative   Objective:    BP 138/73 (BP Location: Left Arm, Cuff Size: Large)   Pulse 67   Temp 98.6 F (37 C)   Resp 18   Ht 5\' 9"  (1.753 m)   Wt 220 lb (99.8 kg)   SpO2 98%   BMI 32.49 kg/m  Physical Exam  Constitutional: She is oriented to person, place, and time and well-developed, well-nourished, and in no distress. No distress.  HENT:  Head: Normocephalic and atraumatic.  Eyes: Pupils are equal, round, and reactive to light.  Neck: Normal range of motion. Neck supple.  Cardiovascular: Normal rate and intact distal pulses. Exam reveals no gallop and no friction rub.  No murmur heard. Pulmonary/Chest: Effort normal and breath sounds normal. No respiratory distress.  Musculoskeletal:     Right hip: She exhibits decreased range of motion  and tenderness. She exhibits normal strength, no bony tenderness, no swelling, no crepitus, no deformity and no laceration.     Right knee: Normal.     Lumbar back: Normal.       Legs:     Comments: Right straight leg raise test positive at 35 degrees.  Neurological: She is alert and oriented to person, place, and time. Gait (antalgic gait) abnormal.  Skin: Skin is warm and dry. No rash noted. She is not diaphoretic. No erythema.  Psychiatric: Mood, memory, affect and judgment normal.    Assessment:   Aleesha was seen today for right hip pain.  Diagnoses and all orders for this visit:  Right sided sciatica Ongoing right sided sciatica. No new injuries. Has not been able to go to PT due to cost. Has not been taking Meloxicam as prescribed, will restart as importance of this medication was discussed in detail. Will give IM steroids and Toradol injection today. Home exercises discussed in detail and handout provided. Referral to ortho. Report any new or worsening symptoms. Follow up in 6 weeks or sooner if needed.  -     ketorolac (TORADOL) injection 30 mg -     Ambulatory referral to Orthopedic Surgery -     methylPREDNISolone acetate (DEPO-MEDROL) injection 40 mg     Plan:    Natural history and expected course discussed. Questions answered. Scientist, clinical (histocompatibility and immunogenetics) distributed. Home exercises discussed. NSAIDs per medication orders. Orthopedics referral. PT referral. Follow up in 6 weeks.    The above assessment and management plan was discussed with the patient. The patient verbalized understanding of and has  agreed to the management plan. Patient is aware to call the clinic if they develop any new symptoms or if symptoms fail to improve or worsen. Patient is aware when to return to the clinic for a follow-up visit. Patient educated on when it is appropriate to go to the emergency department.    Kari BaarsMichelle Ashvik Grundman, FNP-C [facility] [address]  [place], [place] [id number] [phone]

## 2018-11-21 ENCOUNTER — Ambulatory Visit (INDEPENDENT_AMBULATORY_CARE_PROVIDER_SITE_OTHER): Payer: Medicare HMO | Admitting: Internal Medicine

## 2018-12-06 ENCOUNTER — Ambulatory Visit: Payer: Medicare HMO | Admitting: Family Medicine

## 2018-12-07 ENCOUNTER — Encounter: Payer: Self-pay | Admitting: Family Medicine

## 2018-12-07 ENCOUNTER — Other Ambulatory Visit: Payer: Self-pay

## 2018-12-07 ENCOUNTER — Ambulatory Visit (INDEPENDENT_AMBULATORY_CARE_PROVIDER_SITE_OTHER): Payer: Medicare HMO | Admitting: Family Medicine

## 2018-12-07 VITALS — BP 138/83 | HR 81 | Temp 96.4°F | Resp 20 | Ht 69.0 in | Wt 221.0 lb

## 2018-12-07 DIAGNOSIS — E559 Vitamin D deficiency, unspecified: Secondary | ICD-10-CM | POA: Diagnosis not present

## 2018-12-07 DIAGNOSIS — E039 Hypothyroidism, unspecified: Secondary | ICD-10-CM

## 2018-12-07 DIAGNOSIS — E538 Deficiency of other specified B group vitamins: Secondary | ICD-10-CM | POA: Diagnosis not present

## 2018-12-07 DIAGNOSIS — E785 Hyperlipidemia, unspecified: Secondary | ICD-10-CM

## 2018-12-07 DIAGNOSIS — M797 Fibromyalgia: Secondary | ICD-10-CM

## 2018-12-07 MED ORDER — CYANOCOBALAMIN 1000 MCG/ML IJ SOLN
1000.0000 ug | INTRAMUSCULAR | Status: DC
  Administered 2018-12-07 – 2019-01-09 (×2): 1000 ug via INTRAMUSCULAR

## 2018-12-07 NOTE — Progress Notes (Addendum)
Subjective:  Patient ID: Janet Gardner, female    DOB: 1955/08/04, 63 y.o.   MRN: [id number]  Patient Care Team: Baruch Gouty, FNP as PCP - General (Family Medicine) Gala Romney Cristopher Estimable, MD as Consulting Physician (Gastroenterology)   Chief Complaint:  Medical Management of Chronic Issues (3 mo ) and Hypothyroidism   HPI: Janet Gardner is a 63 y.o. female presenting on 12/07/2018 for Medical Management of Chronic Issues (3 mo ) and Hypothyroidism  1. Dyslipidemia Compliant with atorvastatin and tolerating well. Does try to watch diet. Does not exercise.   Lab Results  Component Value Date   CHOL 177 09/02/2018   HDL 42 09/02/2018   LDLCALC 113 (H) 09/02/2018   TRIG 111 09/02/2018   CHOLHDL 4.2 09/02/2018     Family and personal medical history reviewed. Smoking and ETOH history reviewed.    2. Acquired hypothyroidism Compliant with medications - Yes Current medications - synthroid Adverse side effects - No Weight - stable  Bowel habit changes - No Heat or cold intolerance - Yes Mood changes - No Changes in sleep habits - No Fatigue - No Skin, hair, or nail changes - No Tremor - No Palpitations - No Edema - No Shortness of breath - No  Lab Results  Component Value Date   TSH 0.915 09/02/2018     3. Vitamin B12 deficiency On monthly repletion therapy. Tolerating well. No fatigue, weakness, paresthesias, or cheilosis.   4. Vitamin D deficiency Pt is taking oral repletion therapy. Denies bone pain and tenderness, muscle weakness, fracture, and difficulty walking. Lab Results  Component Value Date   VD25OH 26.9 (L) 09/02/2018   Lab Results  Component Value Date   CALCIUM 8.7 09/02/2018      5. Fibromyalgia Well controlled with meloxicam. No increased symptoms. Does complain of tenderness with palpation.      Relevant past medical, surgical, family, and social history reviewed and updated as indicated.  Allergies and medications reviewed and updated.  Date reviewed: Chart in Epic.   Past Medical History:  Diagnosis Date  . B12 deficiency    monthly injection  . Fibromyalgia   . GERD (gastroesophageal reflux disease)   . Hypothyroidism     Past Surgical History:  Procedure Laterality Date  . ABDOMINAL HYSTERECTOMY    . CHOLECYSTECTOMY    . colonoscopy  2007   Dr. Lavone Neri, incomplete due to poor prep. scope passed to transverse colon. ACBE normal.  . ESOPHAGOGASTRODUODENOSCOPY  07/22/2009   QZE:[id number] apperaring esophagus s/p 56 dilator/small HH/large ulcerated gastric polyp in the prepyloric antral area. Inflammatory fibroid polyp.  . ESOPHAGOGASTRODUODENOSCOPY (EGD) WITH ESOPHAGEAL DILATION N/A 11/24/2012   Procedure: ESOPHAGOGASTRODUODENOSCOPY (EGD) WITH ESOPHAGEAL DILATION;  Surgeon: Daneil Dolin, MD;  Location: [facility];  Service: Endoscopy;  Laterality: N/A;  9:30AM  . FOOT SURGERY     Right  . KIDNEY SURGERY      Social History   Socioeconomic History  . Marital status: Married    Spouse name: Not on file  . Number of children: 1  . Years of education: Not on file  . Highest education level: Not on file  Occupational History  . Not on file  Social Needs  . Financial resource strain: Not on file  . Food insecurity    Worry: Not on file    Inability: Not on file  . Transportation needs    Medical: Not on file    Non-medical: Not on file  Tobacco Use  . Smoking status: Never Smoker  . Smokeless tobacco: Never Used  Substance and Sexual Activity  . Alcohol use: No  . Drug use: No  . Sexual activity: Not on file  Lifestyle  . Physical activity    Days per week: Not on file    Minutes per session: Not on file  . Stress: Not on file  Relationships  . Social Herbalist on phone: Not on file    Gets together: Not on file    Attends religious service: Not on file    Active member of club or organization: Not on file    Attends meetings of clubs or organizations: Not on file    Relationship  status: Not on file  . Intimate partner violence    Fear of current or ex partner: Not on file    Emotionally abused: Not on file    Physically abused: Not on file    Forced sexual activity: Not on file  Other Topics Concern  . Not on file  Social History Narrative  . Not on file    Outpatient Encounter Medications as of 12/07/2018  Medication Sig  . atorvastatin (LIPITOR) 20 MG tablet Take 1 tablet (20 mg total) by mouth daily.  . calcium-vitamin D (OSCAL WITH D) 500-200 MG-UNIT tablet Take 1 tablet by mouth daily with breakfast.  . Cholecalciferol (VITAMIN D3) 1.25 MG (50000 UT) TABS Take by mouth once a week.  . levothyroxine (SYNTHROID, LEVOTHROID) 112 MCG tablet Take 112 mcg by mouth daily.    . meloxicam (MOBIC) 7.5 MG tablet Take 1 tablet (7.5 mg total) by mouth daily.  . [DISCONTINUED] cyanocobalamin (,VITAMIN B-12,) 1000 MCG/ML injection Inject 1,000 mcg into the muscle every 30 (thirty) days.   Facility-Administered Encounter Medications as of 12/07/2018  Medication  . cyanocobalamin ((VITAMIN B-12)) injection 1,000 mcg    Allergies  Allergen Reactions  . Levaquin [Levofloxacin] Nausea And Vomiting  . Sulfa Antibiotics     Vomiting    Review of Systems  Constitutional: Negative for activity change, appetite change, chills, diaphoresis, fatigue, fever and unexpected weight change.  HENT: Negative.   Eyes: Negative.  Negative for photophobia and visual disturbance.  Respiratory: Negative for cough, chest tightness and shortness of breath.   Cardiovascular: Negative for chest pain, palpitations and leg swelling.  Gastrointestinal: Negative for abdominal pain, blood in stool, constipation, diarrhea, nausea and vomiting.  Endocrine: Positive for cold intolerance. Negative for heat intolerance, polydipsia, polyphagia and polyuria.  Genitourinary: Negative for decreased urine volume, difficulty urinating, dysuria, frequency and urgency.  Musculoskeletal: Positive for  arthralgias and myalgias. Negative for back pain, gait problem, joint swelling and neck stiffness.  Skin: Negative.  Negative for color change and pallor.  Allergic/Immunologic: Negative.   Neurological: Negative for dizziness, tremors, seizures, syncope, facial asymmetry, speech difficulty, weakness, light-headedness, numbness and headaches.  Hematological: Negative.  Does not bruise/bleed easily.  Psychiatric/Behavioral: Negative for confusion, hallucinations, sleep disturbance and suicidal ideas. The patient is not nervous/anxious.   All other systems reviewed and are negative.       Objective:  BP 138/83   Pulse 81   Temp (!) 96.4 F (35.8 C)   Resp 20   Ht '5\' 9"'  (1.753 m)   Wt 221 lb (100.2 kg)   SpO2 99%   BMI 32.64 kg/m    Wt Readings from Last 3 Encounters:  12/07/18 221 lb (100.2 kg)  10/28/18 220 lb (99.8 kg)  09/21/18 220  lb (99.8 kg)    Physical Exam Vitals and nursing note reviewed.  Constitutional:      General: She is not in acute distress.    Appearance: Normal appearance. She is well-developed and well-groomed. She is obese. She is not ill-appearing, toxic-appearing or diaphoretic.  HENT:     Head: Normocephalic and atraumatic.     Jaw: There is normal jaw occlusion.     Right Ear: Hearing normal.     Left Ear: Hearing normal.     Nose: Nose normal.     Mouth/Throat:     Lips: Pink.     Mouth: Mucous membranes are moist.     Pharynx: Oropharynx is clear. Uvula midline.  Eyes:     General: Lids are normal.     Extraocular Movements: Extraocular movements intact.     Conjunctiva/sclera: Conjunctivae normal.     Pupils: Pupils are equal, round, and reactive to light.  Neck:     Thyroid: No thyroid mass, thyromegaly or thyroid tenderness.     Vascular: No carotid bruit or JVD.     Trachea: Trachea and phonation normal.  Cardiovascular:     Rate and Rhythm: Normal rate and regular rhythm.     Chest Wall: PMI is not displaced.     Pulses: Normal  pulses.     Heart sounds: Normal heart sounds. No murmur. No friction rub. No gallop.   Pulmonary:     Effort: Pulmonary effort is normal. No respiratory distress.     Breath sounds: Normal breath sounds. No wheezing.  Abdominal:     General: Bowel sounds are normal. There is no distension or abdominal bruit.     Palpations: Abdomen is soft. There is no hepatomegaly or splenomegaly.     Tenderness: There is no abdominal tenderness. There is no right CVA tenderness or left CVA tenderness.     Hernia: No hernia is present.  Musculoskeletal:        General: Normal range of motion.     Cervical back: Normal range of motion and neck supple.     Right lower leg: No edema.     Left lower leg: No edema.  Lymphadenopathy:     Cervical: No cervical adenopathy.  Skin:    General: Skin is warm and dry.     Capillary Refill: Capillary refill takes less than 2 seconds.     Coloration: Skin is not cyanotic, jaundiced or pale.     Findings: No rash.  Neurological:     General: No focal deficit present.     Mental Status: She is alert and oriented to person, place, and time.     Cranial Nerves: Cranial nerves are intact.     Sensory: Sensation is intact.     Motor: Motor function is intact.     Coordination: Coordination is intact.     Gait: Gait is intact.     Deep Tendon Reflexes: Reflexes are normal and symmetric.  Psychiatric:        Attention and Perception: Attention and perception normal.        Mood and Affect: Mood and affect normal.        Speech: Speech normal.        Behavior: Behavior normal. Behavior is cooperative.        Thought Content: Thought content normal.        Cognition and Memory: Cognition and memory normal.        Judgment: Judgment normal.  Results for orders placed or performed in visit on 09/02/18  CBC with Differential/Platelet  Result Value Ref Range   WBC 5.3 3.4 - 10.8 x10E3/uL   RBC 4.82 3.77 - 5.28 x10E6/uL   Hemoglobin 12.9 11.1 - 15.9 g/dL    Hematocrit 40.3 34.0 - 46.6 %   MCV 84 79 - 97 fL   MCH 26.8 26.6 - 33.0 pg   MCHC 32.0 31.5 - 35.7 g/dL   RDW 13.2 11.7 - 15.4 %   Platelets 243 150 - 450 x10E3/uL   Neutrophils 67 Not Estab. %   Lymphs 21 Not Estab. %   Monocytes 8 Not Estab. %   Eos 3 Not Estab. %   Basos 1 Not Estab. %   Neutrophils Absolute 3.5 1.4 - 7.0 x10E3/uL   Lymphocytes Absolute 1.1 0.7 - 3.1 x10E3/uL   Monocytes Absolute 0.4 0.1 - 0.9 x10E3/uL   EOS (ABSOLUTE) 0.2 0.0 - 0.4 x10E3/uL   Basophils Absolute 0.0 0.0 - 0.2 x10E3/uL   Immature Granulocytes 0 Not Estab. %   Immature Grans (Abs) 0.0 0.0 - 0.1 x10E3/uL  CMP14+EGFR  Result Value Ref Range   Glucose 82 65 - 99 mg/dL   BUN 10 8 - 27 mg/dL   Creatinine, Ser 0.64 0.57 - 1.00 mg/dL   GFR calc non Af Amer 94 >59 mL/min/1.73   GFR calc Af Amer 108 >59 mL/min/1.73   BUN/Creatinine Ratio 16 12 - 28   Sodium 140 134 - 144 mmol/L   Potassium 4.4 3.5 - 5.2 mmol/L   Chloride 103 96 - 106 mmol/L   CO2 23 20 - 29 mmol/L   Calcium 8.7 8.7 - 10.3 mg/dL   Total Protein 6.9 6.0 - 8.5 g/dL   Albumin 3.7 (L) 3.8 - 4.8 g/dL   Globulin, Total 3.2 1.5 - 4.5 g/dL   Albumin/Globulin Ratio 1.2 1.2 - 2.2   Bilirubin Total 0.2 0.0 - 1.2 mg/dL   Alkaline Phosphatase 74 39 - 117 IU/L   AST 21 0 - 40 IU/L   ALT 15 0 - 32 IU/L  Lipid panel  Result Value Ref Range   Cholesterol, Total 177 100 - 199 mg/dL   Triglycerides 111 0 - 149 mg/dL   HDL 42 >39 mg/dL   VLDL Cholesterol Cal 22 5 - 40 mg/dL   LDL Calculated 113 (H) 0 - 99 mg/dL   Chol/HDL Ratio 4.2 0.0 - 4.4 ratio  Thyroid Panel With TSH  Result Value Ref Range   TSH 0.915 0.450 - 4.500 uIU/mL   T4, Total 11.4 4.5 - 12.0 ug/dL   T3 Uptake Ratio 25 24 - 39 %   Free Thyroxine Index 2.9 1.2 - 4.9  Vitamin B12  Result Value Ref Range   Vitamin B-12 >2000 (H) 232 - 1245 pg/mL  VITAMIN D 25 Hydroxy (Vit-D Deficiency, Fractures)  Result Value Ref Range   Vit D, 25-Hydroxy 26.9 (L) 30.0 - 100.0 ng/mL        Pertinent labs & imaging results that were available during my care of the patient were reviewed by me and considered in my medical decision making.  Assessment & Plan:  Yaretzy was seen today for medical management of chronic issues and hypothyroidism.  Diagnoses and all orders for this visit:  Dyslipidemia Diet encouraged - increase intake of fresh fruits and vegetables, increase intake of lean proteins. Bake, broil, or grill foods. Avoid fried, greasy, and fatty foods. Avoid fast foods. Increase intake of fiber-rich whole grains.  Exercise encouraged - at least 150 minutes per week and advance as tolerated.  Goal BMI < 25. Continue medications as prescribed. Follow up in 3-6 months as discussed.   Acquired hypothyroidism Thyroid disease has been well controlled. Labs at next visit. Adjustments to regimen will be made if warranted. Make sure to take medications on an empty stomach with a full glass of water. Make sure to avoid vitamins or supplements for at least 4 hours before and 4 hours after taking medications. Repeat labs in 3 months if adjustments are made and in 6 months if stable.    Vitamin B12 deficiency Doing well on repletion therapy. Will give IM injection today and place on nurse schedule for monthly injections.  -     cyanocobalamin ((VITAMIN B-12)) injection 1,000 mcg  Vitamin D deficiency Labs pending. Continue repletion therapy. If indicated, will change repletion dosage. Eat foods rich in Vit D including milk, orange juice, yogurt with vitamin D added, salmon or mackerel, canned tuna fish, cereals with vitamin D added, and cod liver oil. Get out in the sun but make sure to wear at least SPF 30 sunscreen.   Fibromyalgia Well controlled with meloxicam.     Continue all other maintenance medications.  Follow up plan: Return in about 3 months (around 03/09/2019), or if symptoms worsen or fail to improve, for Thyroid, Vit D, Vit B12, Lipids.  Continue healthy lifestyle  choices, including diet (rich in fruits, vegetables, and lean proteins, and low in salt and simple carbohydrates) and exercise (at least 30 minutes of moderate physical activity daily).  Educational handout given for hypothyroidism  The above assessment and management plan was discussed with the patient. The patient verbalized understanding of and has agreed to the management plan. Patient is aware to call the clinic if they develop any new symptoms or if symptoms persist or worsen. Patient is aware when to return to the clinic for a follow-up visit. Patient educated on when it is appropriate to go to the emergency department.   Monia Pouch, FNP-C [facility] [phone]

## 2018-12-07 NOTE — Patient Instructions (Signed)
Hypothyroidism  Hypothyroidism is when the thyroid gland does not make enough of certain hormones (it is underactive). The thyroid gland is a small gland located in the lower front part of the neck, just in front of the windpipe (trachea). This gland makes hormones that help control how the body uses food for energy (metabolism) as well as how the heart and brain function. These hormones also play a role in keeping your bones strong. When the thyroid is underactive, it produces too little of the hormones thyroxine (T4) and triiodothyronine (T3). What are the causes? This condition may be caused by:  Hashimoto's disease. This is a disease in which the body's disease-fighting system (immune system) attacks the thyroid gland. This is the most common cause.  Viral infections.  Pregnancy.  Certain medicines.  Birth defects.  Past radiation treatments to the head or neck for cancer.  Past treatment with radioactive iodine.  Past exposure to radiation in the environment.  Past surgical removal of part or all of the thyroid.  Problems with a gland in the center of the brain (pituitary gland).  Lack of enough iodine in the diet. What increases the risk? You are more likely to develop this condition if:  You are female.  You have a family history of thyroid conditions.  You use a medicine called lithium.  You take medicines that affect the immune system (immunosuppressants). What are the signs or symptoms? Symptoms of this condition include:  Feeling as though you have no energy (lethargy).  Not being able to tolerate cold.  Weight gain that is not explained by a change in diet or exercise habits.  Lack of appetite.  Dry skin.  Coarse hair.  Menstrual irregularity.  Slowing of thought processes.  Constipation.  Sadness or depression. How is this diagnosed? This condition may be diagnosed based on:  Your symptoms, your medical history, and a physical exam.  Blood  tests. You may also have imaging tests, such as an ultrasound or MRI. How is this treated? This condition is treated with medicine that replaces the thyroid hormones that your body does not make. After you begin treatment, it may take several weeks for symptoms to go away. Follow these instructions at home:  Take over-the-counter and prescription medicines only as told by your health care provider.  If you start taking any new medicines, tell your health care provider.  Keep all follow-up visits as told by your health care provider. This is important. ? As your condition improves, your dosage of thyroid hormone medicine may change. ? You will need to have blood tests regularly so that your health care provider can monitor your condition. Contact a health care provider if:  Your symptoms do not get better with treatment.  You are taking thyroid replacement medicine and you: ? Sweat a lot. ? Have tremors. ? Feel anxious. ? Lose weight rapidly. ? Cannot tolerate heat. ? Have emotional swings. ? Have diarrhea. ? Feel weak. Get help right away if you have:  Chest pain.  An irregular heartbeat.  A rapid heartbeat.  Difficulty breathing. Summary  Hypothyroidism is when the thyroid gland does not make enough of certain hormones (it is underactive).  When the thyroid is underactive, it produces too little of the hormones thyroxine (T4) and triiodothyronine (T3).  The most common cause is Hashimoto's disease, a disease in which the body's disease-fighting system (immune system) attacks the thyroid gland. The condition can also be caused by viral infections, medicine, pregnancy, or past   radiation treatment to the head or neck.  Symptoms may include weight gain, dry skin, constipation, feeling as though you do not have energy, and not being able to tolerate cold.  This condition is treated with medicine to replace the thyroid hormones that your body does not make. This information  is not intended to replace advice given to you by your health care provider. Make sure you discuss any questions you have with your health care provider. Document Released: 01/26/2005 Document Revised: 01/08/2017 Document Reviewed: 01/06/2017 Elsevier Patient Education  2020 [employer]  

## 2018-12-09 ENCOUNTER — Other Ambulatory Visit: Payer: Self-pay | Admitting: *Deleted

## 2018-12-09 MED ORDER — LEVOTHYROXINE SODIUM 112 MCG PO TABS: 112.0000 ug | ORAL_TABLET | Freq: Every day | ORAL | 3 refills | Status: DC

## 2019-01-09 ENCOUNTER — Other Ambulatory Visit: Payer: Self-pay

## 2019-01-09 ENCOUNTER — Ambulatory Visit (INDEPENDENT_AMBULATORY_CARE_PROVIDER_SITE_OTHER): Payer: Medicare HMO

## 2019-01-09 DIAGNOSIS — E538 Deficiency of other specified B group vitamins: Secondary | ICD-10-CM

## 2019-01-09 NOTE — Progress Notes (Signed)
Cyanocobalamin injection given to left deltoid.  Patient tolerated well. 

## 2019-01-16 ENCOUNTER — Ambulatory Visit (INDEPENDENT_AMBULATORY_CARE_PROVIDER_SITE_OTHER): Payer: Medicare HMO | Admitting: Internal Medicine

## 2019-02-01 ENCOUNTER — Ambulatory Visit (INDEPENDENT_AMBULATORY_CARE_PROVIDER_SITE_OTHER): Payer: Medicare HMO | Admitting: Family Medicine

## 2019-02-01 ENCOUNTER — Encounter: Payer: Self-pay | Admitting: Family Medicine

## 2019-02-01 DIAGNOSIS — J019 Acute sinusitis, unspecified: Secondary | ICD-10-CM

## 2019-02-01 MED ORDER — PREDNISONE 10 MG (21) PO TBPK: ORAL_TABLET | ORAL | 0 refills | Status: DC

## 2019-02-01 MED ORDER — AMOXICILLIN-POT CLAVULANATE 875-125 MG PO TABS: 1.0000 | ORAL_TABLET | Freq: Two times a day (BID) | ORAL | 0 refills | Status: DC

## 2019-02-01 NOTE — Patient Instructions (Signed)

## 2019-02-01 NOTE — Progress Notes (Signed)
Virtual Visit via Telephone Note  I connected with Janet Gardner on 02/01/19 at 5:05 PM by telephone and verified that I am speaking with the correct person using two identifiers. Janet Gardner is currently located at home and nobody is currently with her during this visit. The provider, Loman Brooklyn, FNP is located in their office at time of visit.  I discussed the limitations, risks, security and privacy concerns of performing an evaluation and management service by telephone and the availability of in person appointments. I also discussed with the patient that there may be a patient responsible charge related to this service. The patient expressed understanding and agreed to proceed.  Subjective: PCP: Baruch Gouty, FNP  Chief Complaint  Patient presents with  . Sinusitis   Patient complains of ear pain/pressure, postnasal drainage and dizziness. Additional symptoms include head congestion and facial pain/pressure. Onset of symptoms was 4 days ago, unchanged since that time. She is drinking plenty of fluids. Evaluation to date: none. Treatment to date: antihistamines and decongestants. She has a history of allergies. She does not smoke.    ROS: Per HPI  Current Outpatient Medications:  .  atorvastatin (LIPITOR) 20 MG tablet, Take 1 tablet (20 mg total) by mouth daily., Disp: 90 tablet, Rfl: 3 .  calcium-vitamin D (OSCAL WITH D) 500-200 MG-UNIT tablet, Take 1 tablet by mouth daily with breakfast., Disp: 90 tablet, Rfl: 3 .  Cholecalciferol (VITAMIN D3) 1.25 MG (50000 UT) TABS, Take by mouth once a week., Disp: , Rfl:  .  levothyroxine (SYNTHROID) 112 MCG tablet, Take 1 tablet (112 mcg total) by mouth daily., Disp: 90 tablet, Rfl: 3 .  meloxicam (MOBIC) 7.5 MG tablet, Take 1 tablet (7.5 mg total) by mouth daily., Disp: 30 tablet, Rfl: 1  Current Facility-Administered Medications:  .  cyanocobalamin ((VITAMIN B-12)) injection 1,000 mcg, 1,000 mcg, Intramuscular, Q30 days, Baruch Gouty, FNP, 1,000 mcg at 01/09/19 0630  Allergies  Allergen Reactions  . Levaquin [Levofloxacin] Nausea And Vomiting  . Sulfa Antibiotics     Vomiting   Past Medical History:  Diagnosis Date  . B12 deficiency    monthly injection  . Fibromyalgia   . GERD (gastroesophageal reflux disease)   . Hypothyroidism     Observations/Objective: A&O  No respiratory distress or wheezing audible over the phone Mood, judgement, and thought processes all WNL  Assessment and Plan: 1. Acute non-recurrent sinusitis, unspecified location - Education provided on COVID-19 and sinusitis.  Symptom management discussed.  Encourage patient to go get tested for COVID-19. - amoxicillin-clavulanate (AUGMENTIN) 875-125 MG tablet; Take 1 tablet by mouth 2 (two) times daily.  Dispense: 14 tablet; Refill: 0 - predniSONE (STERAPRED UNI-PAK 21 TAB) 10 MG (21) TBPK tablet; Use as directed on back of pill pack  Dispense: 21 tablet; Refill: 0   Follow Up Instructions:  I discussed the assessment and treatment plan with the patient. The patient was provided an opportunity to ask questions and all were answered. The patient agreed with the plan and demonstrated an understanding of the instructions.   The patient was advised to call back or seek an in-person evaluation if the symptoms worsen or if the condition fails to improve as anticipated.  The above assessment and management plan was discussed with the patient. The patient verbalized understanding of and has agreed to the management plan. Patient is aware to call the clinic if symptoms persist or worsen. Patient is aware when to return to the clinic  for a follow-up visit. Patient educated on when it is appropriate to go to the emergency department.   Time call ended: 5:10 PM  I provided 12 minutes of non-face-to-face time during this encounter.  Deliah Boston, MSN, APRN, FNP-C [facility] 02/01/19

## 2019-02-06 ENCOUNTER — Other Ambulatory Visit: Payer: Self-pay

## 2019-02-06 ENCOUNTER — Ambulatory Visit: Payer: Medicare HMO | Attending: Internal Medicine

## 2019-02-06 DIAGNOSIS — Z20828 Contact with and (suspected) exposure to other viral communicable diseases: Secondary | ICD-10-CM | POA: Diagnosis not present

## 2019-02-06 DIAGNOSIS — Z20822 Contact with and (suspected) exposure to covid-19: Secondary | ICD-10-CM

## 2019-02-08 LAB — NOVEL CORONAVIRUS, NAA: SARS-CoV-2, NAA: NOT DETECTED

## 2019-03-08 ENCOUNTER — Other Ambulatory Visit: Payer: Self-pay

## 2019-03-09 ENCOUNTER — Ambulatory Visit (INDEPENDENT_AMBULATORY_CARE_PROVIDER_SITE_OTHER): Payer: Medicare HMO | Admitting: Family Medicine

## 2019-03-09 ENCOUNTER — Other Ambulatory Visit: Payer: Self-pay

## 2019-03-09 ENCOUNTER — Encounter: Payer: Self-pay | Admitting: Family Medicine

## 2019-03-09 VITALS — BP 142/88 | HR 75 | Temp 97.0°F | Resp 20 | Ht 69.0 in | Wt 218.0 lb

## 2019-03-09 DIAGNOSIS — E039 Hypothyroidism, unspecified: Secondary | ICD-10-CM | POA: Diagnosis not present

## 2019-03-09 DIAGNOSIS — R030 Elevated blood-pressure reading, without diagnosis of hypertension: Secondary | ICD-10-CM

## 2019-03-09 DIAGNOSIS — K219 Gastro-esophageal reflux disease without esophagitis: Secondary | ICD-10-CM

## 2019-03-09 DIAGNOSIS — E785 Hyperlipidemia, unspecified: Secondary | ICD-10-CM

## 2019-03-09 DIAGNOSIS — E559 Vitamin D deficiency, unspecified: Secondary | ICD-10-CM

## 2019-03-09 DIAGNOSIS — M797 Fibromyalgia: Secondary | ICD-10-CM | POA: Diagnosis not present

## 2019-03-09 MED ORDER — VITAMIN D3 1.25 MG (50000 UT) PO TABS: 1.0000 | ORAL_TABLET | ORAL | 1 refills | Status: DC

## 2019-03-09 NOTE — Patient Instructions (Signed)
DASH Eating Plan DASH stands for "Dietary Approaches to Stop Hypertension." The DASH eating plan is a healthy eating plan that has been shown to reduce high blood pressure (hypertension). Additional health benefits may include reducing the risk of type 2 diabetes mellitus, heart disease, and stroke. The DASH eating plan may also help with weight loss.  WHAT DO I NEED TO KNOW ABOUT THE DASH EATING PLAN? For the DASH eating plan, you will follow these general guidelines:  Choose foods with a percent daily value for sodium of less than 5% (as listed on the food label).  Use salt-free seasonings or herbs instead of table salt or sea salt.  Check with your health care provider or pharmacist before using salt substitutes.  Eat lower-sodium products, often labeled as "lower sodium" or "no salt added."  Eat fresh foods.  Eat more vegetables, fruits, and low-fat dairy products.  Choose whole grains. Look for the word "whole" as the first word in the ingredient list.  Choose fish and skinless chicken or turkey more often than red meat. Limit fish, poultry, and meat to 6 oz (170 g) each day.  Limit sweets, desserts, sugars, and sugary drinks.  Choose heart-healthy fats.  Limit cheese to 1 oz (28 g) per day.  Eat more home-cooked food and less restaurant, buffet, and fast food.  Limit fried foods.  Cook foods using methods other than frying.  Limit canned vegetables. If you do use them, rinse them well to decrease the sodium.  When eating at a restaurant, ask that your food be prepared with less salt, or no salt if possible.  WHAT FOODS CAN I EAT? Seek help from a dietitian for individual calorie needs.  Grains Whole grain or whole wheat bread. Brown rice. Whole grain or whole wheat pasta. Quinoa, bulgur, and whole grain cereals. Low-sodium cereals. Corn or whole wheat flour tortillas. Whole grain cornbread. Whole grain crackers. Low-sodium crackers.  Vegetables Fresh or frozen  vegetables (raw, steamed, roasted, or grilled). Low-sodium or reduced-sodium tomato and vegetable juices. Low-sodium or reduced-sodium tomato sauce and paste. Low-sodium or reduced-sodium canned vegetables.   Fruits All fresh, canned (in natural juice), or frozen fruits.  Meat and Other Protein Products Ground beef (85% or leaner), grass-fed beef, or beef trimmed of fat. Skinless chicken or turkey. Ground chicken or turkey. Pork trimmed of fat. All fish and seafood. Eggs. Dried beans, peas, or lentils. Unsalted nuts and seeds. Unsalted canned beans.  Dairy Low-fat dairy products, such as skim or 1% milk, 2% or reduced-fat cheeses, low-fat ricotta or cottage cheese, or plain low-fat yogurt. Low-sodium or reduced-sodium cheeses.  Fats and Oils Tub margarines without trans fats. Light or reduced-fat mayonnaise and salad dressings (reduced sodium). Avocado. Safflower, olive, or canola oils. Natural peanut or almond butter.  Other Unsalted popcorn and pretzels. The items listed above may not be a complete list of recommended foods or beverages. Contact your dietitian for more options.  WHAT FOODS ARE NOT RECOMMENDED?  Grains White bread. White pasta. White rice. Refined cornbread. Bagels and croissants. Crackers that contain trans fat.  Vegetables Creamed or fried vegetables. Vegetables in a cheese sauce. Regular canned vegetables. Regular canned tomato sauce and paste. Regular tomato and vegetable juices.  Fruits Dried fruits. Canned fruit in light or heavy syrup. Fruit juice.  Meat and Other Protein Products Fatty cuts of meat. Ribs, chicken wings, bacon, sausage, bologna, salami, chitterlings, fatback, hot dogs, bratwurst, and packaged luncheon meats. Salted nuts and seeds. Canned beans with salt.    Dairy Whole or 2% milk, cream, half-and-half, and cream cheese. Whole-fat or sweetened yogurt. Full-fat cheeses or blue cheese. Nondairy creamers and whipped toppings. Processed cheese,  cheese spreads, or cheese curds.  Condiments Onion and garlic salt, seasoned salt, table salt, and sea salt. Canned and packaged gravies. Worcestershire sauce. Tartar sauce. Barbecue sauce. Teriyaki sauce. Soy sauce, including reduced sodium. Steak sauce. Fish sauce. Oyster sauce. Cocktail sauce. Horseradish. Ketchup and mustard. Meat flavorings and tenderizers. Bouillon cubes. Hot sauce. Tabasco sauce. Marinades. Taco seasonings. Relishes.  Fats and Oils Butter, stick margarine, lard, shortening, ghee, and bacon fat. Coconut, palm kernel, or palm oils. Regular salad dressings.  Other Pickles and olives. Salted popcorn and pretzels.  The items listed above may not be a complete list of foods and beverages to avoid. Contact your dietitian for more information.  WHERE CAN I FIND MORE INFORMATION? National Heart, [employer], [employer] Institute: www.nhlbi.nih.gov/health/health-topics/topics/dash/ Document Released: 01/15/2011 Document Revised: 06/12/2013 Document Reviewed: 11/30/2012 ExitCare Patient Information 2015 [employer]. This information is not intended to replace advice given to you by your health care provider. Make sure you discuss any questions you have with your health care provider.   I think that you would greatly benefit from seeing a nutritionist.  If you are interested, please call Dr Sykes at [phone] to schedule an appointment.   

## 2019-03-09 NOTE — Progress Notes (Signed)
Subjective:  Patient ID: Janet Gardner, female    DOB: 21-Jan-1956, 64 y.o.   MRN: [id number]  Patient Care Team: Baruch Gouty, FNP as PCP - General (Family Medicine) Gala Romney Cristopher Estimable, MD as Consulting Physician (Gastroenterology)   Chief Complaint:  Medical Management of Chronic Issues (3 mo ), Hypothyroidism, and Hyperlipidemia   HPI: Janet Gardner is a 64 y.o. female presenting on 03/09/2019 for Medical Management of Chronic Issues (3 mo ), Hypothyroidism, and Hyperlipidemia   1. Vitamin D deficiency Pt is taking oral repletion therapy. Denies bone pain and tenderness, muscle weakness, fracture, and difficulty walking. Lab Results  Component Value Date   VD25OH 26.9 (L) 09/02/2018   Lab Results  Component Value Date   CALCIUM 8.7 09/02/2018      2. Acquired hypothyroidism Compliant with medications - Yes Current medications - synthroid 112 mcg Adverse side effects - No Weight - stable, has lost a few pounds  Bowel habit changes - No Heat or cold intolerance - No Mood changes - No Changes in sleep habits - No Fatigue - No Skin, hair, or nail changes - No Tremor - No Palpitations - No Edema - No Shortness of breath - No  Lab Results  Component Value Date   TSH 0.915 09/02/2018     3. Dyslipidemia Compliant with medications - Yes Current medications - atorvastatin  Side effects from medications - No Diet - generally balanced Exercise - none  Lab Results  Component Value Date   CHOL 177 09/02/2018   HDL 42 09/02/2018   LDLCALC 113 (H) 09/02/2018   TRIG 111 09/02/2018   CHOLHDL 4.2 09/02/2018     Family and personal medical history reviewed. Smoking and ETOH history reviewed.    4. Gastroesophageal reflux disease without esophagitis Compliant with medications - Yes Current medications - OTC medications as needed Adverse side effects - No Cough - No Sore throat - No Voice change - No Hemoptysis - No Dysphagia or dyspepsia - No Water brash -  No Red Flags (weight loss, hematochezia, melena, weight loss, early satiety, fevers, odynophagia, or persistent vomiting) - No   5. Fibromyalgia Doing well with mobic daily and tylenol as needed for breakthrough pain. States she has a flare about once a week or so.      Relevant past medical, surgical, family, and social history reviewed and updated as indicated.  Allergies and medications reviewed and updated. Date reviewed: Chart in Epic.   Past Medical History:  Diagnosis Date  . B12 deficiency    monthly injection  . Fibromyalgia   . GERD (gastroesophageal reflux disease)   . Hypothyroidism     Past Surgical History:  Procedure Laterality Date  . ABDOMINAL HYSTERECTOMY    . CHOLECYSTECTOMY    . colonoscopy  2007   Dr. Lavone Neri, incomplete due to poor prep. scope passed to transverse colon. ACBE normal.  . ESOPHAGOGASTRODUODENOSCOPY  07/22/2009   LKJ:ZPHXTA apperaring esophagus s/p 56 dilator/small HH/large ulcerated gastric polyp in the prepyloric antral area. Inflammatory fibroid polyp.  . ESOPHAGOGASTRODUODENOSCOPY (EGD) WITH ESOPHAGEAL DILATION N/A 11/24/2012   Procedure: ESOPHAGOGASTRODUODENOSCOPY (EGD) WITH ESOPHAGEAL DILATION;  Surgeon: Daneil Dolin, MD;  Location: [facility];  Service: Endoscopy;  Laterality: N/A;  9:30AM  . FOOT SURGERY     Right  . KIDNEY SURGERY      Social History   Socioeconomic History  . Marital status: Married    Spouse name: Not on file  . Number  of children: 1  . Years of education: Not on file  . Highest education level: Not on file  Occupational History  . Not on file  Tobacco Use  . Smoking status: Never Smoker  . Smokeless tobacco: Never Used  Substance and Sexual Activity  . Alcohol use: No  . Drug use: No  . Sexual activity: Not on file  Other Topics Concern  . Not on file  Social History Narrative  . Not on file   Social Determinants of Health   Financial Resource Strain:   . Difficulty of Paying Living  Expenses: Not on file  Food Insecurity:   . Worried About Charity fundraiser in the Last Year: Not on file  . Ran Out of Food in the Last Year: Not on file  Transportation Needs:   . Lack of Transportation (Medical): Not on file  . Lack of Transportation (Non-Medical): Not on file  Physical Activity:   . Days of Exercise per Week: Not on file  . Minutes of Exercise per Session: Not on file  Stress:   . Feeling of Stress : Not on file  Social Connections:   . Frequency of Communication with Friends and Family: Not on file  . Frequency of Social Gatherings with Friends and Family: Not on file  . Attends Religious Services: Not on file  . Active Member of Clubs or Organizations: Not on file  . Attends Archivist Meetings: Not on file  . Marital Status: Not on file  Intimate Partner Violence:   . Fear of Current or Ex-Partner: Not on file  . Emotionally Abused: Not on file  . Physically Abused: Not on file  . Sexually Abused: Not on file    Outpatient Encounter Medications as of 03/09/2019  Medication Sig  . atorvastatin (LIPITOR) 20 MG tablet Take 1 tablet (20 mg total) by mouth daily.  . calcium-vitamin D (OSCAL WITH D) 500-200 MG-UNIT tablet Take 1 tablet by mouth daily with breakfast.  . levothyroxine (SYNTHROID) 112 MCG tablet Take 1 tablet (112 mcg total) by mouth daily.  . Cholecalciferol (VITAMIN D3) 1.25 MG (50000 UT) TABS Take 1 tablet by mouth once a week.  . meloxicam (MOBIC) 7.5 MG tablet Take 1 tablet (7.5 mg total) by mouth daily. (Patient not taking: Reported on 03/09/2019)  . [DISCONTINUED] amoxicillin-clavulanate (AUGMENTIN) 875-125 MG tablet Take 1 tablet by mouth 2 (two) times daily.  . [DISCONTINUED] Cholecalciferol (VITAMIN D3) 1.25 MG (50000 UT) TABS Take by mouth once a week.  . [DISCONTINUED] predniSONE (STERAPRED UNI-PAK 21 TAB) 10 MG (21) TBPK tablet Use as directed on back of pill pack   Facility-Administered Encounter Medications as of 03/09/2019    Medication  . cyanocobalamin ((VITAMIN B-12)) injection 1,000 mcg    Allergies  Allergen Reactions  . Levaquin [Levofloxacin] Nausea And Vomiting  . Sulfa Antibiotics     Vomiting    Review of Systems  Constitutional: Negative for activity change, appetite change, chills, diaphoresis, fatigue, fever and unexpected weight change.  HENT: Negative.  Negative for sore throat, trouble swallowing and voice change.   Eyes: Negative.  Negative for photophobia and visual disturbance.  Respiratory: Negative for cough, chest tightness and shortness of breath.   Cardiovascular: Negative for chest pain, palpitations and leg swelling.  Gastrointestinal: Negative for abdominal pain, anal bleeding, blood in stool, constipation, diarrhea, nausea and vomiting.  Endocrine: Negative.  Negative for cold intolerance, heat intolerance, polydipsia, polyphagia and polyuria.  Genitourinary: Negative for decreased urine  volume, difficulty urinating, dysuria, frequency and urgency.  Musculoskeletal: Positive for arthralgias and myalgias.  Skin: Negative.   Allergic/Immunologic: Negative.   Neurological: Negative for dizziness, tremors, seizures, syncope, facial asymmetry, speech difficulty, weakness, light-headedness, numbness and headaches.  Hematological: Negative.   Psychiatric/Behavioral: Negative for confusion, hallucinations, sleep disturbance and suicidal ideas.  All other systems reviewed and are negative.       Objective:  BP (!) 142/88 (BP Location: Left Arm, Cuff Size: Normal)   Pulse 75   Temp (!) 97 F (36.1 C)   Resp 20   Ht _0  (1.753 m)   Wt 218 lb (98.9 kg)   SpO2 99%   BMI 32.19 kg/m    Wt Readings from Last 3 Encounters:  03/09/19 218 lb (98.9 kg)  12/07/18 221 lb (100.2 kg)  10/28/18 220 lb (99.8 kg)    Physical Exam Vitals and nursing note reviewed.  Constitutional:      General: She is not in acute distress.    Appearance: Normal appearance. She is well-developed  and well-groomed. She is obese. She is not ill-appearing, toxic-appearing or diaphoretic.  HENT:     Head: Normocephalic and atraumatic.     Jaw: There is normal jaw occlusion.     Right Ear: Hearing normal.     Left Ear: Hearing normal.     Nose: Nose normal.     Mouth/Throat:     Lips: Pink.     Mouth: Mucous membranes are moist.     Pharynx: Oropharynx is clear. Uvula midline.  Eyes:     General: Lids are normal.     Extraocular Movements: Extraocular movements intact.     Conjunctiva/sclera: Conjunctivae normal.     Pupils: Pupils are equal, round, and reactive to light.  Neck:     Thyroid: No thyroid mass, thyromegaly or thyroid tenderness.     Vascular: No carotid bruit or JVD.     Trachea: Trachea and phonation normal.  Cardiovascular:     Rate and Rhythm: Normal rate and regular rhythm.     Chest Wall: PMI is not displaced.     Pulses: Normal pulses.     Heart sounds: Normal heart sounds. No murmur. No friction rub. No gallop.   Pulmonary:     Effort: Pulmonary effort is normal. No respiratory distress.     Breath sounds: Normal breath sounds. No wheezing.  Abdominal:     General: Bowel sounds are normal. There is no distension or abdominal bruit.     Palpations: Abdomen is soft. There is no hepatomegaly or splenomegaly.     Tenderness: There is no abdominal tenderness. There is no right CVA tenderness or left CVA tenderness.     Hernia: No hernia is present.  Musculoskeletal:        General: Normal range of motion.     Cervical back: Normal range of motion and neck supple.     Right lower leg: No edema.     Left lower leg: No edema.  Lymphadenopathy:     Cervical: No cervical adenopathy.  Skin:    General: Skin is warm and dry.     Capillary Refill: Capillary refill takes less than 2 seconds.     Coloration: Skin is not cyanotic, jaundiced or pale.     Findings: No rash.  Neurological:     General: No focal deficit present.     Mental Status: She is alert and  oriented to person, place, and time.  Cranial Nerves: Cranial nerves are intact. No cranial nerve deficit.     Sensory: Sensation is intact. No sensory deficit.     Motor: Motor function is intact. No weakness.     Coordination: Coordination is intact. Coordination normal.     Gait: Gait is intact. Gait normal.     Deep Tendon Reflexes: Reflexes are normal and symmetric. Reflexes normal.  Psychiatric:        Attention and Perception: Attention and perception normal.        Mood and Affect: Mood and affect normal.        Speech: Speech normal.        Behavior: Behavior normal. Behavior is cooperative.        Thought Content: Thought content normal.        Cognition and Memory: Cognition and memory normal.        Judgment: Judgment normal.     Results for orders placed or performed in visit on 02/06/19  Novel Coronavirus, NAA ([facility])   Specimen: Nasopharyngeal(NP) swabs in vial transport medium   NASOPHARYNGE  TESTING  Result Value Ref Range   SARS-CoV-2, NAA Not Detected Not Detected       Pertinent labs & imaging results that were available during my care of the patient were reviewed by me and considered in my medical decision making.  Assessment & Plan:  Arriyah was seen today for medical management of chronic issues, hypothyroidism and hyperlipidemia.  Diagnoses and all orders for this visit:  Vitamin D deficiency Has been on weekly repletion therapy and doing well. Will continue.  -     Cholecalciferol (VITAMIN D3) 1.25 MG (50000 UT) TABS; Take 1 tablet by mouth once a week. -     VITAMIN D 25 Hydroxy (Vit-D Deficiency, Fractures)  Acquired hypothyroidism Thyroid disease has been well controlled. Labs are pending. Adjustments to regimen will be made if warranted. Make sure to take medications on an empty stomach with a full glass of water. Make sure to avoid vitamins or supplements for at least 4 hours before and 4 hours after taking medications. Repeat labs in 3 months  if adjustments are made and in 6 months if stable.   -     Thyroid Panel With TSH  Dyslipidemia Diet encouraged - increase intake of fresh fruits and vegetables, increase intake of lean proteins. Bake, broil, or grill foods. Avoid fried, greasy, and fatty foods. Avoid fast foods. Increase intake of fiber-rich whole grains. Exercise encouraged - at least 150 minutes per week and advance as tolerated.  Goal BMI < 25. Continue medications as prescribed. Follow up in 3-6 months as discussed.  -     CMP14+EGFR -     Lipid panel  Gastroesophageal reflux disease without esophagitis No red flags present. Diet discussed. Avoid fried, spicy, fatty, greasy, and acidic foods. Avoid caffeine, nicotine, and alcohol. Do not eat 2-3 hours before bedtime and stay upright for at least 1-2 hours after eating. Eat small frequent meals. Avoid NSAID's like motrin and aleve. Medications as prescribed. Report any new or worsening symptoms. Follow up as discussed or sooner if needed.   -     CBC with Differential/Platelet  Fibromyalgia Doing well on current medications. States she has been taking tylenol as needed for break through pain and tolerating well.   Elevated blood pressure reading in office without diagnosis of hypertension DASH diet discussed in detail. Pt will keep log of blood pressure. Pt to report any persistent high or  low readings. Follow up in 3 months for reevaluation. Will initiate therapy if warranted.     Continue all other maintenance medications.  Follow up plan: Return in about 3 months (around 06/07/2019), or if symptoms worsen or fail to improve, for BP.  Continue healthy lifestyle choices, including diet (rich in fruits, vegetables, and lean proteins, and low in salt and simple carbohydrates) and exercise (at least 30 minutes of moderate physical activity daily).  Educational handout given for DASH  The above assessment and management plan was discussed with the patient. The patient  verbalized understanding of and has agreed to the management plan. Patient is aware to call the clinic if they develop any new symptoms or if symptoms persist or worsen. Patient is aware when to return to the clinic for a follow-up visit. Patient educated on when it is appropriate to go to the emergency department.   Monia Pouch, FNP-C [facility] [phone]

## 2019-03-10 ENCOUNTER — Ambulatory Visit: Payer: Medicare HMO | Admitting: Family Medicine

## 2019-03-10 ENCOUNTER — Telehealth: Payer: Self-pay | Admitting: Family Medicine

## 2019-03-10 LAB — CBC WITH DIFFERENTIAL/PLATELET
Basophils Absolute: 0.1 10*3/uL (ref 0.0–0.2)
Basos: 1 %
EOS (ABSOLUTE): 0.2 10*3/uL (ref 0.0–0.4)
Eos: 3 %
Hematocrit: 41 % (ref 34.0–46.6)
Hemoglobin: 13.1 g/dL (ref 11.1–15.9)
Immature Grans (Abs): 0 10*3/uL (ref 0.0–0.1)
Immature Granulocytes: 0 %
Lymphocytes Absolute: 1.1 10*3/uL (ref 0.7–3.1)
Lymphs: 21 %
MCH: 26.5 pg — ABNORMAL LOW (ref 26.6–33.0)
MCHC: 32 g/dL (ref 31.5–35.7)
MCV: 83 fL (ref 79–97)
Monocytes Absolute: 0.4 10*3/uL (ref 0.1–0.9)
Monocytes: 7 %
Neutrophils Absolute: 3.6 10*3/uL (ref 1.4–7.0)
Neutrophils: 68 %
Platelets: 238 10*3/uL (ref 150–450)
RBC: 4.94 x10E6/uL (ref 3.77–5.28)
RDW: 13.5 % (ref 11.7–15.4)
WBC: 5.3 10*3/uL (ref 3.4–10.8)

## 2019-03-10 LAB — LIPID PANEL
Chol/HDL Ratio: 3.7 ratio (ref 0.0–4.4)
Cholesterol, Total: 185 mg/dL (ref 100–199)
HDL: 50 mg/dL (ref >39)
LDL Chol Calc (NIH): 122 mg/dL — ABNORMAL HIGH (ref 0–99)
Triglycerides: 72 mg/dL (ref 0–149)
VLDL Cholesterol Cal: 13 mg/dL (ref 5–40)

## 2019-03-10 LAB — CMP14+EGFR
ALT: 13 IU/L (ref 0–32)
AST: 18 IU/L (ref 0–40)
Albumin/Globulin Ratio: 1.3 (ref 1.2–2.2)
Albumin: 4 g/dL (ref 3.8–4.8)
Alkaline Phosphatase: 81 IU/L (ref 39–117)
BUN/Creatinine Ratio: 18 (ref 12–28)
BUN: 12 mg/dL (ref 8–27)
Bilirubin Total: 0.4 mg/dL (ref 0.0–1.2)
CO2: 25 mmol/L (ref 20–29)
Calcium: 8.7 mg/dL (ref 8.7–10.3)
Chloride: 105 mmol/L (ref 96–106)
Creatinine, Ser: 0.65 mg/dL (ref 0.57–1.00)
GFR calc Af Amer: 108 mL/min/{1.73_m2} (ref >59)
GFR calc non Af Amer: 93 mL/min/{1.73_m2} (ref >59)
Globulin, Total: 3 g/dL (ref 1.5–4.5)
Glucose: 95 mg/dL (ref 65–99)
Potassium: 4.2 mmol/L (ref 3.5–5.2)
Sodium: 141 mmol/L (ref 134–144)
Total Protein: 7 g/dL (ref 6.0–8.5)

## 2019-03-10 LAB — THYROID PANEL WITH TSH
Free Thyroxine Index: 2.8 (ref 1.2–4.9)
T3 Uptake Ratio: 25 % (ref 24–39)
T4, Total: 11.3 ug/dL (ref 4.5–12.0)
TSH: 3.36 u[IU]/mL (ref 0.450–4.500)

## 2019-03-10 LAB — VITAMIN D 25 HYDROXY (VIT D DEFICIENCY, FRACTURES): Vit D, 25-Hydroxy: 21.9 ng/mL — ABNORMAL LOW (ref 30.0–100.0)

## 2019-03-10 NOTE — Telephone Encounter (Signed)
[id number]

## 2019-03-12 ENCOUNTER — Other Ambulatory Visit: Payer: Self-pay | Admitting: Family Medicine

## 2019-03-12 DIAGNOSIS — M5431 Sciatica, right side: Secondary | ICD-10-CM

## 2019-03-16 ENCOUNTER — Encounter: Payer: Self-pay | Admitting: Family Medicine

## 2019-03-20 ENCOUNTER — Other Ambulatory Visit (HOSPITAL_COMMUNITY): Payer: Self-pay | Admitting: Family Medicine

## 2019-03-20 DIAGNOSIS — Z1231 Encounter for screening mammogram for malignant neoplasm of breast: Secondary | ICD-10-CM

## 2019-03-21 ENCOUNTER — Encounter: Payer: Self-pay | Admitting: Family

## 2019-03-21 ENCOUNTER — Other Ambulatory Visit: Payer: Self-pay

## 2019-03-21 ENCOUNTER — Ambulatory Visit (INDEPENDENT_AMBULATORY_CARE_PROVIDER_SITE_OTHER): Payer: Medicare HMO | Admitting: Family

## 2019-03-21 VITALS — BP 135/70 | HR 82 | Temp 96.6°F | Ht 69.0 in | Wt 217.4 lb

## 2019-03-21 DIAGNOSIS — N390 Urinary tract infection, site not specified: Secondary | ICD-10-CM | POA: Diagnosis not present

## 2019-03-21 DIAGNOSIS — N3001 Acute cystitis with hematuria: Secondary | ICD-10-CM | POA: Diagnosis not present

## 2019-03-21 LAB — URINALYSIS, COMPLETE
Bilirubin, UA: NEGATIVE
Glucose, UA: NEGATIVE
Ketones, UA: NEGATIVE
Nitrite, UA: NEGATIVE
Specific Gravity, UA: 1.025 (ref 1.005–1.030)
Urobilinogen, Ur: 0.2 mg/dL (ref 0.2–1.0)
pH, UA: 5.5 (ref 5.0–7.5)

## 2019-03-21 LAB — MICROSCOPIC EXAMINATION
Renal Epithel, UA: NONE SEEN /hpf
WBC, UA: >30 /hpf — AB (ref 0–5)

## 2019-03-21 MED ORDER — CEPHALEXIN 500 MG PO CAPS: 500.0000 mg | ORAL_CAPSULE | Freq: Two times a day (BID) | ORAL | 0 refills | Status: DC

## 2019-03-21 NOTE — Patient Instructions (Signed)
Urinary Tract Infection, Adult A urinary tract infection (UTI) is an infection of any part of the urinary tract. The urinary tract includes:  The kidneys.  The ureters.  The bladder.  The urethra. These organs make, store, and get rid of pee (urine) in the body. What are the causes? This is caused by germs (bacteria) in your genital area. These germs grow and cause swelling (inflammation) of your urinary tract. What increases the risk? You are more likely to develop this condition if:  You have a small, thin tube (catheter) to drain pee.  You cannot control when you pee or poop (incontinence).  You are female, and: ? You use these methods to prevent pregnancy:  A medicine that kills sperm (spermicide).  A device that blocks sperm (diaphragm). ? You have low levels of a female hormone (estrogen). ? You are pregnant.  You have genes that add to your risk.  You are sexually active.  You take antibiotic medicines.  You have trouble peeing because of: ? A prostate that is bigger than normal, if you are female. ? A blockage in the part of your body that drains pee from the bladder (urethra). ? A kidney stone. ? A nerve condition that affects your bladder (neurogenic bladder). ? Not getting enough to drink. ? Not peeing often enough.  You have other conditions, such as: ? Diabetes. ? A weak disease-fighting system (immune system). ? Sickle cell disease. ? Gout. ? Injury of the spine. What are the signs or symptoms? Symptoms of this condition include:  Needing to pee right away (urgently).  Peeing often.  Peeing small amounts often.  Pain or burning when peeing.  Blood in the pee.  Pee that smells bad or not like normal.  Trouble peeing.  Pee that is cloudy.  Fluid coming from the vagina, if you are female.  Pain in the belly or lower back. Other symptoms include:  Throwing up (vomiting).  No urge to eat.  Feeling mixed up (confused).  Being tired  and grouchy (irritable).  A fever.  Watery poop (diarrhea). How is this treated? This condition may be treated with:  Antibiotic medicine.  Other medicines.  Drinking enough water. Follow these instructions at home:  Medicines  Take over-the-counter and prescription medicines only as told by your doctor.  If you were prescribed an antibiotic medicine, take it as told by your doctor. Do not stop taking it even if you start to feel better. General instructions  Make sure you: ? Pee until your bladder is empty. ? Do not hold pee for a long time. ? Empty your bladder after sex. ? Wipe from front to back after pooping if you are a female. Use each tissue one time when you wipe.  Drink enough fluid to keep your pee pale yellow.  Keep all follow-up visits as told by your doctor. This is important. Contact a doctor if:  You do not get better after 1-2 days.  Your symptoms go away and then come back. Get help right away if:  You have very bad back pain.  You have very bad pain in your lower belly.  You have a fever.  You are sick to your stomach (nauseous).  You are throwing up. Summary  A urinary tract infection (UTI) is an infection of any part of the urinary tract.  This condition is caused by germs in your genital area.  There are many risk factors for a UTI. These include having a small, thin   tube to drain pee and not being able to control when you pee or poop.  Treatment includes antibiotic medicines for germs.  Drink enough fluid to keep your pee pale yellow. This information is not intended to replace advice given to you by your health care provider. Make sure you discuss any questions you have with your health care provider. Document Revised: 01/13/2018 Document Reviewed: 08/05/2017 Elsevier Patient Education  2020 [employer]  

## 2019-03-21 NOTE — Progress Notes (Signed)
   Subjective:    Patient ID: Janet Gardner, female    DOB: Jun 08, 1955, 64 y.o.   MRN: [id number]  Chief Complaint  Patient presents with  . Urinary Tract Infection    PCP aware BP is high and is adressing it at next visit if still high will add medication    Urinary Tract Infection  This is a new problem. The current episode started in the past 7 days. The problem occurs every urination. The problem has been gradually worsening. The quality of the pain is described as aching. The pain is at a severity of 2/10 (after uriating). The pain is moderate. Associated symptoms include frequency, hesitancy and urgency. Pertinent negatives include no chills, discharge, flank pain, hematuria or nausea. Associated symptoms comments: Foul odor . She has tried increased fluids for the symptoms. The treatment provided mild relief.      Review of Systems  Constitutional: Negative for chills.  Gastrointestinal: Negative for nausea.  Genitourinary: Positive for frequency, hesitancy and urgency. Negative for flank pain and hematuria.  All other systems reviewed and are negative.      Objective:   Physical Exam Vitals reviewed.  Constitutional:      General: She is not in acute distress.    Appearance: She is well-developed.  HENT:     Head: Normocephalic and atraumatic.  Eyes:     Pupils: Pupils are equal, round, and reactive to light.  Neck:     Thyroid: No thyromegaly.  Cardiovascular:     Rate and Rhythm: Normal rate and regular rhythm.     Heart sounds: Normal heart sounds. No murmur.  Pulmonary:     Effort: Pulmonary effort is normal. No respiratory distress.     Breath sounds: Normal breath sounds. No wheezing.  Abdominal:     General: Bowel sounds are normal. There is no distension.     Palpations: Abdomen is soft.     Tenderness: There is no abdominal tenderness.  Musculoskeletal:        General: No tenderness. Normal range of motion.     Cervical back: Normal range of motion and  neck supple.  Skin:    General: Skin is warm and dry.  Neurological:     Mental Status: She is alert and oriented to person, place, and time.     Cranial Nerves: No cranial nerve deficit.     Deep Tendon Reflexes: Reflexes are normal and symmetric.  Psychiatric:        Behavior: Behavior normal.        Thought Content: Thought content normal.        Judgment: Judgment normal.       BP 135/70   Pulse 82   Temp (!) 96.6 F (35.9 C) (Temporal)   Ht 5\' 9"  (1.753 m)   Wt 217 lb 6.4 oz (98.6 kg)   SpO2 99%   BMI 32.10 kg/m      Assessment & Plan:  Janet Gardner comes in today with chief complaint of Urinary Tract Infection (PCP aware BP is high and is adressing it at next visit if still high will add medication)   Diagnosis and orders addressed:  1. Acute cystitis with hematuria Force fluids AZO over the counter X2 days RTO prn Culture pending - Urinalysis, Complete - Urine Culture - cephALEXin (KEFLEX) 500 MG capsule; Take 1 capsule (500 mg total) by mouth 2 (two) times daily.  Dispense: 14 capsule; Refill: 0   Rosebud Poles, FNP

## 2019-03-23 ENCOUNTER — Telehealth: Payer: Self-pay | Admitting: *Deleted

## 2019-03-23 NOTE — Telephone Encounter (Signed)
Fax from [facility] RF request Nystop 100000 pow  Apply powder top to affected area TID Last OV 03/21/19. Last RF not on current med list. Next OV 04/22/19

## 2019-03-23 NOTE — Telephone Encounter (Signed)
Will discuss at next OV

## 2019-03-23 NOTE — Telephone Encounter (Signed)
Aware. 

## 2019-03-24 ENCOUNTER — Ambulatory Visit (HOSPITAL_COMMUNITY): Payer: Medicare HMO

## 2019-03-26 LAB — URINE CULTURE

## 2019-03-29 ENCOUNTER — Other Ambulatory Visit: Payer: Self-pay

## 2019-03-29 ENCOUNTER — Ambulatory Visit (HOSPITAL_COMMUNITY)
Admission: RE | Admit: 2019-03-29 | Discharge: 2019-03-29 | Disposition: A | Discharge status: Home / Self Care | Payer: Medicare HMO | Source: Ambulatory Visit | Attending: Family Medicine | Admitting: Family Medicine

## 2019-03-29 DIAGNOSIS — Z1231 Encounter for screening mammogram for malignant neoplasm of breast: Secondary | ICD-10-CM

## 2019-05-10 ENCOUNTER — Ambulatory Visit (INDEPENDENT_AMBULATORY_CARE_PROVIDER_SITE_OTHER): Payer: Medicare HMO | Admitting: Family Medicine

## 2019-05-10 ENCOUNTER — Ambulatory Visit: Payer: Medicare HMO

## 2019-05-10 ENCOUNTER — Other Ambulatory Visit: Payer: Self-pay

## 2019-05-10 ENCOUNTER — Encounter: Payer: Self-pay | Admitting: Family Medicine

## 2019-05-10 VITALS — BP 125/79 | HR 64 | Temp 96.9°F | Ht 69.0 in | Wt 214.8 lb

## 2019-05-10 DIAGNOSIS — I493 Ventricular premature depolarization: Secondary | ICD-10-CM | POA: Diagnosis not present

## 2019-05-10 DIAGNOSIS — E785 Hyperlipidemia, unspecified: Secondary | ICD-10-CM

## 2019-05-10 DIAGNOSIS — E559 Vitamin D deficiency, unspecified: Secondary | ICD-10-CM

## 2019-05-10 DIAGNOSIS — R030 Elevated blood-pressure reading, without diagnosis of hypertension: Secondary | ICD-10-CM

## 2019-05-10 DIAGNOSIS — E039 Hypothyroidism, unspecified: Secondary | ICD-10-CM

## 2019-05-10 NOTE — Addendum Note (Signed)
Addended by: Sonny Masters on: 05/10/2019 01:17 PM   Modules accepted: Level of Service

## 2019-05-10 NOTE — Progress Notes (Addendum)
Subjective:  Patient ID: Janet Gardner, female    DOB: [birth year], 64 y.o.   MRN: [id number]  Patient Care Team: Baruch Gouty, FNP as PCP - General (Family Medicine) Gala Romney Cristopher Estimable, MD as Consulting Physician (Gastroenterology)   Chief Complaint:  Medical Management of Chronic Issues, Hypertension, Hypothyroidism, and Hyperlipidemia   HPI: Janet Gardner is a 64 y.o. female presenting on 05/10/2019 for Medical Management of Chronic Issues, Hypertension, Hypothyroidism, and Hyperlipidemia  1. Elevated blood pressure reading without the diagnosis of hypertension No complaints of syncope, palpitations, or headaches. Does not check blood pressure on a regular basis at home. No previous diagnosis of hypertension.   2. Dyslipidemia Patient admits to occasional adherence to heart health diet. Compliant with medications. No associated side effects.   3. Acquired hypothyroidism Denies hypo- or hyperthyroid symptoms. Compliant with medications, takes daily on empty stomach.   4. Vitamin D deficiency Pt is taking oral repletion therapy. Denies bone pain and tenderness, muscle weakness, fracture, and difficulty walking.     Relevant past medical, surgical, family, and social history reviewed and updated as indicated.  Allergies and medications reviewed and updated. Date reviewed: Chart in Epic.   Past Medical History:  Diagnosis Date   B12 deficiency    monthly injection   Fibromyalgia    GERD (gastroesophageal reflux disease)    Hypothyroidism     Past Surgical History:  Procedure Laterality Date   ABDOMINAL HYSTERECTOMY     CHOLECYSTECTOMY     colonoscopy  2007   Dr. Lavone Neri, incomplete due to poor prep. scope passed to transverse colon. ACBE normal.   ESOPHAGOGASTRODUODENOSCOPY  07/22/2009   XFG:HWEXHB apperaring esophagus s/p 56 dilator/small HH/large ulcerated gastric polyp in the prepyloric antral area. Inflammatory fibroid polyp.   ESOPHAGOGASTRODUODENOSCOPY (EGD)  WITH ESOPHAGEAL DILATION N/A 11/24/2012   Procedure: ESOPHAGOGASTRODUODENOSCOPY (EGD) WITH ESOPHAGEAL DILATION;  Surgeon: Daneil Dolin, MD;  Location: [facility];  Service: Endoscopy;  Laterality: N/A;  9:30AM   FOOT SURGERY     Right   KIDNEY SURGERY      Social History   Socioeconomic History   Marital status: Married    Spouse name: Not on file   Number of children: 1   Years of education: Not on file   Highest education level: Not on file  Occupational History   Not on file  Tobacco Use   Smoking status: Never Smoker   Smokeless tobacco: Never Used  Substance and Sexual Activity   Alcohol use: No   Drug use: No   Sexual activity: Not on file  Other Topics Concern   Not on file  Social History Narrative   Not on file   Social Determinants of Health   Financial Resource Strain:    Difficulty of Paying Living Expenses:   Food Insecurity:    Worried About Charity fundraiser in the Last Year:    Arboriculturist in the Last Year:   Transportation Needs:    Film/video editor (Medical):    Lack of Transportation (Non-Medical):   Physical Activity:    Days of Exercise per Week:    Minutes of Exercise per Session:   Stress:    Feeling of Stress :   Social Connections:    Frequency of Communication with Friends and Family:    Frequency of Social Gatherings with Friends and Family:    Attends Religious Services:    Active Member of Clubs or Organizations:  Attends Archivist Meetings:    Marital Status:   Intimate Partner Violence:    Fear of Current or Ex-Partner:    Emotionally Abused:    Physically Abused:    Sexually Abused:     Outpatient Encounter Medications as of 05/10/2019  Medication Sig   atorvastatin (LIPITOR) 20 MG tablet Take 1 tablet (20 mg total) by mouth daily.   Cholecalciferol (VITAMIN D3) 1.25 MG (50000 UT) TABS Take 1 tablet by mouth once a week.   levothyroxine (SYNTHROID) 112 MCG  tablet Take 1 tablet (112 mcg total) by mouth daily.   [DISCONTINUED] calcium-vitamin D (OSCAL WITH D) 500-200 MG-UNIT tablet Take 1 tablet by mouth daily with breakfast.   [DISCONTINUED] cephALEXin (KEFLEX) 500 MG capsule Take 1 capsule (500 mg total) by mouth 2 (two) times daily.   [DISCONTINUED] meloxicam (MOBIC) 7.5 MG tablet Take 1 tablet by mouth once daily   Facility-Administered Encounter Medications as of 05/10/2019  Medication   cyanocobalamin ((VITAMIN B-12)) injection 1,000 mcg    Allergies  Allergen Reactions   Levaquin [Levofloxacin] Nausea And Vomiting   Sulfa Antibiotics     Vomiting    Review of Systems  Constitutional: Negative for activity change, appetite change, chills, diaphoresis, fatigue, fever and unexpected weight change.  HENT: Negative.   Eyes: Negative.  Negative for photophobia and visual disturbance.  Respiratory: Negative for cough, chest tightness and shortness of breath.   Cardiovascular: Negative for chest pain, palpitations and leg swelling.  Gastrointestinal: Negative for abdominal pain, blood in stool, constipation, diarrhea, nausea and vomiting.  Endocrine: Negative.  Negative for polydipsia, polyphagia and polyuria.  Genitourinary: Negative for decreased urine volume, difficulty urinating, dysuria, frequency and urgency.  Musculoskeletal: Negative for arthralgias and myalgias.  Skin: Negative.   Allergic/Immunologic: Negative.   Neurological: Negative for dizziness, tremors, seizures, syncope, facial asymmetry, speech difficulty, weakness, light-headedness, numbness and headaches.  Hematological: Negative.   Psychiatric/Behavioral: Negative for confusion, hallucinations, sleep disturbance and suicidal ideas.  All other systems reviewed and are negative.       Objective:  BP 125/79    Pulse 64    Temp (!) 96.9 F (36.1 C)    Ht '5\' 9"'  (1.753 m)    Wt 214 lb 12.8 oz (97.4 kg)    SpO2 100%    BMI 31.72 kg/m    Wt Readings from Last 3  Encounters:  05/10/19 214 lb 12.8 oz (97.4 kg)  03/21/19 217 lb 6.4 oz (98.6 kg)  03/09/19 218 lb (98.9 kg)    Physical Exam Vitals and nursing note reviewed.  Constitutional:      General: She is not in acute distress.    Appearance: Normal appearance. She is well-developed and well-groomed. She is obese. She is not ill-appearing, toxic-appearing or diaphoretic.  HENT:     Head: Normocephalic and atraumatic.     Jaw: There is normal jaw occlusion.     Right Ear: Hearing normal.     Left Ear: Hearing normal.     Nose: Nose normal.     Mouth/Throat:     Lips: Pink.     Mouth: Mucous membranes are moist.     Pharynx: Oropharynx is clear. Uvula midline.  Eyes:     General: Lids are normal.     Extraocular Movements: Extraocular movements intact.     Conjunctiva/sclera: Conjunctivae normal.     Pupils: Pupils are equal, round, and reactive to light.  Neck:     Thyroid: No thyroid mass, thyromegaly  or thyroid tenderness.     Vascular: No carotid bruit or JVD.     Trachea: Trachea and phonation normal.  Cardiovascular:     Rate and Rhythm: Normal rate and regular rhythm. Occasional extrasystoles are present.    Chest Wall: PMI is not displaced.     Pulses: Normal pulses.     Heart sounds: Normal heart sounds. No murmur. No friction rub. No gallop.   Pulmonary:     Effort: Pulmonary effort is normal. No respiratory distress.     Breath sounds: Normal breath sounds. No wheezing.  Abdominal:     General: Bowel sounds are normal. There is no distension or abdominal bruit.     Palpations: Abdomen is soft. There is no hepatomegaly or splenomegaly.     Tenderness: There is no abdominal tenderness. There is no right CVA tenderness or left CVA tenderness.     Hernia: No hernia is present.  Musculoskeletal:        General: Normal range of motion.     Cervical back: Normal range of motion and neck supple.     Right lower leg: No edema.     Left lower leg: No edema.  Lymphadenopathy:      Cervical: No cervical adenopathy.  Skin:    General: Skin is warm and dry.     Capillary Refill: Capillary refill takes less than 2 seconds.     Coloration: Skin is not cyanotic, jaundiced or pale.     Findings: No rash.  Neurological:     General: No focal deficit present.     Mental Status: She is alert and oriented to person, place, and time.     Cranial Nerves: Cranial nerves are intact. No cranial nerve deficit.     Sensory: Sensation is intact. No sensory deficit.     Motor: Motor function is intact. No weakness.     Coordination: Coordination is intact. Coordination normal.     Gait: Gait is intact. Gait normal.     Deep Tendon Reflexes: Reflexes are normal and symmetric. Reflexes normal.  Psychiatric:        Attention and Perception: Attention and perception normal.        Mood and Affect: Mood and affect normal.        Speech: Speech normal.        Behavior: Behavior normal. Behavior is cooperative.        Thought Content: Thought content normal.        Cognition and Memory: Cognition and memory normal.        Judgment: Judgment normal.     Results for orders placed or performed in visit on 03/21/19  Urine Culture   Specimen: Urine   UR  Result Value Ref Range   Urine Culture, Routine Final report (A)    Organism ID, Bacteria Comment (A)    Antimicrobial Susceptibility Comment   Microscopic Examination   URINE  Result Value Ref Range   WBC, UA >30 (A) 0 - 5 /hpf   RBC 11-30 (A) 0 - 2 /hpf   Epithelial Cells (non renal) 0-10 0 - 10 /hpf   Renal Epithel, UA None seen None seen /hpf   Mucus, UA Present Not Estab.   Bacteria, UA Moderate (A) None seen/Few  Urinalysis, Complete  Result Value Ref Range   Specific Gravity, UA 1.025 1.005 - 1.030   pH, UA 5.5 5.0 - 7.5   Color, UA Yellow Yellow   Appearance Ur Clear Clear  Leukocytes,UA 2+ (A) Negative   Protein,UA 1+ (A) Negative/Trace   Glucose, UA Negative Negative   Ketones, UA Negative Negative   RBC, UA  2+ (A) Negative   Bilirubin, UA Negative Negative   Urobilinogen, Ur 0.2 0.2 - 1.0 mg/dL   Nitrite, UA Negative Negative   Microscopic Examination See below:      EKG: No acute ST-T changes. Occasional unifocal PVC's. Monia Pouch, FNP-C  Pertinent labs & imaging results that were available during my care of the patient were reviewed by me and considered in my medical decision making.  Assessment & Plan:  Angelys was seen today for medical management of chronic issues, hypertension, hypothyroidism and hyperlipidemia.  Diagnoses and all orders for this visit:  Elevated blood pressure reading without the diagnosis of hypertension Manual reading 122/78 at end of visit.  DASH diet and exercise discussed in detail. Will keep record of BP readings over next 6 weeks. Will initiate therapy at that time if BP remains elevated at home. Labs pending.  -     CMP14+EGFR -     CBC with Differential/Platelet -     Lipid panel -     Thyroid Panel With TSH -     Microalbumin / creatinine urine ratio -     EKG 12-Lead -     DG Chest 2 View; Future  Dyslipidemia Diet encouraged - increase intake of fresh fruits and vegetables, increase intake of lean proteins. Bake, broil, or grill foods. Avoid fried, greasy, and fatty foods. Avoid fast foods. Increase intake of fiber-rich whole grains. Exercise encouraged - at least 150 minutes per week and advance as tolerated.  Goal BMI < 25. Continue medications as prescribed. Follow up in 3-6 months as discussed.   -     CMP14+EGFR -     Lipid panel -     EKG 12-Lead  Acquired hypothyroidism Thyroid disease has been well controlled. Labs are pending. Adjustments to regimen will be made if warranted. Make sure to take medications on an empty stomach with a full glass of water. Make sure to avoid vitamins or supplements for at least 4 hours before and 4 hours after taking medications. Repeat labs in 3 months if adjustments are made and in 6 months if stable.   -      Thyroid Panel With TSH  Vitamin D deficiency Labs pending. Continue repletion therapy. If indicated, will change repletion dosage. Eat foods rich in Vit D including milk, orange juice, yogurt with vitamin D added, salmon or mackerel, canned tuna fish, cereals with vitamin D added, and cod liver oil. Get out in the sun but make sure to wear at least SPF 30 sunscreen.        -       Vitamin D level  PVC's on EKG, will refer to cardiology as this is a new finding.    Continue all other maintenance medications.  Follow up plan: Return in 6 weeks (on 06/21/2019), or if symptoms worsen or fail to improve, for HTN.    Continue healthy lifestyle choices, including diet (rich in fruits, vegetables, and lean proteins, and low in salt and simple carbohydrates) and exercise (at least 30 minutes of moderate physical activity daily).  Educational handout given for DASH diet. Blood pressure log given to patient.  The above assessment and management plan was discussed with the patient. The patient verbalized understanding of and has agreed to the management plan. Patient is aware to call the clinic if  they develop any new symptoms or if symptoms persist or worsen. Patient is aware when to return to the clinic for a follow-up visit. Patient educated on when it is appropriate to go to the emergency department.   Robynn Pane, RN, FNP student   I personally was present during the history, physical exam, and medical decision-making activities of this service and have verified that the service and findings are accurately documented in the nurse practitioner student's note.  Monia Pouch, FNP-C [facility] [address] [place], [place] [address] [phone]

## 2019-05-10 NOTE — Patient Instructions (Signed)
DASH Eating Plan DASH stands for "Dietary Approaches to Stop Hypertension." The DASH eating plan is a healthy eating plan that has been shown to reduce high blood pressure (hypertension). Additional health benefits may include reducing the risk of type 2 diabetes mellitus, heart disease, and stroke. The DASH eating plan may also help with weight loss.  WHAT DO I NEED TO KNOW ABOUT THE DASH EATING PLAN? For the DASH eating plan, you will follow these general guidelines:  Choose foods with a percent daily value for sodium of less than 5% (as listed on the food label).  Use salt-free seasonings or herbs instead of table salt or sea salt.  Check with your health care provider or pharmacist before using salt substitutes.  Eat lower-sodium products, often labeled as "lower sodium" or "no salt added."  Eat fresh foods.  Eat more vegetables, fruits, and low-fat dairy products.  Choose whole grains. Look for the word "whole" as the first word in the ingredient list.  Choose fish and skinless chicken or turkey more often than red meat. Limit fish, poultry, and meat to 6 oz (170 g) each day.  Limit sweets, desserts, sugars, and sugary drinks.  Choose heart-healthy fats.  Limit cheese to 1 oz (28 g) per day.  Eat more home-cooked food and less restaurant, buffet, and fast food.  Limit fried foods.  Cook foods using methods other than frying.  Limit canned vegetables. If you do use them, rinse them well to decrease the sodium.  When eating at a restaurant, ask that your food be prepared with less salt, or no salt if possible.  WHAT FOODS CAN I EAT? Seek help from a dietitian for individual calorie needs.  Grains Whole grain or whole wheat bread. Brown rice. Whole grain or whole wheat pasta. Quinoa, bulgur, and whole grain cereals. Low-sodium cereals. Corn or whole wheat flour tortillas. Whole grain cornbread. Whole grain crackers. Low-sodium crackers.  Vegetables Fresh or frozen  vegetables (raw, steamed, roasted, or grilled). Low-sodium or reduced-sodium tomato and vegetable juices. Low-sodium or reduced-sodium tomato sauce and paste. Low-sodium or reduced-sodium canned vegetables.   Fruits All fresh, canned (in natural juice), or frozen fruits.  Meat and Other Protein Products Ground beef (85% or leaner), grass-fed beef, or beef trimmed of fat. Skinless chicken or turkey. Ground chicken or turkey. Pork trimmed of fat. All fish and seafood. Eggs. Dried beans, peas, or lentils. Unsalted nuts and seeds. Unsalted canned beans.  Dairy Low-fat dairy products, such as skim or 1% milk, 2% or reduced-fat cheeses, low-fat ricotta or cottage cheese, or plain low-fat yogurt. Low-sodium or reduced-sodium cheeses.  Fats and Oils Tub margarines without trans fats. Light or reduced-fat mayonnaise and salad dressings (reduced sodium). Avocado. Safflower, olive, or canola oils. Natural peanut or almond butter.  Other Unsalted popcorn and pretzels. The items listed above may not be a complete list of recommended foods or beverages. Contact your dietitian for more options.  WHAT FOODS ARE NOT RECOMMENDED?  Grains White bread. White pasta. White rice. Refined cornbread. Bagels and croissants. Crackers that contain trans fat.  Vegetables Creamed or fried vegetables. Vegetables in a cheese sauce. Regular canned vegetables. Regular canned tomato sauce and paste. Regular tomato and vegetable juices.  Fruits Dried fruits. Canned fruit in light or heavy syrup. Fruit juice.  Meat and Other Protein Products Fatty cuts of meat. Ribs, chicken wings, bacon, sausage, bologna, salami, chitterlings, fatback, hot dogs, bratwurst, and packaged luncheon meats. Salted nuts and seeds. Canned beans with salt.    Dairy Whole or 2% milk, cream, half-and-half, and cream cheese. Whole-fat or sweetened yogurt. Full-fat cheeses or blue cheese. Nondairy creamers and whipped toppings. Processed cheese,  cheese spreads, or cheese curds.  Condiments Onion and garlic salt, seasoned salt, table salt, and sea salt. Canned and packaged gravies. Worcestershire sauce. Tartar sauce. Barbecue sauce. Teriyaki sauce. Soy sauce, including reduced sodium. Steak sauce. Fish sauce. Oyster sauce. Cocktail sauce. Horseradish. Ketchup and mustard. Meat flavorings and tenderizers. Bouillon cubes. Hot sauce. Tabasco sauce. Marinades. Taco seasonings. Relishes.  Fats and Oils Butter, stick margarine, lard, shortening, ghee, and bacon fat. Coconut, palm kernel, or palm oils. Regular salad dressings.  Other Pickles and olives. Salted popcorn and pretzels.  The items listed above may not be a complete list of foods and beverages to avoid. Contact your dietitian for more information.  WHERE CAN I FIND MORE INFORMATION? National Heart, [employer], [employer] Institute: www.nhlbi.nih.gov/health/health-topics/topics/dash/ Document Released: 01/15/2011 Document Revised: 06/12/2013 Document Reviewed: 11/30/2012 ExitCare Patient Information 2015 [employer]. This information is not intended to replace advice given to you by your health care provider. Make sure you discuss any questions you have with your health care provider.   I think that you would greatly benefit from seeing a nutritionist.  If you are interested, please call Dr Sykes at [phone] to schedule an appointment.   

## 2019-05-11 ENCOUNTER — Telehealth: Payer: Self-pay | Admitting: Family Medicine

## 2019-05-11 DIAGNOSIS — E039 Hypothyroidism, unspecified: Secondary | ICD-10-CM

## 2019-05-11 LAB — CMP14+EGFR
ALT: 13 IU/L (ref 0–32)
AST: 15 IU/L (ref 0–40)
Albumin/Globulin Ratio: 1.3 (ref 1.2–2.2)
Albumin: 3.9 g/dL (ref 3.8–4.8)
Alkaline Phosphatase: 81 IU/L (ref 39–117)
BUN/Creatinine Ratio: 20 (ref 12–28)
BUN: 14 mg/dL (ref 8–27)
Bilirubin Total: 0.3 mg/dL (ref 0.0–1.2)
CO2: 23 mmol/L (ref 20–29)
Calcium: 9 mg/dL (ref 8.7–10.3)
Chloride: 105 mmol/L (ref 96–106)
Creatinine, Ser: 0.7 mg/dL (ref 0.57–1.00)
GFR calc Af Amer: 104 mL/min/{1.73_m2} (ref >59)
GFR calc non Af Amer: 91 mL/min/{1.73_m2} (ref >59)
Globulin, Total: 3 g/dL (ref 1.5–4.5)
Glucose: 83 mg/dL (ref 65–99)
Potassium: 4.8 mmol/L (ref 3.5–5.2)
Sodium: 140 mmol/L (ref 134–144)
Total Protein: 6.9 g/dL (ref 6.0–8.5)

## 2019-05-11 LAB — THYROID PANEL WITH TSH
Free Thyroxine Index: 2.9 (ref 1.2–4.9)
T3 Uptake Ratio: 27 % (ref 24–39)
T4, Total: 10.8 ug/dL (ref 4.5–12.0)
TSH: 6.06 u[IU]/mL — ABNORMAL HIGH (ref 0.450–4.500)

## 2019-05-11 LAB — CBC WITH DIFFERENTIAL/PLATELET
Basophils Absolute: 0 10*3/uL (ref 0.0–0.2)
Basos: 1 %
EOS (ABSOLUTE): 0.3 10*3/uL (ref 0.0–0.4)
Eos: 7 %
Hematocrit: 41.5 % (ref 34.0–46.6)
Hemoglobin: 13.2 g/dL (ref 11.1–15.9)
Immature Grans (Abs): 0 10*3/uL (ref 0.0–0.1)
Immature Granulocytes: 0 %
Lymphocytes Absolute: 1.3 10*3/uL (ref 0.7–3.1)
Lymphs: 30 %
MCH: 26.7 pg (ref 26.6–33.0)
MCHC: 31.8 g/dL (ref 31.5–35.7)
MCV: 84 fL (ref 79–97)
Monocytes Absolute: 0.3 10*3/uL (ref 0.1–0.9)
Monocytes: 8 %
Neutrophils Absolute: 2.5 10*3/uL (ref 1.4–7.0)
Neutrophils: 54 %
Platelets: 235 10*3/uL (ref 150–450)
RBC: 4.94 x10E6/uL (ref 3.77–5.28)
RDW: 14.6 % (ref 11.7–15.4)
WBC: 4.5 10*3/uL (ref 3.4–10.8)

## 2019-05-11 LAB — LIPID PANEL
Chol/HDL Ratio: 4 ratio (ref 0.0–4.4)
Cholesterol, Total: 172 mg/dL (ref 100–199)
HDL: 43 mg/dL (ref >39)
LDL Chol Calc (NIH): 114 mg/dL — ABNORMAL HIGH (ref 0–99)
Triglycerides: 79 mg/dL (ref 0–149)
VLDL Cholesterol Cal: 15 mg/dL (ref 5–40)

## 2019-05-11 LAB — MICROALBUMIN / CREATININE URINE RATIO
Creatinine, Urine: 104.4 mg/dL
Microalb/Creat Ratio: 17 mg/g creat (ref 0–29)
Microalbumin, Urine: 17.9 ug/mL

## 2019-05-11 LAB — VITAMIN D 25 HYDROXY (VIT D DEFICIENCY, FRACTURES): Vit D, 25-Hydroxy: 39.4 ng/mL (ref 30.0–100.0)

## 2019-05-11 MED ORDER — LEVOTHYROXINE SODIUM 125 MCG PO TABS: 125.0000 ug | ORAL_TABLET | Freq: Every day | ORAL | 5 refills | Status: DC

## 2019-05-11 NOTE — Addendum Note (Signed)
Addended by: Sonny Masters on: 05/11/2019 02:08 PM   Modules accepted: Orders

## 2019-05-11 NOTE — Telephone Encounter (Signed)
Refer to lab results.  

## 2019-05-11 NOTE — Telephone Encounter (Signed)
RX cancelled at [facility] and re sent to mail order per patient request- patient aware

## 2019-05-11 NOTE — Progress Notes (Signed)
Urine microalbumin normal. Glucose, kidney function, liver function, and vitamin D are all normal. CBC normal. Slightly elevated at 114.  Limit fried, greasy, fatty, and fast foods.  Make sure to try to exercise daily.  As prescribed. Thyroid function is slightly hypoactive.  Will increase levothyroxine to 125 mcg daily.  Recheck thyroid function in 3 months.

## 2019-05-16 ENCOUNTER — Ambulatory Visit (INDEPENDENT_AMBULATORY_CARE_PROVIDER_SITE_OTHER): Payer: Medicare HMO | Admitting: Internal Medicine

## 2019-05-18 ENCOUNTER — Telehealth: Payer: Self-pay | Admitting: Family Medicine

## 2019-05-23 DIAGNOSIS — L03032 Cellulitis of left toe: Secondary | ICD-10-CM | POA: Diagnosis not present

## 2019-05-23 DIAGNOSIS — M79676 Pain in unspecified toe(s): Secondary | ICD-10-CM | POA: Diagnosis not present

## 2019-05-23 DIAGNOSIS — B351 Tinea unguium: Secondary | ICD-10-CM | POA: Diagnosis not present

## 2019-06-02 ENCOUNTER — Telehealth: Payer: Self-pay

## 2019-06-02 ENCOUNTER — Ambulatory Visit (INDEPENDENT_AMBULATORY_CARE_PROVIDER_SITE_OTHER): Payer: Medicare HMO

## 2019-06-02 VITALS — BP 133/78

## 2019-06-02 DIAGNOSIS — Z0000 Encounter for general adult medical examination without abnormal findings: Secondary | ICD-10-CM

## 2019-06-02 NOTE — Telephone Encounter (Signed)
While speaking to patient for her AWV today she c/o urinary incontinence. This ocurrs when she laughs, sneezes, coughs or if she has held urine to long. She denies dysuria, hematuria.  States that she does not want to drive to [place]. Suggested that [facility] has providers that practice in [place] on certain days. Patient agrees to driving there.

## 2019-06-02 NOTE — Telephone Encounter (Signed)
Left message to please call our office. 

## 2019-06-02 NOTE — Patient Instructions (Signed)
  MEDICARE ANNUAL WELLNESS VISIT Health Maintenance Summary and Written Plan of Care  [name] ,  Thank you for allowing me to perform your Medicare Annual Wellness Visit and for your ongoing commitment to your health.   Health Maintenance & Immunization History Health Maintenance  Topic Date Due  . COLONOSCOPY  03/20/2020 (Originally 10/21/2015)  . PNA vac Low Risk Adult (2 of 2 - PPSV23) 09/02/2019  . INFLUENZA VACCINE  09/10/2019  . MAMMOGRAM  03/28/2021  . TETANUS/TDAP  07/14/2025  . DEXA SCAN  Completed  . COVID-19 Vaccine  Completed  . Hepatitis C Screening  Completed   Immunization History  Administered Date(s) Administered  . Fluad Quad(high Dose 65+) 10/13/2018  . Influenza-Unspecified 11/20/2015, 11/04/2016, 11/09/2017  . Moderna SARS-COVID-2 Vaccination 04/06/2019, 05/05/2019  . Pneumococcal Conjugate-13 09/02/2018  . Tdap 07/15/2015    These are the patient goals that we discussed: Goals Addressed            This Visit's Progress   . Exercise 150 min/wk Moderate Activity      . Patient Stated       Referral to Urology for urinary incontinence        This is a list of Health Maintenance Items that are overdue or due now: There are no preventive care reminders to display for this patient.   Orders/Referrals Placed Today: No orders of the defined types were placed in this encounter.  (Contact our referral department at [phone] if you have not spoken with someone about your referral appointment within the next 5 days)    Follow-up Plan  With Deliah Boston, FNP on 06/22/2019  A message has been sent to [facility] to set up a referral to Urology. You will be contacted either by our office or the Urology office to schedule an appointment.

## 2019-06-02 NOTE — Progress Notes (Signed)
MEDICARE ANNUAL WELLNESS VISIT  06/02/2019  Telephone Visit Disclaimer This Medicare AWV was conducted by telephone due to national recommendations for restrictions regarding the COVID-19 Pandemic (e.g. social distancing).  I verified, using two identifiers, that I am speaking with Janet Gardner or their authorized healthcare agent. I discussed the limitations, risks, security, and privacy concerns of performing an evaluation and management service by telephone and the potential availability of an in-person appointment in the future. The patient expressed understanding and agreed to proceed.   Subjective:  Janet Gardner is a 64 y.o. female patient of Gwenlyn Fudge, FNP who had a Medicare Annual Wellness Visit today via telephone. Sharunda is Retired and lives with their spouse and 54 year old granddaughter lives with them. she reports that she is socially active and does interact with friends/family regularly. she is minimally physically active.  Patient Care Team: Gwenlyn Fudge, FNP as PCP - General (Family Medicine) Jena Gauss Gerrit Friends, MD as Consulting Physician (Gastroenterology)  Advanced Directives 06/02/2019 11/24/2012  Does Patient Have a Medical Advance Directive? No Patient does not have advance directive;Patient would not like information  Would patient like information on creating a medical advance directive? No - Patient declined [employer] Utilization Over the Past 12 Months: # of hospitalizations or ER visits: 0 # of surgeries: 1  Review of Systems    Patient reports that her overall health is worse compared to last year.  Negative except Patient had a recent toenail removal due to an infection. The nail was removed by Dr. Ulice Brilliant. A knot was seen at that time on patient's right ankle and was discussed that it may need a cortisone injection. Patient comlains of pain in BLE L>R. Dr. Ulice Brilliant was not concerned about a blood clot per patient. Patient would like this evaluated by  Britney at her appointment that is scheduled for 06/22/2019.   Patient also complains of urinary incontinence. She has had this for years. Patient does not want to drive to [facility] for treatment. Informed patient that providers from [facility] also practice in [place] on certain days. She would like to have a referral. Patient states that she leaks urine when she coughs, sneezes, laughs. Also when she has held urine too long. She denies, dysuria, hematuria today. A telephone note will be sent to Britney Joyce,FNP to see about referring patient to Urology.  Patient Reported Readings (BP-138/78  Pain Assessment Pain : Faces Faces Pain Scale: Hurts little more Pain Type: Chronic pain Pain Location: Leg Pain Orientation: Lateral Pain Radiating Towards: feet Pain Descriptors / Indicators: Shooting, Discomfort Pain Frequency: Intermittent  Faces Pain Scale: Hurts little more  Current Medications & Allergies (verified) Allergies as of 06/02/2019      Reactions   Levaquin [levofloxacin] Nausea And Vomiting   Sulfa Antibiotics    Vomiting      Medication List       Accurate as of June 02, 2019  9:39 AM. If you have any questions, ask your nurse or doctor.        atorvastatin 20 MG tablet Commonly known as: LIPITOR Take 1 tablet (20 mg total) by mouth daily.   levothyroxine 125 MCG tablet Commonly known as: Synthroid Take 1 tablet (125 mcg total) by mouth daily before breakfast.   mupirocin ointment 2 % Commonly known as: BACTROBAN Apply 1 application topically as needed.   Vitamin D3 1.25 MG (50000 UT) Tabs Take 1 tablet by mouth once a  week.       History (reviewed): Past Medical History:  Diagnosis Date  . B12 deficiency    monthly injection  . Fibromyalgia   . GERD (gastroesophageal reflux disease)   . Hypothyroidism    Past Surgical History:  Procedure Laterality Date  . ABDOMINAL HYSTERECTOMY    . CHOLECYSTECTOMY    . colonoscopy  2007   Dr.  Lavone Neri, incomplete due to poor prep. scope passed to transverse colon. ACBE normal.  . ESOPHAGOGASTRODUODENOSCOPY  07/22/2009   OXB:[id number] apperaring esophagus s/p 56 dilator/small HH/large ulcerated gastric polyp in the prepyloric antral area. Inflammatory fibroid polyp.  . ESOPHAGOGASTRODUODENOSCOPY (EGD) WITH ESOPHAGEAL DILATION N/A 11/24/2012   Procedure: ESOPHAGOGASTRODUODENOSCOPY (EGD) WITH ESOPHAGEAL DILATION;  Surgeon: Daneil Dolin, MD;  [facility];  Service: Endoscopy;  Laterality: N/A;  9:30AM  . FOOT SURGERY     Right  . KIDNEY SURGERY     Family History  Problem Relation Age of Onset  . Breast cancer Mother   . Heart disease Father   . Colon cancer Neg Hx    Social History   Socioeconomic History  . Marital status: Married    Spouse name: Not on file  . Number of children: 1  . Years of education: Not on file  . Highest education level: Not on file  Occupational History  . Not on file  Tobacco Use  . Smoking status: Never Smoker  . Smokeless tobacco: Never Used  Substance and Sexual Activity  . Alcohol use: No  . Drug use: No  . Sexual activity: Not on file  Other Topics Concern  . Not on file  Social History Narrative  . Not on file   Social Determinants of Health   Financial Resource Strain:   . Difficulty of Paying Living Expenses:   Food Insecurity:   . Worried About Charity fundraiser in the Last Year:   . Arboriculturist in the Last Year:   Transportation Needs:   . Film/video editor (Medical):   Marland Kitchen Lack of Transportation (Non-Medical):   Physical Activity:   . Days of Exercise per Week:   . Minutes of Exercise per Session:   Stress:   . Feeling of Stress :   Social Connections:   . Frequency of Communication with Friends and Family:   . Frequency of Social Gatherings with Friends and Family:   . Attends Religious Services:   . Active Member of Clubs or Organizations:   . Attends Archivist Meetings:   Marland Kitchen Marital  Status:     Activities of Daily Living In your present state of health, do you have any difficulty performing the following activities: 06/02/2019  Hearing? N  Vision? N  Difficulty concentrating or making decisions? N  Walking or climbing stairs? N  Dressing or bathing? N  Doing errands, shopping? N  Preparing Food and eating ? N  Using the Toilet? N  In the past six months, have you accidently leaked urine? Y  Do you have problems with loss of bowel control? N  Managing your Medications? N  Managing your Finances? N  Housekeeping or managing your Housekeeping? N  Some recent data might be hidden    Patient Education/ Literacy How often do you need to have someone help you when you read instructions, pamphlets, or other written materials from your doctor or pharmacy?: 1 - Never What is the last grade level you completed in school?: 12th grade  Exercise  Current Exercise Habits: The patient does not participate in regular exercise at present, Exercise limited by: orthopedic condition(s)  Diet Patient reports consuming 2 meals a day and 2 snack(s) a day Patient reports that her primary diet is: Regular Patient reports that she does have regular access to food.   Depression Screen PHQ 2/9 Scores 06/02/2019 05/10/2019 03/21/2019 03/09/2019 12/07/2018 10/28/2018 09/21/2018  PHQ - 2 Score 0 0 0 0 0 0 0  PHQ- 9 Score - - - - - - -     Fall Risk Fall Risk  06/02/2019 05/10/2019 03/21/2019 03/09/2019 12/07/2018  Falls in the past year? 0 0 0 0 0     Objective:  Janet Gardner seemed alert and oriented and she participated appropriately during our telephone visit.  Blood Pressure Weight BMI  BP Readings from Last 3 Encounters:  06/02/19 133/78  05/10/19 125/79  03/21/19 135/70   Wt Readings from Last 3 Encounters:  05/10/19 214 lb 12.8 oz (97.4 kg)  03/21/19 217 lb 6.4 oz (98.6 kg)  03/09/19 218 lb (98.9 kg)   BMI Readings from Last 1 Encounters:  05/10/19 31.72 kg/m    *Unable to  obtain current vital signs, weight, and BMI due to telephone visit type  Hearing/Vision  . Nadea did not seem to have difficulty with hearing/understanding during the telephone conversation . Reports that she has not had a formal eye exam by an eye care professional within the past year . Reports that she has not had a formal hearing evaluation within the past year *Unable to fully assess hearing and vision during telephone visit type  Cognitive Function: 6CIT Screen 06/02/2019  What Year? 0 points  What month? 0 points  What time? 0 points  Count back from 20 0 points  Months in reverse 0 points  Repeat phrase 0 points  Total Score 0   (Normal:0-7, Significant for Dysfunction: >8)  Normal Cognitive Function Screening: Yes   Immunization & Health Maintenance Record Immunization History  Administered Date(s) Administered  . Fluad Quad(high Dose 65+) 10/13/2018  . Influenza-Unspecified 11/20/2015, 11/04/2016, 11/09/2017  . Moderna SARS-COVID-2 Vaccination 04/06/2019, 05/05/2019  . Pneumococcal Conjugate-13 09/02/2018  . Tdap 07/15/2015    Health Maintenance  Topic Date Due  . COLONOSCOPY  03/20/2020 (Originally 10/21/2015)  . PNA vac Low Risk Adult (2 of 2 - PPSV23) 09/02/2019  . INFLUENZA VACCINE  09/10/2019  . MAMMOGRAM  03/28/2021  . TETANUS/TDAP  07/14/2025  . DEXA SCAN  Completed  . COVID-19 Vaccine  Completed  . Hepatitis C Screening  Completed       Assessment  This is a routine wellness examination for Janet Gardner.  Health Maintenance: Due or Overdue There are no preventive care reminders to display for this patient.  Janet Gardner does not need a referral for Community Assistance: Care Management:   no Social Work:    no Prescription Assistance:  no Nutrition/Diabetes Education:  no   Plan:  Personalized Goals Goals Addressed            This Visit's Progress   . Exercise 150 min/wk Moderate Activity      . Patient Stated       Referral to  Urology for urinary incontinence      Personalized Health Maintenance & Screening Recommendations   Lung Cancer Screening Recommended: no (Low Dose CT Chest recommended if Age 72-80 years, 30 pack-year currently smoking OR have quit w/in past 15 years) Hepatitis C Screening recommended: no HIV  Screening recommended: no  Advanced Directives: Written information was not prepared per patient's request.  Referrals & Orders No orders of the defined types were placed in this encounter.   Follow-up Plan . Follow-up with Gwenlyn Fudge, FNP as planned . Schedule 06/22/2019    I have personally reviewed and noted the following in the patient's chart:   . Medical and social history . Use of alcohol, tobacco or illicit drugs  . Current medications and supplements . Functional ability and status . Nutritional status . Physical activity . Advanced directives . List of other physicians . Hospitalizations, surgeries, and ER visits in previous 12 months . Vitals . Screenings to include cognitive, depression, and falls . Referrals and appointments  In addition, I have reviewed and discussed with Janet Gardner certain preventive protocols, quality metrics, and best practice recommendations. A written personalized care plan for preventive services as well as general preventive health recommendations is available and can be mailed to the patient at her request.      Dorene Sorrow, LPN  [id number]

## 2019-06-02 NOTE — Telephone Encounter (Signed)
I will discuss urinary incontinence with patient when she comes in for her visit next month. There are options prior to a referral to urology. I have never met her either.

## 2019-06-06 NOTE — Telephone Encounter (Signed)
Appointment on 06-22-19. No return call. Note closed.

## 2019-06-19 NOTE — Progress Notes (Signed)
Assessment & Plan:  1. Elevated blood pressure reading without diagnosis of hypertension - Controlled without medication.   2. Acquired hypothyroidism - TSH  3. Urinary frequency - Urine Culture - Urinalysis, Complete: Urine dipstick shows positive for RBC's and positive for leukocytes.  Micro exam: 6-10 WBC's per HPF, 3-10 RBC's per HPF and few bacteria.  4. Urge incontinence of urine - Patient is going to try Myrbetriq.  Education provided on urinary incontinence. - mirabegron ER (MYRBETRIQ) 25 MG TB24 tablet; Take 1 tablet (25 mg total) by mouth daily.  Dispense: 30 tablet; Refill: 2  5. Fibromyalgia - meloxicam (MOBIC) 7.5 MG tablet; Take 1 tablet (7.5 mg total) by mouth daily.  Dispense: 90 tablet; Refill: 1   Return in about 4 weeks (around 07/20/2019) for Urine.  Janet Boston, MSN, APRN, FNP-C [facility]  Subjective:    Patient ID: Janet Gardner, female    DOB: [birth year], 64 y.o.   MRN: [id number]  Patient Care Team: Gwenlyn Fudge, FNP as PCP - General (Family Medicine) Jena Gauss Gerrit Friends, MD as Consulting Physician (Gastroenterology)   Chief Complaint:  Chief Complaint  Patient presents with  . Establish Care    rakes pt  . Hypertension    6 week follow up    HPI: Janet Gardner is a 64 y.o. female presenting on 06/22/2019 for Establish Care (rakes pt) and Hypertension (6 week follow up)  Patient has an elevated BP when she was last here ~6 weeks ago. She was advised to keep a BP log at home. Average systolic at home 824/235T; average diastolic 70-80s.   Patient's levothyroxine dosage was increased to 125 mcg QD 6 weeks ago.  She is due for a recheck of her TSH.  Patient reported during her AWV that she was having some urinary incontinence that occurs when she laughs, sneezes, coughs, or has to hold urine too long.   New complaints: Patient complains of urinary frequency. She has had symptoms for 2 weeks. Patient denies dysuria, back  pain, fever and stomach ache. Patient does have a history of recurrent UTI.  Patient does not have a history of pyelonephritis.    Social history:  Relevant past medical, surgical, family and social history reviewed and updated as indicated. Interim medical history since our last visit reviewed.  Allergies and medications reviewed and updated.  DATA REVIEWED: CHART IN EPIC  ROS: Negative unless specifically indicated above in HPI.    Current Outpatient Medications:  .  atorvastatin (LIPITOR) 20 MG tablet, Take 1 tablet (20 mg total) by mouth daily., Disp: 90 tablet, Rfl: 3 .  Cholecalciferol (VITAMIN D3) 1.25 MG (50000 UT) TABS, Take 1 tablet by mouth once a week., Disp: 15 tablet, Rfl: 1 .  levothyroxine (SYNTHROID) 125 MCG tablet, Take 1 tablet (125 mcg total) by mouth daily before breakfast., Disp: 30 tablet, Rfl: 5 .  meloxicam (MOBIC) 7.5 MG tablet, Take 1 tablet (7.5 mg total) by mouth daily., Disp: 90 tablet, Rfl: 1 .  mupirocin ointment (BACTROBAN) 2 %, Apply 1 application topically as needed., Disp: , Rfl:  .  mirabegron ER (MYRBETRIQ) 25 MG TB24 tablet, Take 1 tablet (25 mg total) by mouth daily., Disp: 30 tablet, Rfl: 2   Allergies  Allergen Reactions  . Levaquin [Levofloxacin] Nausea And Vomiting  . Sulfa Antibiotics     Vomiting   Past Medical History:  Diagnosis Date  . B12 deficiency    monthly injection  . Fibromyalgia   .  GERD (gastroesophageal reflux disease)   . Hypothyroidism     Past Surgical History:  Procedure Laterality Date  . ABDOMINAL HYSTERECTOMY    . CHOLECYSTECTOMY    . colonoscopy  2007   Dr. Laurell Josephs, incomplete due to poor prep. scope passed to transverse colon. ACBE normal.  . ESOPHAGOGASTRODUODENOSCOPY  07/22/2009   OFB:PZWCHE apperaring esophagus s/p 56 dilator/small HH/large ulcerated gastric polyp in the prepyloric antral area. Inflammatory fibroid polyp.  . ESOPHAGOGASTRODUODENOSCOPY (EGD) WITH ESOPHAGEAL DILATION N/A 11/24/2012    Procedure: ESOPHAGOGASTRODUODENOSCOPY (EGD) WITH ESOPHAGEAL DILATION;  Surgeon: Corbin Ade, MD;  Location: [facility];  Service: Endoscopy;  Laterality: N/A;  9:30AM  . FOOT SURGERY     Right  . KIDNEY SURGERY      Social History   Socioeconomic History  . Marital status: Married    Spouse name: Not on file  . Number of children: 1  . Years of education: Not on file  . Highest education level: Not on file  Occupational History  . Not on file  Tobacco Use  . Smoking status: Never Smoker  . Smokeless tobacco: Never Used  Substance and Sexual Activity  . Alcohol use: No  . Drug use: No  . Sexual activity: Not on file  Other Topics Concern  . Not on file  Social History Narrative  . Not on file   Social Determinants of Health   Financial Resource Strain:   . Difficulty of Paying Living Expenses:   Food Insecurity:   . Worried About Programme researcher, broadcasting/film/video in the Last Year:   . Barista in the Last Year:   Transportation Needs:   . Freight forwarder (Medical):   Marland Kitchen Lack of Transportation (Non-Medical):   Physical Activity:   . Days of Exercise per Week:   . Minutes of Exercise per Session:   Stress:   . Feeling of Stress :   Social Connections:   . Frequency of Communication with Friends and Family:   . Frequency of Social Gatherings with Friends and Family:   . Attends Religious Services:   . Active Member of Clubs or Organizations:   . Attends Banker Meetings:   Marland Kitchen Marital Status:   Intimate Partner Violence:   . Fear of Current or Ex-Partner:   . Emotionally Abused:   Marland Kitchen Physically Abused:   . Sexually Abused:         Objective:    BP 132/82 Comment: manual  Pulse 67   Temp (!) 95 F (35 C) (Temporal)   Ht 5\' 9"  (1.753 m)   Wt 214 lb 9.6 oz (97.3 kg)   SpO2 100%   BMI 31.69 kg/m   Wt Readings from Last 3 Encounters:  06/22/19 214 lb 9.6 oz (97.3 kg)  05/10/19 214 lb 12.8 oz (97.4 kg)  03/21/19 217 lb 6.4 oz (98.6 kg)     Physical Exam Vitals reviewed.  Constitutional:      General: She is not in acute distress.    Appearance: Normal appearance. She is obese. She is not ill-appearing, toxic-appearing or diaphoretic.  HENT:     Head: Normocephalic and atraumatic.  Eyes:     General: No scleral icterus.       Right eye: No discharge.        Left eye: No discharge.     Conjunctiva/sclera: Conjunctivae normal.  Cardiovascular:     Rate and Rhythm: Normal rate and regular rhythm.  Heart sounds: Normal heart sounds. No murmur. No friction rub. No gallop.   Pulmonary:     Effort: Pulmonary effort is normal. No respiratory distress.     Breath sounds: Normal breath sounds. No stridor. No wheezing, rhonchi or rales.  Musculoskeletal:        General: Normal range of motion.     Cervical back: Normal range of motion.  Skin:    General: Skin is warm and dry.     Capillary Refill: Capillary refill takes less than 2 seconds.  Neurological:     General: No focal deficit present.     Mental Status: She is alert and oriented to person, place, and time. Mental status is at baseline.  Psychiatric:        Mood and Affect: Mood normal.        Behavior: Behavior normal.        Thought Content: Thought content normal.        Judgment: Judgment normal.     Lab Results  Component Value Date   TSH 1.060 06/22/2019   Lab Results  Component Value Date   WBC 4.5 05/10/2019   HGB 13.2 05/10/2019   HCT 41.5 05/10/2019   MCV 84 05/10/2019   PLT 235 05/10/2019   Lab Results  Component Value Date   NA 140 05/10/2019   K 4.8 05/10/2019   CO2 23 05/10/2019   GLUCOSE 83 05/10/2019   BUN 14 05/10/2019   CREATININE 0.70 05/10/2019   BILITOT 0.3 05/10/2019   ALKPHOS 81 05/10/2019   AST 15 05/10/2019   ALT 13 05/10/2019   PROT 6.9 05/10/2019   ALBUMIN 3.9 05/10/2019   CALCIUM 9.0 05/10/2019   Lab Results  Component Value Date   CHOL 172 05/10/2019   Lab Results  Component Value Date   HDL 43  05/10/2019   Lab Results  Component Value Date   LDLCALC 114 (H) 05/10/2019   Lab Results  Component Value Date   TRIG 79 05/10/2019   Lab Results  Component Value Date   CHOLHDL 4.0 05/10/2019   No results found for: HGBA1C

## 2019-06-21 ENCOUNTER — Ambulatory Visit: Payer: Medicare HMO | Admitting: Cardiology

## 2019-06-22 ENCOUNTER — Encounter: Payer: Self-pay | Admitting: Family Medicine

## 2019-06-22 ENCOUNTER — Other Ambulatory Visit: Payer: Self-pay

## 2019-06-22 ENCOUNTER — Ambulatory Visit (INDEPENDENT_AMBULATORY_CARE_PROVIDER_SITE_OTHER): Payer: Medicare HMO | Admitting: Family Medicine

## 2019-06-22 VITALS — BP 132/82 | HR 67 | Temp 95.0°F | Ht 69.0 in | Wt 214.6 lb

## 2019-06-22 DIAGNOSIS — R350 Frequency of micturition: Secondary | ICD-10-CM | POA: Diagnosis not present

## 2019-06-22 DIAGNOSIS — M797 Fibromyalgia: Secondary | ICD-10-CM

## 2019-06-22 DIAGNOSIS — R030 Elevated blood-pressure reading, without diagnosis of hypertension: Secondary | ICD-10-CM | POA: Diagnosis not present

## 2019-06-22 DIAGNOSIS — E039 Hypothyroidism, unspecified: Secondary | ICD-10-CM | POA: Diagnosis not present

## 2019-06-22 DIAGNOSIS — N3941 Urge incontinence: Secondary | ICD-10-CM

## 2019-06-22 LAB — MICROSCOPIC EXAMINATION

## 2019-06-22 LAB — URINALYSIS, COMPLETE
Bilirubin, UA: NEGATIVE
Glucose, UA: NEGATIVE
Ketones, UA: NEGATIVE
Nitrite, UA: NEGATIVE
Protein,UA: NEGATIVE
Specific Gravity, UA: 1.025 (ref 1.005–1.030)
Urobilinogen, Ur: 1 mg/dL (ref 0.2–1.0)
pH, UA: 7 (ref 5.0–7.5)

## 2019-06-22 MED ORDER — MELOXICAM 7.5 MG PO TABS: 7.5000 mg | ORAL_TABLET | Freq: Every day | ORAL | 1 refills | Status: DC

## 2019-06-22 MED ORDER — MIRABEGRON ER 25 MG PO TB24: 25.0000 mg | ORAL_TABLET | Freq: Every day | ORAL | 2 refills | Status: DC

## 2019-06-22 NOTE — Patient Instructions (Signed)

## 2019-06-23 LAB — TSH: TSH: 1.06 u[IU]/mL (ref 0.450–4.500)

## 2019-06-25 ENCOUNTER — Encounter: Payer: Self-pay | Admitting: Family Medicine

## 2019-06-26 ENCOUNTER — Telehealth: Payer: Self-pay | Admitting: Family Medicine

## 2019-06-26 LAB — URINE CULTURE

## 2019-06-26 NOTE — Telephone Encounter (Signed)
Aware of result. 

## 2019-06-27 ENCOUNTER — Telehealth: Payer: Self-pay | Admitting: Family Medicine

## 2019-06-27 MED ORDER — CEFDINIR 300 MG PO CAPS: 300.0000 mg | ORAL_CAPSULE | Freq: Two times a day (BID) | ORAL | 0 refills | Status: DC

## 2019-06-27 NOTE — Telephone Encounter (Signed)
Patient's culture grew bacteria, gave antibiotic Arville Care, MD [facility] 06/27/2019, 9:09 PM

## 2019-06-27 NOTE — Telephone Encounter (Signed)
-----   Message from Almeta Monas, LPN sent at 9/60/4540 10:56 AM EDT ----- Aware of urine culture lab results .   Pcp not working today.  Back up provider , please advise. She has burning when she is at the end of urinating.  What to do?

## 2019-06-28 NOTE — Telephone Encounter (Signed)
Patient aware.

## 2019-07-12 ENCOUNTER — Other Ambulatory Visit: Payer: Self-pay | Admitting: Family Medicine

## 2019-07-26 ENCOUNTER — Encounter: Payer: Self-pay | Admitting: Family Medicine

## 2019-07-26 ENCOUNTER — Other Ambulatory Visit: Payer: Self-pay

## 2019-07-26 ENCOUNTER — Ambulatory Visit (INDEPENDENT_AMBULATORY_CARE_PROVIDER_SITE_OTHER): Payer: Medicare HMO | Admitting: Family Medicine

## 2019-07-26 VITALS — BP 140/71 | HR 95 | Temp 95.8°F | Ht 69.0 in | Wt 210.2 lb

## 2019-07-26 DIAGNOSIS — N3001 Acute cystitis with hematuria: Secondary | ICD-10-CM | POA: Diagnosis not present

## 2019-07-26 DIAGNOSIS — N390 Urinary tract infection, site not specified: Secondary | ICD-10-CM | POA: Diagnosis not present

## 2019-07-26 DIAGNOSIS — R399 Unspecified symptoms and signs involving the genitourinary system: Secondary | ICD-10-CM | POA: Diagnosis not present

## 2019-07-26 LAB — URINALYSIS, COMPLETE
Bilirubin, UA: NEGATIVE
Glucose, UA: NEGATIVE
Ketones, UA: NEGATIVE
Nitrite, UA: NEGATIVE
Specific Gravity, UA: 1.025 (ref 1.005–1.030)
Urobilinogen, Ur: 0.2 mg/dL (ref 0.2–1.0)
pH, UA: 5 (ref 5.0–7.5)

## 2019-07-26 LAB — MICROSCOPIC EXAMINATION: WBC, UA: >30 /hpf — AB (ref 0–5)

## 2019-07-26 MED ORDER — CIPROFLOXACIN HCL 500 MG PO TABS: 500.0000 mg | ORAL_TABLET | Freq: Two times a day (BID) | ORAL | 0 refills | Status: AC

## 2019-07-26 NOTE — Patient Instructions (Signed)
Urinary Tract Infection, Adult A urinary tract infection (UTI) is an infection of any part of the urinary tract. The urinary tract includes:  The kidneys.  The ureters.  The bladder.  The urethra. These organs make, store, and get rid of pee (urine) in the body. What are the causes? This is caused by germs (bacteria) in your genital area. These germs grow and cause swelling (inflammation) of your urinary tract. What increases the risk? You are more likely to develop this condition if:  You have a small, thin tube (catheter) to drain pee.  You cannot control when you pee or poop (incontinence).  You are female, and: ? You use these methods to prevent pregnancy:  A medicine that kills sperm (spermicide).  A device that blocks sperm (diaphragm). ? You have low levels of a female hormone (estrogen). ? You are pregnant.  You have genes that add to your risk.  You are sexually active.  You take antibiotic medicines.  You have trouble peeing because of: ? A prostate that is bigger than normal, if you are female. ? A blockage in the part of your body that drains pee from the bladder (urethra). ? A kidney stone. ? A nerve condition that affects your bladder (neurogenic bladder). ? Not getting enough to drink. ? Not peeing often enough.  You have other conditions, such as: ? Diabetes. ? A weak disease-fighting system (immune system). ? Sickle cell disease. ? Gout. ? Injury of the spine. What are the signs or symptoms? Symptoms of this condition include:  Needing to pee right away (urgently).  Peeing often.  Peeing small amounts often.  Pain or burning when peeing.  Blood in the pee.  Pee that smells bad or not like normal.  Trouble peeing.  Pee that is cloudy.  Fluid coming from the vagina, if you are female.  Pain in the belly or lower back. Other symptoms include:  Throwing up (vomiting).  No urge to eat.  Feeling mixed up (confused).  Being tired  and grouchy (irritable).  A fever.  Watery poop (diarrhea). How is this treated? This condition may be treated with:  Antibiotic medicine.  Other medicines.  Drinking enough water. Follow these instructions at home:  Medicines  Take over-the-counter and prescription medicines only as told by your doctor.  If you were prescribed an antibiotic medicine, take it as told by your doctor. Do not stop taking it even if you start to feel better. General instructions  Make sure you: ? Pee until your bladder is empty. ? Do not hold pee for a long time. ? Empty your bladder after sex. ? Wipe from front to back after pooping if you are a female. Use each tissue one time when you wipe.  Drink enough fluid to keep your pee pale yellow.  Keep all follow-up visits as told by your doctor. This is important. Contact a doctor if:  You do not get better after 1-2 days.  Your symptoms go away and then come back. Get help right away if:  You have very bad back pain.  You have very bad pain in your lower belly.  You have a fever.  You are sick to your stomach (nauseous).  You are throwing up. Summary  A urinary tract infection (UTI) is an infection of any part of the urinary tract.  This condition is caused by germs in your genital area.  There are many risk factors for a UTI. These include having a small, thin   tube to drain pee and not being able to control when you pee or poop.  Treatment includes antibiotic medicines for germs.  Drink enough fluid to keep your pee pale yellow. This information is not intended to replace advice given to you by your health care provider. Make sure you discuss any questions you have with your health care provider. Document Revised: 01/13/2018 Document Reviewed: 08/05/2017 Elsevier Patient Education  2020 [employer]  

## 2019-07-26 NOTE — Progress Notes (Signed)
Assessment & Plan:  1. Acute cystitis with hematuria - Education provided on UTIs. Encouraged adequate hydration.  - ciprofloxacin (CIPRO) 500 MG tablet; Take 1 tablet (500 mg total) by mouth 2 (two) times daily for 10 days.  Dispense: 20 tablet; Refill: 0  2. Frequent UTI - Ambulatory referral to Urology  3. UTI symptoms - Urine Culture - Urinalysis, Complete: Urine dipstick shows positive for RBC's, positive for protein and positive for leukocytes.  Micro exam: >30 WBC's per HPF, 0-2 RBC's per HPF and many bacteria.   Return in about 3 months (around 10/26/2019) for follow-up of chronic medication conditions w. labs.  Deliah Boston, MSN, APRN, FNP-C [facility]  Subjective:    Patient ID: Janet Gardner, Janet Gardner    DOB: 1955-06-08, 64 y.o.   MRN: [id number]  Patient Care Team: Gwenlyn Fudge, FNP as PCP - General (Family Medicine) Jena Gauss Gerrit Friends, MD as Consulting Physician (Gastroenterology)   Chief Complaint:  Chief Complaint  Patient presents with  . odor in urine    x 1 week    HPI: Janet Gardner is a 64 y.o. Janet Gardner presenting on 07/26/2019 for odor in urine (x 1 week)  Urinary Tract Infection: Patient complains of foul smelling urine and foamy urine She has had symptoms for 1 week. Patient denies back pain and fever. Patient does have a history of recurrent UTI - this is the 3rd since the beginning of this year.  Patient does not have a history of pyelonephritis.    Social history:  Relevant past medical, surgical, family and social history reviewed and updated as indicated. Interim medical history since our last visit reviewed.  Allergies and medications reviewed and updated.  DATA REVIEWED: CHART IN EPIC  ROS: Negative unless specifically indicated above in HPI.    Current Outpatient Medications:  .  atorvastatin (LIPITOR) 20 MG tablet, Take 1 tablet (20 mg total) by mouth daily., Disp: 90 tablet, Rfl: 3 .  Cholecalciferol (VITAMIN D3)  1.25 MG (50000 UT) TABS, Take 1 tablet by mouth once a week., Disp: 15 tablet, Rfl: 1 .  levothyroxine (SYNTHROID) 125 MCG tablet, Take 1 tablet (125 mcg total) by mouth daily before breakfast., Disp: 30 tablet, Rfl: 5 .  meloxicam (MOBIC) 7.5 MG tablet, Take 1 tablet (7.5 mg total) by mouth daily., Disp: 90 tablet, Rfl: 1 .  mupirocin ointment (BACTROBAN) 2 %, Apply 1 application topically as needed., Disp: , Rfl:    Allergies  Allergen Reactions  . Levaquin [Levofloxacin] Nausea And Vomiting  . Sulfa Antibiotics     Vomiting   Past Medical History:  Diagnosis Date  . B12 deficiency    monthly injection  . Fibromyalgia   . GERD (gastroesophageal reflux disease)   . Hypothyroidism     Past Surgical History:  Procedure Laterality Date  . ABDOMINAL HYSTERECTOMY    . CHOLECYSTECTOMY    . colonoscopy  2007   Dr. Laurell Josephs, incomplete due to poor prep. scope passed to transverse colon. ACBE normal.  . ESOPHAGOGASTRODUODENOSCOPY  07/22/2009   WLS:LHTDSK apperaring esophagus s/p 56 dilator/small HH/large ulcerated gastric polyp in the prepyloric antral area. Inflammatory fibroid polyp.  . ESOPHAGOGASTRODUODENOSCOPY (EGD) WITH ESOPHAGEAL DILATION N/A 11/24/2012   Procedure: ESOPHAGOGASTRODUODENOSCOPY (EGD) WITH ESOPHAGEAL DILATION;  Surgeon: Corbin Ade, MD;  [facility];  Service: Endoscopy;  Laterality: N/A;  9:30AM  . FOOT SURGERY     Right  . KIDNEY SURGERY      Social History  Socioeconomic History  . Marital status: Married    Spouse name: Not on file  . Number of children: 1  . Years of education: Not on file  . Highest education level: Not on file  Occupational History  . Not on file  Tobacco Use  . Smoking status: Never Smoker  . Smokeless tobacco: Never Used  Vaping Use  . Vaping Use: Never used  Substance and Sexual Activity  . Alcohol use: No  . Drug use: No  . Sexual activity: Not on file  Other Topics Concern  . Not on file  Social History  Narrative  . Not on file   Social Determinants of Health   Financial Resource Strain:   . Difficulty of Paying Living Expenses:   Food Insecurity:   . Worried About Programme researcher, broadcasting/film/video in the Last Year:   . Barista in the Last Year:   Transportation Needs:   . Freight forwarder (Medical):   Marland Kitchen Lack of Transportation (Non-Medical):   Physical Activity:   . Days of Exercise per Week:   . Minutes of Exercise per Session:   Stress:   . Feeling of Stress :   Social Connections:   . Frequency of Communication with Friends and Family:   . Frequency of Social Gatherings with Friends and Family:   . Attends Religious Services:   . Active Member of Clubs or Organizations:   . Attends Banker Meetings:   Marland Kitchen Marital Status:   Intimate Partner Violence:   . Fear of Current or Ex-Partner:   . Emotionally Abused:   Marland Kitchen Physically Abused:   . Sexually Abused:         Objective:    BP 140/71   Pulse 95   Temp (!) 95.8 F (35.4 C) (Temporal)   Ht 5\' 9"  (1.753 m)   Wt 210 lb 3.2 oz (95.3 kg)   SpO2 96%   BMI 31.04 kg/m   Wt Readings from Last 3 Encounters:  07/26/19 210 lb 3.2 oz (95.3 kg)  06/22/19 214 lb 9.6 oz (97.3 kg)  05/10/19 214 lb 12.8 oz (97.4 kg)    Physical Exam Vitals reviewed.  Constitutional:      General: She is not in acute distress.    Appearance: Normal appearance. She is obese. She is not ill-appearing, toxic-appearing or diaphoretic.  HENT:     Head: Normocephalic and atraumatic.  Eyes:     General: No scleral icterus.       Right eye: No discharge.        Left eye: No discharge.     Conjunctiva/sclera: Conjunctivae normal.  Cardiovascular:     Rate and Rhythm: Normal rate and regular rhythm.     Heart sounds: Normal heart sounds. No murmur heard.  No friction rub. No gallop.   Pulmonary:     Effort: Pulmonary effort is normal. No respiratory distress.     Breath sounds: Normal breath sounds. No stridor. No wheezing, rhonchi  or rales.  Musculoskeletal:        General: Normal range of motion.     Cervical back: Normal range of motion.  Skin:    General: Skin is warm and dry.     Capillary Refill: Capillary refill takes less than 2 seconds.  Neurological:     General: No focal deficit present.     Mental Status: She is alert and oriented to person, place, and time. Mental status is at baseline.  Psychiatric:        Mood and Affect: Mood normal.        Behavior: Behavior normal.        Thought Content: Thought content normal.        Judgment: Judgment normal.     Lab Results  Component Value Date   TSH 1.060 06/22/2019   Lab Results  Component Value Date   WBC 4.5 05/10/2019   HGB 13.2 05/10/2019   HCT 41.5 05/10/2019   MCV 84 05/10/2019   PLT 235 05/10/2019   Lab Results  Component Value Date   NA 140 05/10/2019   K 4.8 05/10/2019   CO2 23 05/10/2019   GLUCOSE 83 05/10/2019   BUN 14 05/10/2019   CREATININE 0.70 05/10/2019   BILITOT 0.3 05/10/2019   ALKPHOS 81 05/10/2019   AST 15 05/10/2019   ALT 13 05/10/2019   PROT 6.9 05/10/2019   ALBUMIN 3.9 05/10/2019   CALCIUM 9.0 05/10/2019   Lab Results  Component Value Date   CHOL 172 05/10/2019   Lab Results  Component Value Date   HDL 43 05/10/2019   Lab Results  Component Value Date   LDLCALC 114 (H) 05/10/2019   Lab Results  Component Value Date   TRIG 79 05/10/2019   Lab Results  Component Value Date   CHOLHDL 4.0 05/10/2019   No results found for: HGBA1C

## 2019-07-29 LAB — URINE CULTURE

## 2019-09-01 ENCOUNTER — Other Ambulatory Visit: Payer: Self-pay | Admitting: *Deleted

## 2019-09-01 DIAGNOSIS — E039 Hypothyroidism, unspecified: Secondary | ICD-10-CM

## 2019-09-01 MED ORDER — LEVOTHYROXINE SODIUM 125 MCG PO TABS: 125.0000 ug | ORAL_TABLET | Freq: Every day | ORAL | 3 refills | Status: DC

## 2019-09-14 ENCOUNTER — Encounter: Payer: Self-pay | Admitting: Urology

## 2019-09-14 ENCOUNTER — Other Ambulatory Visit: Payer: Self-pay

## 2019-09-14 ENCOUNTER — Ambulatory Visit (INDEPENDENT_AMBULATORY_CARE_PROVIDER_SITE_OTHER): Payer: Medicare HMO | Admitting: Urology

## 2019-09-14 VITALS — BP 147/82 | HR 86 | Temp 97.8°F | Ht 69.0 in | Wt 198.0 lb

## 2019-09-14 DIAGNOSIS — N3021 Other chronic cystitis with hematuria: Secondary | ICD-10-CM | POA: Diagnosis not present

## 2019-09-14 DIAGNOSIS — N3941 Urge incontinence: Secondary | ICD-10-CM | POA: Diagnosis not present

## 2019-09-14 LAB — URINALYSIS, ROUTINE W REFLEX MICROSCOPIC
Bilirubin, UA: NEGATIVE
Glucose, UA: NEGATIVE
Ketones, UA: NEGATIVE
Nitrite, UA: NEGATIVE
Protein,UA: NEGATIVE
RBC, UA: NEGATIVE
Specific Gravity, UA: 1.015 (ref 1.005–1.030)
Urobilinogen, Ur: 0.2 mg/dL (ref 0.2–1.0)
pH, UA: 6 (ref 5.0–7.5)

## 2019-09-14 LAB — MICROSCOPIC EXAMINATION
Bacteria, UA: NONE SEEN
RBC, Urine: NONE SEEN /hpf (ref 0–2)

## 2019-09-14 NOTE — Progress Notes (Signed)
09/14/2019 9:08 AM   Janet Gardner [birth year] [id number]  Referring provider: Gwenlyn Fudge, FNP [address] [place],  [place] [address]  Recurrent UTI  HPI: Ms Hole is a [phone] here for evaluation of recurrent UTI. She has had 3 documented UTIs in the past 6 months. 2 cultures grew pseudomonas and last culture grew E coli. Patient has a hx of a endopyelotomy by Dr. Earney Mallet. No hx of nephrolithiasis. She has dysuria, urinary frequency, urgency and foamy urine with the UTIs. NO hematuria   PMH: Past Medical History:  Diagnosis Date   B12 deficiency    monthly injection   Fibromyalgia    GERD (gastroesophageal reflux disease)    Hypothyroidism     Surgical History: Past Surgical History:  Procedure Laterality Date   ABDOMINAL HYSTERECTOMY     CHOLECYSTECTOMY     colonoscopy  2007   Dr. Laurell Josephs, incomplete due to poor prep. scope passed to transverse colon. ACBE normal.   ESOPHAGOGASTRODUODENOSCOPY  07/22/2009   HYI:FOYDXA apperaring esophagus s/p 56 dilator/small HH/large ulcerated gastric polyp in the prepyloric antral area. Inflammatory fibroid polyp.   ESOPHAGOGASTRODUODENOSCOPY (EGD) WITH ESOPHAGEAL DILATION N/A 11/24/2012   Procedure: ESOPHAGOGASTRODUODENOSCOPY (EGD) WITH ESOPHAGEAL DILATION;  Surgeon: Corbin Ade, MD;  [facility]: [facility];  Service: Endoscopy;  Laterality: N/A;  9:30AM   FOOT SURGERY     Right   KIDNEY SURGERY      Home Medications:  Allergies as of 09/14/2019      Reactions   Levaquin [levofloxacin] Nausea And Vomiting   Sulfa Antibiotics    Vomiting      Medication List       Accurate as of September 14, 2019  9:08 AM. If you have any questions, ask your nurse or doctor.        atorvastatin 20 MG tablet Commonly known as: LIPITOR Take 1 tablet (20 mg total) by mouth daily.   levothyroxine 125 MCG tablet Commonly known as: Synthroid Take 1 tablet (125 mcg total) by mouth daily before breakfast.   meloxicam 7.5 MG  tablet Commonly known as: MOBIC Take 1 tablet (7.5 mg total) by mouth daily.   mupirocin ointment 2 % Commonly known as: BACTROBAN Apply 1 application topically as needed.   Vitamin D3 1.25 MG (50000 UT) Tabs Take 1 tablet by mouth once a week.       Allergies:  Allergies  Allergen Reactions   Levaquin [Levofloxacin] Nausea And Vomiting   Sulfa Antibiotics     Vomiting    Family History: Family History  Problem Relation Age of Onset   Breast cancer Mother    Heart disease Father    Colon cancer Neg Hx     Social History:  reports that she has never smoked. She has never used smokeless tobacco. She reports that she does not drink alcohol and does not use drugs.  ROS: All other review of systems were reviewed and are negative except what is noted above in HPI  Physical Exam: BP (!) 147/82    Pulse 86    Temp 97.8 F (36.6 C)    Ht 5\' 9"  (1.753 m)    Wt 198 lb (89.8 kg)    BMI 29.24 kg/m   Constitutional:  Alert and oriented, No acute distress. HEENT: [place] AT, moist mucus membranes.  Trachea midline, no masses. Cardiovascular: No clubbing, cyanosis, or edema. Respiratory: Normal respiratory effort, no increased work of breathing. GI: Abdomen is soft, nontender, nondistended, no abdominal  masses GU: No CVA tenderness.  Lymph: No cervical or inguinal lymphadenopathy. Skin: No rashes, bruises or suspicious lesions. Neurologic: Grossly intact, no focal deficits, moving all 4 extremities. Psychiatric: Normal mood and affect.  Laboratory Data: Lab Results  Component Value Date   WBC 4.5 05/10/2019   HGB 13.2 05/10/2019   HCT 41.5 05/10/2019   MCV 84 05/10/2019   PLT 235 05/10/2019    Lab Results  Component Value Date   CREATININE 0.70 05/10/2019    No results found for: PSA  No results found for: TESTOSTERONE  No results found for: HGBA1C  Urinalysis    Component Value Date/Time   COLORURINE YELLOW 02/10/2011 0953   APPEARANCEUR Hazy (A) 07/26/2019  1601   LABSPEC 1.010 02/10/2011 0953   PHURINE 8.0 02/10/2011 0953   GLUCOSEU Negative 07/26/2019 1601   HGBUR NEGATIVE 02/10/2011 0953   BILIRUBINUR Negative 07/26/2019 1601   KETONESUR NEGATIVE 02/10/2011 0953   PROTEINUR Trace (A) 07/26/2019 1601   PROTEINUR NEGATIVE 02/10/2011 0953   UROBILINOGEN 1.0 02/10/2011 0953   NITRITE Negative 07/26/2019 1601   NITRITE NEGATIVE 02/10/2011 0953   LEUKOCYTESUR 3+ (A) 07/26/2019 1601    Lab Results  Component Value Date   LABMICR See below: 07/26/2019   WBCUA >30 (A) 07/26/2019   LABEPIT 0-10 07/26/2019   MUCUS Present 03/21/2019   BACTERIA Many (A) 07/26/2019    Pertinent Imaging:  No results found for this or any previous visit.  No results found for this or any previous visit.  No results found for this or any previous visit.  No results found for this or any previous visit.  No results found for this or any previous visit.  No results found for this or any previous visit.  No results found for this or any previous visit.  No results found for this or any previous visit.   Assessment & Plan:    1. . Chronic cystitis with hematuria -BMP -CT hematuria -office cystoscopy   No follow-ups on file.  Wilkie Aye, MD  [facility] [place]

## 2019-09-14 NOTE — Patient Instructions (Signed)

## 2019-09-14 NOTE — Progress Notes (Signed)
Urological Symptom Review  Patient is experiencing the following symptoms: Frequent urination Hard to postpone urination Get up at night to urinate Leakage of urine Urinary tract infection   Review of Systems  Gastrointestinal (upper)  : Negative for upper GI symptoms  Gastrointestinal (lower) : Negative for lower GI symptoms  Constitutional : Negative for symptoms  Skin: Negative for skin symptoms  Eyes: Negative for eye symptoms  Ear/Nose/Throat : Negative for Ear/Nose/Throat symptoms  Hematologic/Lymphatic: Negative for Hematologic/Lymphatic symptoms  Cardiovascular : Negative for cardiovascular symptoms  Respiratory : Negative for respiratory symptoms  Endocrine: Negative for endocrine symptoms  Musculoskeletal: Negative for musculoskeletal symptoms  Neurological: Negative for neurological symptoms  Psychologic: Negative for psychiatric symptoms  

## 2019-09-15 LAB — BASIC METABOLIC PANEL
BUN/Creatinine Ratio: 18 (ref 12–28)
BUN: 12 mg/dL (ref 8–27)
CO2: 23 mmol/L (ref 20–29)
Calcium: 8.5 mg/dL — ABNORMAL LOW (ref 8.7–10.3)
Chloride: 106 mmol/L (ref 96–106)
Creatinine, Ser: 0.66 mg/dL (ref 0.57–1.00)
GFR calc Af Amer: 106 mL/min/{1.73_m2} (ref >59)
GFR calc non Af Amer: 92 mL/min/{1.73_m2} (ref >59)
Glucose: 116 mg/dL — ABNORMAL HIGH (ref 65–99)
Potassium: 4.2 mmol/L (ref 3.5–5.2)
Sodium: 140 mmol/L (ref 134–144)

## 2019-10-10 ENCOUNTER — Ambulatory Visit (HOSPITAL_COMMUNITY)
Admission: RE | Admit: 2019-10-10 | Discharge: 2019-10-10 | Disposition: A | Discharge status: Home / Self Care | Payer: Medicare HMO | Source: Ambulatory Visit | Attending: Urology | Admitting: Urology

## 2019-10-10 ENCOUNTER — Other Ambulatory Visit: Payer: Self-pay

## 2019-10-10 DIAGNOSIS — N390 Urinary tract infection, site not specified: Secondary | ICD-10-CM | POA: Diagnosis not present

## 2019-10-10 DIAGNOSIS — N3021 Other chronic cystitis with hematuria: Secondary | ICD-10-CM | POA: Insufficient documentation

## 2019-10-10 MED ORDER — IOHEXOL 300 MG/ML  SOLN
125.0000 mL | Freq: Once | INTRAMUSCULAR | Status: AC | PRN
  Administered 2019-10-10: 125 mL via INTRAVENOUS

## 2019-10-13 NOTE — Progress Notes (Signed)
Notified via mychart.

## 2019-10-17 ENCOUNTER — Ambulatory Visit (INDEPENDENT_AMBULATORY_CARE_PROVIDER_SITE_OTHER): Payer: Medicare HMO | Admitting: Urology

## 2019-10-17 ENCOUNTER — Encounter: Payer: Self-pay | Admitting: Urology

## 2019-10-17 ENCOUNTER — Other Ambulatory Visit: Payer: Self-pay

## 2019-10-17 VITALS — BP 176/98 | HR 55 | Temp 97.8°F | Ht 69.0 in | Wt 198.0 lb

## 2019-10-17 DIAGNOSIS — N3021 Other chronic cystitis with hematuria: Secondary | ICD-10-CM | POA: Diagnosis not present

## 2019-10-17 LAB — URINALYSIS, ROUTINE W REFLEX MICROSCOPIC
Bilirubin, UA: NEGATIVE
Glucose, UA: NEGATIVE
Ketones, UA: NEGATIVE
Nitrite, UA: NEGATIVE
Protein,UA: NEGATIVE
RBC, UA: NEGATIVE
Specific Gravity, UA: 1.025 (ref 1.005–1.030)
Urobilinogen, Ur: 1 mg/dL (ref 0.2–1.0)
pH, UA: 5 (ref 5.0–7.5)

## 2019-10-17 LAB — MICROSCOPIC EXAMINATION: RBC, Urine: NONE SEEN /hpf (ref 0–2)

## 2019-10-17 MED ORDER — NITROFURANTOIN MACROCRYSTAL 50 MG PO CAPS: 50.0000 mg | ORAL_CAPSULE | Freq: Every evening | ORAL | 11 refills | Status: DC

## 2019-10-17 MED ORDER — NITROFURANTOIN MONOHYD MACRO 100 MG PO CAPS: 100.0000 mg | ORAL_CAPSULE | Freq: Once | ORAL | 0 refills | Status: AC

## 2019-10-17 NOTE — Patient Instructions (Signed)
Urinary Tract Infection, Adult A urinary tract infection (UTI) is an infection of any part of the urinary tract. The urinary tract includes:  The kidneys.  The ureters.  The bladder.  The urethra. These organs make, store, and get rid of pee (urine) in the body. What are the causes? This is caused by germs (bacteria) in your genital area. These germs grow and cause swelling (inflammation) of your urinary tract. What increases the risk? You are more likely to develop this condition if:  You have a small, thin tube (catheter) to drain pee.  You cannot control when you pee or poop (incontinence).  You are female, and: ? You use these methods to prevent pregnancy:  A medicine that kills sperm (spermicide).  A device that blocks sperm (diaphragm). ? You have low levels of a female hormone (estrogen). ? You are pregnant.  You have genes that add to your risk.  You are sexually active.  You take antibiotic medicines.  You have trouble peeing because of: ? A prostate that is bigger than normal, if you are female. ? A blockage in the part of your body that drains pee from the bladder (urethra). ? A kidney stone. ? A nerve condition that affects your bladder (neurogenic bladder). ? Not getting enough to drink. ? Not peeing often enough.  You have other conditions, such as: ? Diabetes. ? A weak disease-fighting system (immune system). ? Sickle cell disease. ? Gout. ? Injury of the spine. What are the signs or symptoms? Symptoms of this condition include:  Needing to pee right away (urgently).  Peeing often.  Peeing small amounts often.  Pain or burning when peeing.  Blood in the pee.  Pee that smells bad or not like normal.  Trouble peeing.  Pee that is cloudy.  Fluid coming from the vagina, if you are female.  Pain in the belly or lower back. Other symptoms include:  Throwing up (vomiting).  No urge to eat.  Feeling mixed up (confused).  Being tired  and grouchy (irritable).  A fever.  Watery poop (diarrhea). How is this treated? This condition may be treated with:  Antibiotic medicine.  Other medicines.  Drinking enough water. Follow these instructions at home:  Medicines  Take over-the-counter and prescription medicines only as told by your doctor.  If you were prescribed an antibiotic medicine, take it as told by your doctor. Do not stop taking it even if you start to feel better. General instructions  Make sure you: ? Pee until your bladder is empty. ? Do not hold pee for a long time. ? Empty your bladder after sex. ? Wipe from front to back after pooping if you are a female. Use each tissue one time when you wipe.  Drink enough fluid to keep your pee pale yellow.  Keep all follow-up visits as told by your doctor. This is important. Contact a doctor if:  You do not get better after 1-2 days.  Your symptoms go away and then come back. Get help right away if:  You have very bad back pain.  You have very bad pain in your lower belly.  You have a fever.  You are sick to your stomach (nauseous).  You are throwing up. Summary  A urinary tract infection (UTI) is an infection of any part of the urinary tract.  This condition is caused by germs in your genital area.  There are many risk factors for a UTI. These include having a small, thin   tube to drain pee and not being able to control when you pee or poop.  Treatment includes antibiotic medicines for germs.  Drink enough fluid to keep your pee pale yellow. This information is not intended to replace advice given to you by your health care provider. Make sure you discuss any questions you have with your health care provider. Document Revised: 01/13/2018 Document Reviewed: 08/05/2017 Elsevier Patient Education  2020 [employer]  

## 2019-10-17 NOTE — Progress Notes (Signed)
10/17/2019 4:33 PM   Janet Gardner [birth year] [id number]  Referring provider: Gwenlyn Fudge, FNP [address] [place],  [place] [address]  Recurrent UTI  HPI: [name] is a 29SH here for followup for recurrent UTIs. Ct hematuria protocol showed no GU abnormalities. She denies any significant LUTS. No hematuria or dysuria   PMH: Past Medical History:  Diagnosis Date   B12 deficiency    monthly injection   Fibromyalgia    GERD (gastroesophageal reflux disease)    Hypothyroidism     Surgical History: Past Surgical History:  Procedure Laterality Date   ABDOMINAL HYSTERECTOMY     CHOLECYSTECTOMY     colonoscopy  2007   Dr. Laurell Josephs, incomplete due to poor prep. scope passed to transverse colon. ACBE normal.   ESOPHAGOGASTRODUODENOSCOPY  07/22/2009   FWY:OVZCHY apperaring esophagus s/p 56 dilator/small HH/large ulcerated gastric polyp in the prepyloric antral area. Inflammatory fibroid polyp.   ESOPHAGOGASTRODUODENOSCOPY (EGD) WITH ESOPHAGEAL DILATION N/A 11/24/2012   Procedure: ESOPHAGOGASTRODUODENOSCOPY (EGD) WITH ESOPHAGEAL DILATION;  Surgeon: Corbin Ade, MD;  [facility];  Service: Endoscopy;  Laterality: N/A;  9:30AM   FOOT SURGERY     Right   KIDNEY SURGERY      Home Medications:  Allergies as of 10/17/2019      Reactions   Levaquin [levofloxacin] Nausea And Vomiting   Sulfa Antibiotics    Vomiting      Medication List       Accurate as of October 17, 2019  4:33 PM. If you have any questions, ask your nurse or doctor.        atorvastatin 20 MG tablet Commonly known as: LIPITOR Take 1 tablet (20 mg total) by mouth daily.   levothyroxine 125 MCG tablet Commonly known as: Synthroid Take 1 tablet (125 mcg total) by mouth daily before breakfast.   meloxicam 7.5 MG tablet Commonly known as: MOBIC Take 1 tablet (7.5 mg total) by mouth daily.   mupirocin ointment 2 % Commonly known as: BACTROBAN Apply 1 application topically as  needed.   Vitamin D3 1.25 MG (50000 UT) Tabs Take 1 tablet by mouth once a week.       Allergies:  Allergies  Allergen Reactions   Levaquin [Levofloxacin] Nausea And Vomiting   Sulfa Antibiotics     Vomiting    Family History: Family History  Problem Relation Age of Onset   Breast cancer Mother    Heart disease Father    Colon cancer Neg Hx     Social History:  reports that she has never smoked. She has never used smokeless tobacco. She reports that she does not drink alcohol and does not use drugs.  ROS: All other review of systems were reviewed and are negative except what is noted above in HPI  Physical Exam: BP (!) 176/98    Pulse (!) 55    Temp 97.8 F (36.6 C)    Ht 5\' 9"  (1.753 m)    Wt 198 lb (89.8 kg)    BMI 29.24 kg/m   Constitutional:  Alert and oriented, No acute distress. HEENT: [place] AT, moist mucus membranes.  Trachea midline, no masses. Cardiovascular: No clubbing, cyanosis, or edema. Respiratory: Normal respiratory effort, no increased work of breathing. GI: Abdomen is soft, nontender, nondistended, no abdominal masses GU: No CVA tenderness.  Lymph: No cervical or inguinal lymphadenopathy. Skin: No rashes, bruises or suspicious lesions. Neurologic: Grossly intact, no focal deficits, moving all 4 extremities. Psychiatric: Normal mood  and affect.  Laboratory Data: Lab Results  Component Value Date   WBC 4.5 05/10/2019   HGB 13.2 05/10/2019   HCT 41.5 05/10/2019   MCV 84 05/10/2019   PLT 235 05/10/2019    Lab Results  Component Value Date   CREATININE 0.66 09/14/2019    No results found for: PSA  No results found for: TESTOSTERONE  No results found for: HGBA1C  Urinalysis    Component Value Date/Time   COLORURINE YELLOW 02/10/2011 0953   APPEARANCEUR Clear 10/17/2019 1559   LABSPEC 1.010 02/10/2011 0953   PHURINE 8.0 02/10/2011 0953   GLUCOSEU Negative 10/17/2019 1559   HGBUR NEGATIVE 02/10/2011 0953   BILIRUBINUR Negative  10/17/2019 1559   KETONESUR NEGATIVE 02/10/2011 0953   PROTEINUR Negative 10/17/2019 1559   PROTEINUR NEGATIVE 02/10/2011 0953   UROBILINOGEN 1.0 02/10/2011 0953   NITRITE Negative 10/17/2019 1559   NITRITE NEGATIVE 02/10/2011 0953   LEUKOCYTESUR 1+ (A) 10/17/2019 1559    Lab Results  Component Value Date   LABMICR See below: 10/17/2019   WBCUA 11-30 (A) 10/17/2019   LABEPIT 0-10 10/17/2019   MUCUS Present 03/21/2019   BACTERIA Few 10/17/2019    Pertinent Imaging: CT hematuria 8/3/12021: Images reviewed and discussed with the patient No results found for this or any previous visit.  No results found for this or any previous visit.  No results found for this or any previous visit.  No results found for this or any previous visit.  No results found for this or any previous visit.  No results found for this or any previous visit.  Results for orders placed during the hospital encounter of 10/10/19  CT HEMATURIA WORKUP  Narrative CLINICAL DATA:  Recurrent urinary tract infections.  EXAM: CT ABDOMEN AND PELVIS WITHOUT AND WITH CONTRAST  TECHNIQUE: Multidetector CT imaging of the abdomen and pelvis was performed following the standard protocol before and following the bolus administration of intravenous contrast.  CONTRAST:  OMNIPAQUE IOHEXOL 300 MG/ML  SOLN  COMPARISON:  02/10/2011  FINDINGS: Lower Chest: No acute findings.  Hepatobiliary: No hepatic masses identified. Prior cholecystectomy. No evidence of biliary obstruction.  Pancreas:  No mass or inflammatory changes.  Spleen: Within normal limits in size and appearance.  Adrenals/Urinary Tract: No adrenal masses identified. No evidence of urolithiasis or hydronephrosis. No renal masses identified. No masses seen involving the collecting systems, ureters, or bladder.  Stomach/Bowel: No evidence of obstruction, inflammatory process or abnormal fluid collections.  Vascular/Lymphatic: No  pathologically enlarged lymph nodes. No abdominal aortic aneurysm. Aortic atherosclerosis noted.  Reproductive: Prior hysterectomy noted. Adnexal regions are unremarkable in appearance.  Other:  None.  Musculoskeletal:  No suspicious bone lesions identified.  IMPRESSION: No radiographic evidence of urinary tract neoplasm, urolithiasis, or hydronephrosis. No other significant abnormality identified.  Aortic Atherosclerosis (ICD10-I70.0).   Electronically Signed By: Danae Orleans M.D. On: 10/11/2019 09:45  No results found for this or any previous visit.     Cystoscopy Procedure Note  Patient identification was confirmed, informed consent was obtained, and patient was prepped using Betadine solution.  Lidocaine jelly was administered per urethral meatus.    Procedure: - Flexible cystoscope introduced, without any difficulty.   - Thorough search of the bladder revealed:    normal urethral meatus    normal urothelium    no stones    no ulcers     no tumors    no urethral polyps    no trabeculation  - Ureteral orifices were normal in  position and appearance.  Post-Procedure: - Patient tolerated the procedure well    Assessment & Plan:    1. Chronic cystitis with hematuria -We will start nitrfurintoin 50mg  qhs -RTC 3 months - Urinalysis, Routine w reflex microscopic   No follow-ups on file.  , MD  [facility] [place]

## 2019-10-17 NOTE — Progress Notes (Signed)
Urological Symptom Review  Patient is experiencing the following symptoms: Frequent urination Hard to postpone urination Get up at night to urinate Leakage of urine Urinary tract infection   Review of Systems  Gastrointestinal (upper)  : Negative for upper GI symptoms  Gastrointestinal (lower) : Negative for lower GI symptoms  Constitutional : Negative for symptoms  Skin: Negative for skin symptoms  Eyes: Negative for eye symptoms  Ear/Nose/Throat : Sinus problems  Hematologic/Lymphatic: Negative for Hematologic/Lymphatic symptoms  Cardiovascular : Negative for cardiovascular symptoms  Respiratory : Negative for respiratory symptoms  Endocrine: Negative for endocrine symptoms  Musculoskeletal: Negative for musculoskeletal symptoms  Neurological: Negative for neurological symptoms  Psychologic: Negative for psychiatric symptoms

## 2019-10-27 ENCOUNTER — Ambulatory Visit (INDEPENDENT_AMBULATORY_CARE_PROVIDER_SITE_OTHER): Payer: Medicare HMO | Admitting: Family Medicine

## 2019-10-27 ENCOUNTER — Encounter: Payer: Self-pay | Admitting: Family Medicine

## 2019-10-27 ENCOUNTER — Other Ambulatory Visit: Payer: Self-pay | Admitting: Family Medicine

## 2019-10-27 ENCOUNTER — Other Ambulatory Visit: Payer: Self-pay

## 2019-10-27 VITALS — BP 157/91 | HR 83 | Temp 97.3°F | Ht 69.0 in | Wt 209.2 lb

## 2019-10-27 DIAGNOSIS — E785 Hyperlipidemia, unspecified: Secondary | ICD-10-CM

## 2019-10-27 DIAGNOSIS — M797 Fibromyalgia: Secondary | ICD-10-CM | POA: Diagnosis not present

## 2019-10-27 DIAGNOSIS — R030 Elevated blood-pressure reading, without diagnosis of hypertension: Secondary | ICD-10-CM | POA: Diagnosis not present

## 2019-10-27 DIAGNOSIS — E039 Hypothyroidism, unspecified: Secondary | ICD-10-CM | POA: Diagnosis not present

## 2019-10-27 DIAGNOSIS — Z23 Encounter for immunization: Secondary | ICD-10-CM

## 2019-10-27 DIAGNOSIS — E538 Deficiency of other specified B group vitamins: Secondary | ICD-10-CM

## 2019-10-27 DIAGNOSIS — Z0000 Encounter for general adult medical examination without abnormal findings: Secondary | ICD-10-CM

## 2019-10-27 MED ORDER — MELOXICAM 15 MG PO TABS: 15.0000 mg | ORAL_TABLET | Freq: Every day | ORAL | 2 refills | Status: DC

## 2019-10-27 MED ORDER — GABAPENTIN 300 MG PO CAPS: 300.0000 mg | ORAL_CAPSULE | Freq: Every day | ORAL | 2 refills | Status: DC

## 2019-10-27 NOTE — Progress Notes (Signed)
Assessment & Plan:  1. Elevated blood pressure reading without diagnosis of hypertension - Patient reports good BP readings at home.   2. Dyslipidemia - Labs today to assess since patient has been off the atorvastatin. - Lipid panel - CMP14+EGFR  3. Fibromyalgia - Uncontrolled. Meloxicam increased from 7.5 mg to 15 mg once daily. Also gave gabapentin to trial QHS.  - meloxicam (MOBIC) 15 MG tablet; Take 1 tablet (15 mg total) by mouth daily.  Dispense: 30 tablet; Refill: 2 - gabapentin (NEURONTIN) 300 MG capsule; Take 1 capsule (300 mg total) by mouth at bedtime.  Dispense: 30 capsule; Refill: 2 - CMP14+EGFR  4. Vitamin B12 deficiency - Labs to assess for need of B12 supplementation. - Vitamin B12 - CMP14+EGFR  5. Acquired hypothyroidism - CMP14+EGFR - TSH  6. Need for immunization against influenza - Flu Vaccine QUAD High Dose(Fluad)  7. Healthcare maintenance - Patient will get colonoscopy after she pays off some of her medical bills. Declined pneumonia vaccine.    Return in about 6 weeks (around 12/08/2019) for fibromyalgia.  Hendricks Limes, MSN, APRN, FNP-C [facility]  Subjective:    Patient ID: Janet Gardner, female    DOB: Oct 10, 1955, 64 y.o.   MRN: [id number]  Patient Care Team: Loman Brooklyn, FNP as PCP - General (Family Medicine) Gala Romney Cristopher Estimable, MD as Consulting Physician (Gastroenterology)   Chief Complaint:  Chief Complaint  Patient presents with  . Hypertension    3 month follow up of chronic medical conditions  . Leg Pain    Patient states she has been having ongoing bilateral leg and feet pain that is getting worse.    HPI: Janet Gardner is a 64 y.o. female presenting on 10/27/2019 for Hypertension (3 month follow up of chronic medical conditions) and Leg Pain (Patient states she has been having ongoing bilateral leg and feet pain that is getting worse.)  Patient reports BP at home is always 130s/70s. She is always  elevated when she comes to the doctor.   Patient reports she quit taking atorvastatin months ago due to muscle pains.   New complaints: She reports bilateral leg and feet pain that is getting worse. She feels this is related to her fibromyalgia. She is taking meloxicam 7.5 mg once daily which helps some.   She is requesting a B12 injection today.   Social history:  Relevant past medical, surgical, family and social history reviewed and updated as indicated. Interim medical history since our last visit reviewed.  Allergies and medications reviewed and updated.  DATA REVIEWED: CHART IN EPIC  ROS: Negative unless specifically indicated above in HPI.    Current Outpatient Medications:  .  Cholecalciferol (VITAMIN D3) 1.25 MG (50000 UT) TABS, Take 1 tablet by mouth once a week., Disp: 15 tablet, Rfl: 1 .  levothyroxine (SYNTHROID) 125 MCG tablet, Take 1 tablet (125 mcg total) by mouth daily before breakfast., Disp: 90 tablet, Rfl: 3 .  meloxicam (MOBIC) 7.5 MG tablet, Take 1 tablet (7.5 mg total) by mouth daily., Disp: 90 tablet, Rfl: 1 .  mupirocin ointment (BACTROBAN) 2 %, Apply 1 application topically as needed., Disp: , Rfl:  .  nitrofurantoin (MACRODANTIN) 50 MG capsule, Take 1 capsule (50 mg total) by mouth at bedtime., Disp: 30 capsule, Rfl: 11   Allergies  Allergen Reactions  . Levaquin [Levofloxacin] Nausea And Vomiting  . Sulfa Antibiotics     Vomiting   Past Medical History:  Diagnosis Date  . B12  deficiency    monthly injection  . Fibromyalgia   . GERD (gastroesophageal reflux disease)   . Hypothyroidism     Past Surgical History:  Procedure Laterality Date  . ABDOMINAL HYSTERECTOMY    . CHOLECYSTECTOMY    . colonoscopy  2007   Dr. Lavone Neri, incomplete due to poor prep. scope passed to transverse colon. ACBE normal.  . ESOPHAGOGASTRODUODENOSCOPY  07/22/2009   AJG:OTLXBW apperaring esophagus s/p 56 dilator/small HH/large ulcerated gastric polyp in the prepyloric  antral area. Inflammatory fibroid polyp.  . ESOPHAGOGASTRODUODENOSCOPY (EGD) WITH ESOPHAGEAL DILATION N/A 11/24/2012   Procedure: ESOPHAGOGASTRODUODENOSCOPY (EGD) WITH ESOPHAGEAL DILATION;  Surgeon: Daneil Dolin, MD;  Location: [facility];  Service: Endoscopy;  Laterality: N/A;  9:30AM  . FOOT SURGERY     Right  . KIDNEY SURGERY      Social History   Socioeconomic History  . Marital status: Married    Spouse name: Not on file  . Number of children: 1  . Years of education: Not on file  . Highest education level: Not on file  Occupational History  . Not on file  Tobacco Use  . Smoking status: Never Smoker  . Smokeless tobacco: Never Used  Vaping Use  . Vaping Use: Never used  Substance and Sexual Activity  . Alcohol use: No  . Drug use: No  . Sexual activity: Not on file  Other Topics Concern  . Not on file  Social History Narrative  . Not on file   Social Determinants of Health   Financial Resource Strain:   . Difficulty of Paying Living Expenses: Not on file  Food Insecurity:   . Worried About Charity fundraiser in the Last Year: Not on file  . Ran Out of Food in the Last Year: Not on file  Transportation Needs:   . Lack of Transportation (Medical): Not on file  . Lack of Transportation (Non-Medical): Not on file  Physical Activity:   . Days of Exercise per Week: Not on file  . Minutes of Exercise per Session: Not on file  Stress:   . Feeling of Stress : Not on file  Social Connections:   . Frequency of Communication with Friends and Family: Not on file  . Frequency of Social Gatherings with Friends and Family: Not on file  . Attends Religious Services: Not on file  . Active Member of Clubs or Organizations: Not on file  . Attends Archivist Meetings: Not on file  . Marital Status: Not on file  Intimate Partner Violence:   . Fear of Current or Ex-Partner: Not on file  . Emotionally Abused: Not on file  . Physically Abused: Not on file  .  Sexually Abused: Not on file        Objective:    BP (!) 157/91   Pulse 83   Temp (!) 97.3 F (36.3 C) (Temporal)   Ht '5\' 9"'  (1.753 m)   Wt 209 lb 3.2 oz (94.9 kg)   SpO2 99%   BMI 30.89 kg/m   Wt Readings from Last 3 Encounters:  10/27/19 209 lb 3.2 oz (94.9 kg)  10/17/19 198 lb (89.8 kg)  09/14/19 198 lb (89.8 kg)    Physical Exam Vitals reviewed.  Constitutional:      General: She is not in acute distress.    Appearance: Normal appearance. She is obese. She is not ill-appearing, toxic-appearing or diaphoretic.  HENT:     Head: Normocephalic and atraumatic.  Eyes:  General: No scleral icterus.       Right eye: No discharge.        Left eye: No discharge.     Conjunctiva/sclera: Conjunctivae normal.  Cardiovascular:     Rate and Rhythm: Normal rate and regular rhythm.     Heart sounds: Normal heart sounds. No murmur heard.  No friction rub. No gallop.   Pulmonary:     Effort: Pulmonary effort is normal. No respiratory distress.     Breath sounds: Normal breath sounds. No stridor. No wheezing, rhonchi or rales.  Musculoskeletal:        General: Normal range of motion.     Cervical back: Normal range of motion.  Skin:    General: Skin is warm and dry.     Capillary Refill: Capillary refill takes less than 2 seconds.  Neurological:     General: No focal deficit present.     Mental Status: She is alert and oriented to person, place, and time. Mental status is at baseline.  Psychiatric:        Mood and Affect: Mood normal.        Behavior: Behavior normal.        Thought Content: Thought content normal.        Judgment: Judgment normal.     Lab Results  Component Value Date   TSH 1.060 06/22/2019   Lab Results  Component Value Date   WBC 4.5 05/10/2019   HGB 13.2 05/10/2019   HCT 41.5 05/10/2019   MCV 84 05/10/2019   PLT 235 05/10/2019   Lab Results  Component Value Date   NA 140 09/14/2019   K 4.2 09/14/2019   CO2 23 09/14/2019   GLUCOSE 116  (H) 09/14/2019   BUN 12 09/14/2019   CREATININE 0.66 09/14/2019   BILITOT 0.3 05/10/2019   ALKPHOS 81 05/10/2019   AST 15 05/10/2019   ALT 13 05/10/2019   PROT 6.9 05/10/2019   ALBUMIN 3.9 05/10/2019   CALCIUM 8.5 (L) 09/14/2019   Lab Results  Component Value Date   CHOL 172 05/10/2019   Lab Results  Component Value Date   HDL 43 05/10/2019   Lab Results  Component Value Date   LDLCALC 114 (H) 05/10/2019   Lab Results  Component Value Date   TRIG 79 05/10/2019   Lab Results  Component Value Date   CHOLHDL 4.0 05/10/2019   No results found for: HGBA1C

## 2019-10-28 LAB — CMP14+EGFR
ALT: 13 IU/L (ref 0–32)
AST: 14 IU/L (ref 0–40)
Albumin/Globulin Ratio: 1.5 (ref 1.2–2.2)
Albumin: 4.2 g/dL (ref 3.8–4.8)
Alkaline Phosphatase: 85 IU/L (ref 44–121)
BUN/Creatinine Ratio: 13 (ref 12–28)
BUN: 9 mg/dL (ref 8–27)
Bilirubin Total: 0.3 mg/dL (ref 0.0–1.2)
CO2: 23 mmol/L (ref 20–29)
Calcium: 8.8 mg/dL (ref 8.7–10.3)
Chloride: 103 mmol/L (ref 96–106)
Creatinine, Ser: 0.68 mg/dL (ref 0.57–1.00)
GFR calc Af Amer: 105 mL/min/{1.73_m2} (ref >59)
GFR calc non Af Amer: 91 mL/min/{1.73_m2} (ref >59)
Globulin, Total: 2.8 g/dL (ref 1.5–4.5)
Glucose: 100 mg/dL — ABNORMAL HIGH (ref 65–99)
Potassium: 4.2 mmol/L (ref 3.5–5.2)
Sodium: 138 mmol/L (ref 134–144)
Total Protein: 7 g/dL (ref 6.0–8.5)

## 2019-10-28 LAB — VITAMIN B12: Vitamin B-12: 172 pg/mL — ABNORMAL LOW (ref 232–1245)

## 2019-10-28 LAB — LIPID PANEL
Chol/HDL Ratio: 3.9 ratio (ref 0.0–4.4)
Cholesterol, Total: 187 mg/dL (ref 100–199)
HDL: 48 mg/dL (ref >39)
LDL Chol Calc (NIH): 124 mg/dL — ABNORMAL HIGH (ref 0–99)
Triglycerides: 80 mg/dL (ref 0–149)
VLDL Cholesterol Cal: 15 mg/dL (ref 5–40)

## 2019-10-28 LAB — TSH: TSH: 0.776 u[IU]/mL (ref 0.450–4.500)

## 2019-10-29 ENCOUNTER — Encounter: Payer: Self-pay | Admitting: Family Medicine

## 2019-11-28 ENCOUNTER — Other Ambulatory Visit: Payer: Self-pay

## 2019-11-28 ENCOUNTER — Encounter: Payer: Self-pay | Admitting: Nurse Practitioner

## 2019-11-28 ENCOUNTER — Ambulatory Visit (INDEPENDENT_AMBULATORY_CARE_PROVIDER_SITE_OTHER): Payer: Medicare HMO | Admitting: Nurse Practitioner

## 2019-11-28 VITALS — BP 142/84 | HR 73 | Temp 97.1°F | Resp 20 | Ht 69.0 in | Wt 210.0 lb

## 2019-11-28 DIAGNOSIS — M79604 Pain in right leg: Secondary | ICD-10-CM | POA: Diagnosis not present

## 2019-11-28 DIAGNOSIS — M79605 Pain in left leg: Secondary | ICD-10-CM | POA: Diagnosis not present

## 2019-11-28 MED ORDER — METHYLPREDNISOLONE ACETATE 80 MG/ML IJ SUSP
80.0000 mg | Freq: Once | INTRAMUSCULAR | Status: AC
  Administered 2019-11-28: 80 mg via INTRAMUSCULAR

## 2019-11-28 MED ORDER — PREDNISONE 10 MG (21) PO TBPK: ORAL_TABLET | ORAL | 0 refills | Status: DC

## 2019-11-28 MED ORDER — IBUPROFEN 600 MG PO TABS: 600.0000 mg | ORAL_TABLET | Freq: Three times a day (TID) | ORAL | 0 refills | Status: DC | PRN

## 2019-11-28 NOTE — Assessment & Plan Note (Signed)
Patient is a 64 year old female who presents to clinic for bilateral lower extremity pain, left leg greater than right.  Pain lateral left knee occurred 4 months ago.  Patient reports falling backwards at home and ever since have had pain in left leg.  Patient has tried Tylenol with little or no relief.  Patient has a history of fibromyalgia and on gabapentin. Provided education to patient with printed handouts given. Depo-Medrol shot given in clinic Started patient on prednisone pack, ibuprofen, apply ice, rest joint.  Follow-up with unresolved or worsening symptoms.

## 2019-11-28 NOTE — Progress Notes (Signed)
Acute Office Visit  Subjective:    Patient ID: Janet Gardner, female    DOB: [birth year], 64 y.o.   MRN: [id number]  Chief Complaint  Patient presents with  . foot and leg pain    bilateral - L > R     Leg Pain  Incident onset: more than 4  months ago. The incident occurred at home. Injury mechanism: fall. The pain is present in the left leg. The pain is at a severity of 7/10. The pain has been constant since onset. Associated symptoms include muscle weakness. Associated symptoms comments: Limited ROM. She reports no foreign bodies present. The symptoms are aggravated by movement. She has tried acetaminophen for the symptoms. The treatment provided mild relief.     Past Medical History:  Diagnosis Date  . B12 deficiency    monthly injection  . Fibromyalgia   . GERD (gastroesophageal reflux disease)   . Hypothyroidism     Past Surgical History:  Procedure Laterality Date  . ABDOMINAL HYSTERECTOMY    . CHOLECYSTECTOMY    . colonoscopy  2007   Dr. Laurell Josephs, incomplete due to poor prep. scope passed to transverse colon. ACBE normal.  . ESOPHAGOGASTRODUODENOSCOPY  07/22/2009   DUK:GURKYH apperaring esophagus s/p 56 dilator/small HH/large ulcerated gastric polyp in the prepyloric antral area. Inflammatory fibroid polyp.  . ESOPHAGOGASTRODUODENOSCOPY (EGD) WITH ESOPHAGEAL DILATION N/A 11/24/2012   Procedure: ESOPHAGOGASTRODUODENOSCOPY (EGD) WITH ESOPHAGEAL DILATION;  Surgeon: Corbin Ade, MD;  Location: [facility];  Service: Endoscopy;  Laterality: N/A;  9:30AM  . FOOT SURGERY     Right  . KIDNEY SURGERY      Family History  Problem Relation Age of Onset  . Breast cancer Mother   . Heart disease Father   . Colon cancer Neg Hx     Social History   Socioeconomic History  . Marital status: Married    Spouse name: Not on file  . Number of children: 1  . Years of education: Not on file  . Highest education level: Not on file  Occupational History  . Not on file    Tobacco Use  . Smoking status: Never Smoker  . Smokeless tobacco: Never Used  Vaping Use  . Vaping Use: Never used  Substance and Sexual Activity  . Alcohol use: No  . Drug use: No  . Sexual activity: Not on file  Other Topics Concern  . Not on file  Social History Narrative  . Not on file   Social Determinants of Health                                                                         Outpatient Medications Prior to Visit  Medication Sig Dispense Refill  . gabapentin (NEURONTIN) 300 MG capsule Take 1 capsule (300 mg total) by mouth at bedtime. 30 capsule 2  . levothyroxine (SYNTHROID) 125 MCG tablet Take 1 tablet (125 mcg total) by mouth daily before breakfast. 90 tablet 3  . nitrofurantoin (MACRODANTIN) 50 MG capsule Take 1 capsule (50 mg total) by mouth at bedtime. 30 capsule 11  . mupirocin ointment (BACTROBAN) 2 % Apply 1 application topically as needed. (Patient not taking: Reported on 11/28/2019)    . Cholecalciferol (  VITAMIN D3) 1.25 MG (50000 UT) TABS Take 1 tablet by mouth once a week. 15 tablet 1  . meloxicam (MOBIC) 15 MG tablet Take 1 tablet (15 mg total) by mouth daily. 30 tablet 2   No facility-administered medications prior to visit.    Allergies  Allergen Reactions  . Levaquin [Levofloxacin] Nausea And Vomiting  . Sulfa Antibiotics     Vomiting    Review of Systems  Musculoskeletal: Positive for joint swelling.  All other systems reviewed and are negative.      Objective:    Physical Exam Vitals reviewed.  Constitutional:      Appearance: Normal appearance.  HENT:     Head: Normocephalic.     Nose: Nose normal.  Eyes:     Conjunctiva/sclera: Conjunctivae normal.  Cardiovascular:     Rate and Rhythm: Normal rate and regular rhythm.     Pulses: Normal pulses.     Heart sounds: Normal heart sounds.  Pulmonary:     Effort: Pulmonary effort is normal.     Breath sounds: Normal breath sounds.  Abdominal:      General: Bowel sounds are normal.  Musculoskeletal:        General: Tenderness and signs of injury present.  Skin:    General: Skin is warm.  Neurological:     Mental Status: She is alert and oriented to person, place, and time.  Psychiatric:        Mood and Affect: Mood normal.        Behavior: Behavior normal.     BP (!) 142/84   Pulse 73   Temp (!) 97.1 F (36.2 C)   Resp 20   Ht 5\' 9"  (1.753 m)   Wt 210 lb (95.3 kg)   SpO2 97%   BMI 31.01 kg/m  Wt Readings from Last 3 Encounters:  11/28/19 210 lb (95.3 kg)  10/27/19 209 lb 3.2 oz (94.9 kg)  10/17/19 198 lb (89.8 kg)      Lab Results  Component Value Date   TSH 0.776 10/27/2019   Lab Results  Component Value Date   WBC 4.5 05/10/2019   HGB 13.2 05/10/2019   HCT 41.5 05/10/2019   MCV 84 05/10/2019   PLT 235 05/10/2019   Lab Results  Component Value Date   NA 138 10/27/2019   K 4.2 10/27/2019   CO2 23 10/27/2019   GLUCOSE 100 (H) 10/27/2019   BUN 9 10/27/2019   CREATININE 0.68 10/27/2019   BILITOT 0.3 10/27/2019   ALKPHOS 85 10/27/2019   AST 14 10/27/2019   ALT 13 10/27/2019   PROT 7.0 10/27/2019   ALBUMIN 4.2 10/27/2019   CALCIUM 8.8 10/27/2019   Lab Results  Component Value Date   CHOL 187 10/27/2019   Lab Results  Component Value Date   HDL 48 10/27/2019   Lab Results  Component Value Date   LDLCALC 124 (H) 10/27/2019   Lab Results  Component Value Date   TRIG 80 10/27/2019   Lab Results  Component Value Date   CHOLHDL 3.9 10/27/2019   No results found for: HGBA1C     Assessment & Plan:   Problem List Items Addressed This Visit      Other   Pain in both lower extremities - Primary    Patient is a 64 year old female who presents to clinic for bilateral lower extremity pain, left leg greater than right.  Pain lateral left knee occurred 4 months ago.  Patient reports falling backwards at  home and ever since have had pain in left leg.  Patient has tried Tylenol with little or no  relief.  Patient has a history of fibromyalgia and on gabapentin. Provided education to patient with printed handouts given. Depo-Medrol shot given in clinic Started patient on prednisone pack, ibuprofen, apply ice, rest joint.  Follow-up with unresolved or worsening symptoms.           Meds ordered this encounter  Medications  . ibuprofen (ADVIL) 600 MG tablet    Sig: Take 1 tablet (600 mg total) by mouth every 8 (eight) hours as needed.    Dispense:  30 tablet    Refill:  0    Order Specific Question:   Supervising Provider    Answer:   Arville Care A [id number]  . predniSONE (STERAPRED UNI-PAK 21 TAB) 10 MG (21) TBPK tablet    Sig: Take 6  Tablet by mouth day 1, 5 tablet day 2, 4 tab day 3, 3 tablet day 4, 2 tablet day 5, 1 tablet day 6    Dispense:  1 each    Refill:  0    Order Specific Question:   Supervising Provider    Answer:   Arville Care A [[id number]]     Daryll Drown, NP

## 2019-11-28 NOTE — Patient Instructions (Signed)
Leg Cramps Leg cramps occur when one or more muscles tighten and you have no control over this tightening (involuntary muscle contraction). Muscle cramps can develop in any muscle, but the most common place is in the calf muscles of the leg. Those cramps can occur during exercise or when you are at rest. Leg cramps are painful, and they may last for a few seconds to a few minutes. Cramps may return several times before they finally stop. Usually, leg cramps are not caused by a serious medical problem. In many cases, the cause is not known. Some common causes include:  Excessive physical effort (overexertion), such as during intense exercise.  Overuse from repetitive motions, or doing the same thing over and over.  Staying in a certain position for a long period of time.  Improper preparation, form, or technique while performing a sport or an activity.  Dehydration.  Injury.  Side effects of certain medicines.  Abnormally low levels of minerals in your blood (electrolytes), especially potassium and calcium. This could result from: ? Pregnancy. ? Taking diuretic medicines. Follow these instructions at home: Eating and drinking  Drink enough fluid to keep your urine pale yellow. Staying hydrated may help prevent cramps.  Eat a healthy diet that includes plenty of nutrients to help your muscles function. A healthy diet includes fruits and vegetables, lean protein, whole grains, and low-fat or nonfat dairy products. Managing pain, stiffness, and swelling      Try massaging, stretching, and relaxing the affected muscle. Do this for several minutes at a time.  If directed, put ice on areas that are sore or painful after a cramp: ? Put ice in a plastic bag. ? Place a towel between your skin and the bag. ? Leave the ice on for 20 minutes, 2-3 times a day.  If directed, apply heat to muscles that are tense or tight. Do this before you exercise, or as often as told by your health care  provider. Use the heat source that your health care provider recommends, such as a moist heat pack or a heating pad. ? Place a towel between your skin and the heat source. ? Leave the heat on for 20-30 minutes. ? Remove the heat if your skin turns bright red. This is especially important if you are unable to feel pain, heat, or cold. You may have a greater risk of getting burned.  Try taking hot showers or baths to help relax tight muscles. General instructions  If you are having frequent leg cramps, avoid intense exercise for several days.  Take over-the-counter and prescription medicines only as told by your health care provider.  Keep all follow-up visits as told by your health care provider. This is important. Contact a health care provider if:  Your leg cramps get more severe or more frequent, or they do not improve over time.  Your foot becomes cold, numb, or blue. Summary  Muscle cramps can develop in any muscle, but the most common place is in the calf muscles of the leg.  Leg cramps are painful, and they may last for a few seconds to a few minutes.  Usually, leg cramps are not caused by a serious medical problem. Often, the cause is not known.  Stay hydrated and take over-the-counter and prescription medicines only as told by your health care provider. This information is not intended to replace advice given to you by your health care provider. Make sure you discuss any questions you have with your health care   provider. Document Revised: 01/08/2017 Document Reviewed: 11/05/2016 Elsevier Patient Education  2020 [employer]  

## 2019-12-11 DIAGNOSIS — M79605 Pain in left leg: Secondary | ICD-10-CM | POA: Diagnosis not present

## 2019-12-19 ENCOUNTER — Other Ambulatory Visit: Payer: Self-pay

## 2019-12-19 ENCOUNTER — Ambulatory Visit (INDEPENDENT_AMBULATORY_CARE_PROVIDER_SITE_OTHER): Payer: Medicare HMO

## 2019-12-19 DIAGNOSIS — E538 Deficiency of other specified B group vitamins: Secondary | ICD-10-CM | POA: Diagnosis not present

## 2019-12-19 MED ORDER — CYANOCOBALAMIN 1000 MCG/ML IJ SOLN
1000.0000 ug | INTRAMUSCULAR | Status: AC
  Administered 2020-02-06 – 2020-06-11 (×5): 1000 ug via INTRAMUSCULAR

## 2019-12-19 MED ORDER — CYANOCOBALAMIN 1000 MCG/ML IJ SOLN
1000.0000 ug | INTRAMUSCULAR | Status: AC
  Administered 2019-12-19 – 2020-01-02 (×2): 1000 ug via INTRAMUSCULAR

## 2019-12-19 NOTE — Progress Notes (Signed)
Per MMM patient is to get once every 2 weeks for 4 weeks and then get monthly injections. Orders placed - covering PCP  B12 given and tolerated well

## 2019-12-21 ENCOUNTER — Telehealth: Payer: Self-pay

## 2019-12-21 NOTE — Telephone Encounter (Signed)
Attempted to call pt to advise of appt change. Not read in MyChart.

## 2020-01-02 ENCOUNTER — Other Ambulatory Visit: Payer: Self-pay

## 2020-01-02 ENCOUNTER — Ambulatory Visit (INDEPENDENT_AMBULATORY_CARE_PROVIDER_SITE_OTHER): Payer: Medicare HMO | Admitting: *Deleted

## 2020-01-02 DIAGNOSIS — E538 Deficiency of other specified B group vitamins: Secondary | ICD-10-CM

## 2020-01-11 ENCOUNTER — Other Ambulatory Visit: Payer: Self-pay | Admitting: *Deleted

## 2020-01-11 DIAGNOSIS — E559 Vitamin D deficiency, unspecified: Secondary | ICD-10-CM

## 2020-01-22 ENCOUNTER — Other Ambulatory Visit: Payer: Self-pay

## 2020-01-22 ENCOUNTER — Encounter: Payer: Self-pay | Admitting: Urology

## 2020-01-22 ENCOUNTER — Ambulatory Visit (INDEPENDENT_AMBULATORY_CARE_PROVIDER_SITE_OTHER): Payer: Medicare HMO | Admitting: Urology

## 2020-01-22 VITALS — BP 149/111 | HR 92 | Temp 97.8°F | Wt 198.0 lb

## 2020-01-22 DIAGNOSIS — N3941 Urge incontinence: Secondary | ICD-10-CM

## 2020-01-22 DIAGNOSIS — N3281 Overactive bladder: Secondary | ICD-10-CM | POA: Diagnosis not present

## 2020-01-22 LAB — MICROSCOPIC EXAMINATION: Renal Epithel, UA: NONE SEEN /hpf

## 2020-01-22 LAB — URINALYSIS, ROUTINE W REFLEX MICROSCOPIC
Bilirubin, UA: NEGATIVE
Glucose, UA: NEGATIVE
Nitrite, UA: POSITIVE — AB
Specific Gravity, UA: 1.02 (ref 1.005–1.030)
Urobilinogen, Ur: 1 mg/dL (ref 0.2–1.0)
pH, UA: 6.5 (ref 5.0–7.5)

## 2020-01-22 MED ORDER — MIRABEGRON ER 25 MG PO TB24: 25.0000 mg | ORAL_TABLET | Freq: Every day | ORAL | 0 refills | Status: DC

## 2020-01-22 MED ORDER — TRIMETHOPRIM 100 MG PO TABS: 100.0000 mg | ORAL_TABLET | Freq: Every day | ORAL | 3 refills | Status: DC

## 2020-01-22 MED ORDER — CEFUROXIME AXETIL 250 MG PO TABS: 250.0000 mg | ORAL_TABLET | Freq: Two times a day (BID) | ORAL | 0 refills | Status: DC

## 2020-01-22 NOTE — Progress Notes (Signed)
01/22/2020 11:45 AM   Janet Gardner April 17, 1955 [id number]  Referring provider: Gwenlyn Fudge, FNP [address] [place],  [place] [address]  followup recurrent UTI  HPI: Janet Gardner is a 1UU here for followup for recurrent UTI. She was started on macrobid 50mg  which has failed to prevent her UTIs. She currently feels she has a UTI and UA is concerning for infection. She has associated urinary urgency, frequency, dysuria and feeling of incomplete emptying. She has significant urge urinary incontinence and uses 4-5 pads per day which are wet to soaked. No prior OAb therapy   PMH: Past Medical History:  Diagnosis Date  . B12 deficiency    monthly injection  . Fibromyalgia   . GERD (gastroesophageal reflux disease)   . Hypothyroidism     Surgical History: Past Surgical History:  Procedure Laterality Date  . ABDOMINAL HYSTERECTOMY    . CHOLECYSTECTOMY    . colonoscopy  2007   Dr. 2008, incomplete due to poor prep. scope passed to transverse colon. ACBE normal.  . ESOPHAGOGASTRODUODENOSCOPY  07/22/2009   07/24/2009 apperaring esophagus s/p 56 dilator/small HH/large ulcerated gastric polyp in the prepyloric antral area. Inflammatory fibroid polyp.  . ESOPHAGOGASTRODUODENOSCOPY (EGD) WITH ESOPHAGEAL DILATION N/A 11/24/2012   Procedure: ESOPHAGOGASTRODUODENOSCOPY (EGD) WITH ESOPHAGEAL DILATION;  Surgeon: 11/26/2012, MD;  [facility]: [facility];  Service: Endoscopy;  Laterality: N/A;  9:30AM  . FOOT SURGERY     Right  . KIDNEY SURGERY      Home Medications:  Allergies as of 01/22/2020      Reactions   Levaquin [levofloxacin] Nausea And Vomiting   Sulfa Antibiotics Hives   Vomiting      Medication List       Accurate as of January 22, 2020 11:45 AM. If you have any questions, ask your nurse or doctor.        STOP taking these medications   nitrofurantoin 50 MG capsule Commonly known as: MACRODANTIN Stopped by: January 24, 2020, MD     TAKE these medications    cefUROXime 250 MG tablet Commonly known as: CEFTIN Take 1 tablet (250 mg total) by mouth 2 (two) times daily with a meal. Started by: Wilkie Aye, MD   gabapentin 300 MG capsule Commonly known as: NEURONTIN Take 1 capsule (300 mg total) by mouth at bedtime.   ibuprofen 600 MG tablet Commonly known as: ADVIL Take 1 tablet (600 mg total) by mouth every 8 (eight) hours as needed.   levothyroxine 125 MCG tablet Commonly known as: Synthroid Take 1 tablet (125 mcg total) by mouth daily before breakfast.   mupirocin ointment 2 % Commonly known as: BACTROBAN Apply 1 application topically as needed.   predniSONE 10 MG (21) Tbpk tablet Commonly known as: STERAPRED UNI-PAK 21 TAB Take 6  Tablet by mouth day 1, 5 tablet day 2, 4 tab day 3, 3 tablet day 4, 2 tablet day 5, 1 tablet day 6   trimethoprim 100 MG tablet Commonly known as: TRIMPEX Take 1 tablet (100 mg total) by mouth daily. Started by: Wilkie Aye, MD       Allergies:  Allergies  Allergen Reactions  . Levaquin [Levofloxacin] Nausea And Vomiting  . Sulfa Antibiotics Hives    Vomiting    Family History: Family History  Problem Relation Age of Onset  . Breast cancer Mother   . Heart disease Father   . Colon cancer Neg Hx     Social History:  reports that she has never  smoked. She has never used smokeless tobacco. She reports that she does not drink alcohol and does not use drugs.  ROS: All other review of systems were reviewed and are negative except what is noted above in HPI  Physical Exam: BP (!) 149/111   Pulse 92   Temp 97.8 F (36.6 C)   Wt 198 lb (89.8 kg)   BMI 29.24 kg/m   Constitutional:  Alert and oriented, No acute distress. HEENT:  AT, moist mucus membranes.  Trachea midline, no masses. Cardiovascular: No clubbing, cyanosis, or edema. Respiratory: Normal respiratory effort, no increased work of breathing. GI: Abdomen is soft, nontender, nondistended, no abdominal masses GU: No  CVA tenderness.  Lymph: No cervical or inguinal lymphadenopathy. Skin: No rashes, bruises or suspicious lesions. Neurologic: Grossly intact, no focal deficits, moving all 4 extremities. Psychiatric: Normal mood and affect.  Laboratory Data: Lab Results  Component Value Date   WBC 4.5 05/10/2019   HGB 13.2 05/10/2019   HCT 41.5 05/10/2019   MCV 84 05/10/2019   PLT 235 05/10/2019    Lab Results  Component Value Date   CREATININE 0.68 10/27/2019    No results found for: PSA  No results found for: TESTOSTERONE  No results found for: HGBA1C  Urinalysis    Component Value Date/Time   COLORURINE YELLOW 02/10/2011 0953   APPEARANCEUR Clear 10/17/2019 1559   LABSPEC 1.010 02/10/2011 0953   PHURINE 8.0 02/10/2011 0953   GLUCOSEU Negative 10/17/2019 1559   HGBUR NEGATIVE 02/10/2011 0953   BILIRUBINUR Negative 10/17/2019 1559   KETONESUR NEGATIVE 02/10/2011 0953   PROTEINUR Negative 10/17/2019 1559   PROTEINUR NEGATIVE 02/10/2011 0953   UROBILINOGEN 1.0 02/10/2011 0953   NITRITE Negative 10/17/2019 1559   NITRITE NEGATIVE 02/10/2011 0953   LEUKOCYTESUR 1+ (A) 10/17/2019 1559    Lab Results  Component Value Date   LABMICR See below: 10/17/2019   WBCUA 11-30 (A) 10/17/2019   LABEPIT 0-10 10/17/2019   MUCUS Present 03/21/2019   BACTERIA Few 10/17/2019    Pertinent Imaging:  No results found for this or any previous visit.  No results found for this or any previous visit.  No results found for this or any previous visit.  No results found for this or any previous visit.  No results found for this or any previous visit.  No results found for this or any previous visit.  Results for orders placed during the hospital encounter of 10/10/19  CT HEMATURIA WORKUP  Narrative CLINICAL DATA:  Recurrent urinary tract infections.  EXAM: CT ABDOMEN AND PELVIS WITHOUT AND WITH CONTRAST  TECHNIQUE: Multidetector CT imaging of the abdomen and pelvis was  performed following the standard protocol before and following the bolus administration of intravenous contrast.  CONTRAST:  OMNIPAQUE IOHEXOL 300 MG/ML  SOLN  COMPARISON:  02/10/2011  FINDINGS: Lower Chest: No acute findings.  Hepatobiliary: No hepatic masses identified. Prior cholecystectomy. No evidence of biliary obstruction.  Pancreas:  No mass or inflammatory changes.  Spleen: Within normal limits in size and appearance.  Adrenals/Urinary Tract: No adrenal masses identified. No evidence of urolithiasis or hydronephrosis. No renal masses identified. No masses seen involving the collecting systems, ureters, or bladder.  Stomach/Bowel: No evidence of obstruction, inflammatory process or abnormal fluid collections.  Vascular/Lymphatic: No pathologically enlarged lymph nodes. No abdominal aortic aneurysm. Aortic atherosclerosis noted.  Reproductive: Prior hysterectomy noted. Adnexal regions are unremarkable in appearance.  Other:  None.  Musculoskeletal:  No suspicious bone lesions identified.  IMPRESSION: No radiographic  evidence of urinary tract neoplasm, urolithiasis, or hydronephrosis. No other significant abnormality identified.  Aortic Atherosclerosis (ICD10-I70.0).   Electronically Signed By: Danae Orleans M.D. On: 10/11/2019 09:45  No results found for this or any previous visit.   Assessment & Plan:    1. Urge incontinence of urine -mirabegron 25mg  daily - Urinalysis, Routine w reflex microscopic - Urine culture  2. Recurrent UTI -urine for culture. ceftin 250mg  BID for 7 days. We will start trimethoprim prophylaxis   Return in about 3 months (around 04/21/2020).  , MD  [facility] [place]

## 2020-01-22 NOTE — Patient Instructions (Signed)
Urinary Tract Infection, Adult A urinary tract infection (UTI) is an infection of any part of the urinary tract. The urinary tract includes:  The kidneys.  The ureters.  The bladder.  The urethra. These organs make, store, and get rid of pee (urine) in the body. What are the causes? This is caused by germs (bacteria) in your genital area. These germs grow and cause swelling (inflammation) of your urinary tract. What increases the risk? You are more likely to develop this condition if:  You have a small, thin tube (catheter) to drain pee.  You cannot control when you pee or poop (incontinence).  You are female, and: ? You use these methods to prevent pregnancy:  A medicine that kills sperm (spermicide).  A device that blocks sperm (diaphragm). ? You have low levels of a female hormone (estrogen). ? You are pregnant.  You have genes that add to your risk.  You are sexually active.  You take antibiotic medicines.  You have trouble peeing because of: ? A prostate that is bigger than normal, if you are female. ? A blockage in the part of your body that drains pee from the bladder (urethra). ? A kidney stone. ? A nerve condition that affects your bladder (neurogenic bladder). ? Not getting enough to drink. ? Not peeing often enough.  You have other conditions, such as: ? Diabetes. ? A weak disease-fighting system (immune system). ? Sickle cell disease. ? Gout. ? Injury of the spine. What are the signs or symptoms? Symptoms of this condition include:  Needing to pee right away (urgently).  Peeing often.  Peeing small amounts often.  Pain or burning when peeing.  Blood in the pee.  Pee that smells bad or not like normal.  Trouble peeing.  Pee that is cloudy.  Fluid coming from the vagina, if you are female.  Pain in the belly or lower back. Other symptoms include:  Throwing up (vomiting).  No urge to eat.  Feeling mixed up (confused).  Being tired  and grouchy (irritable).  A fever.  Watery poop (diarrhea). How is this treated? This condition may be treated with:  Antibiotic medicine.  Other medicines.  Drinking enough water. Follow these instructions at home:  Medicines  Take over-the-counter and prescription medicines only as told by your doctor.  If you were prescribed an antibiotic medicine, take it as told by your doctor. Do not stop taking it even if you start to feel better. General instructions  Make sure you: ? Pee until your bladder is empty. ? Do not hold pee for a long time. ? Empty your bladder after sex. ? Wipe from front to back after pooping if you are a female. Use each tissue one time when you wipe.  Drink enough fluid to keep your pee pale yellow.  Keep all follow-up visits as told by your doctor. This is important. Contact a doctor if:  You do not get better after 1-2 days.  Your symptoms go away and then come back. Get help right away if:  You have very bad back pain.  You have very bad pain in your lower belly.  You have a fever.  You are sick to your stomach (nauseous).  You are throwing up. Summary  A urinary tract infection (UTI) is an infection of any part of the urinary tract.  This condition is caused by germs in your genital area.  There are many risk factors for a UTI. These include having a small, thin   tube to drain pee and not being able to control when you pee or poop.  Treatment includes antibiotic medicines for germs.  Drink enough fluid to keep your pee pale yellow. This information is not intended to replace advice given to you by your health care provider. Make sure you discuss any questions you have with your health care provider. Document Revised: 01/13/2018 Document Reviewed: 08/05/2017 Elsevier Patient Education  2020 [employer]  

## 2020-01-22 NOTE — Progress Notes (Signed)
Urological Symptom Review  Patient is experiencing the following symptoms: Frequent urination Get up at night to urinate Leakage of urine Urinary tract infection   Review of Systems  Gastrointestinal (upper)  : Negative for upper GI symptoms  Gastrointestinal (lower) : Negative for lower GI symptoms  Constitutional : Negative for symptoms  Skin: Negative for skin symptoms  Eyes: Negative for eye symptoms  Ear/Nose/Throat : Negative for Ear/Nose/Throat symptoms  Hematologic/Lymphatic: Negative for Hematologic/Lymphatic symptoms  Cardiovascular : Negative for cardiovascular symptoms  Respiratory : Negative for respiratory symptoms  Endocrine: Negative for endocrine symptoms  Musculoskeletal: Negative for musculoskeletal symptoms  Neurological: Negative for neurological symptoms  Psychologic: Negative for psychiatric symptoms  

## 2020-01-28 LAB — URINE CULTURE

## 2020-01-29 ENCOUNTER — Telehealth: Payer: Self-pay

## 2020-01-29 ENCOUNTER — Other Ambulatory Visit: Payer: Self-pay

## 2020-01-29 DIAGNOSIS — N3021 Other chronic cystitis with hematuria: Secondary | ICD-10-CM

## 2020-01-29 MED ORDER — NITROFURANTOIN MONOHYD MACRO 100 MG PO CAPS: 100.0000 mg | ORAL_CAPSULE | Freq: Two times a day (BID) | ORAL | 0 refills | Status: DC

## 2020-01-29 NOTE — Telephone Encounter (Signed)
rx sent in. Message left for patient to return call.

## 2020-01-29 NOTE — Telephone Encounter (Signed)
-----   Message from Malen Gauze, MD sent at 01/29/2020  8:43 AM EST ----- Macrobid 100mg  BID for 7 days ----- Message ----- From: , RN Sent: 01/29/2020   8:25 AM EST To: 01/31/2020, MD  Please review

## 2020-02-06 ENCOUNTER — Other Ambulatory Visit: Payer: Self-pay

## 2020-02-06 ENCOUNTER — Ambulatory Visit (INDEPENDENT_AMBULATORY_CARE_PROVIDER_SITE_OTHER): Payer: Medicare HMO

## 2020-02-06 DIAGNOSIS — E538 Deficiency of other specified B group vitamins: Secondary | ICD-10-CM | POA: Diagnosis not present

## 2020-02-06 NOTE — Progress Notes (Signed)
Pt tolerated B12 injection well. No concerns today.

## 2020-03-01 ENCOUNTER — Ambulatory Visit: Payer: Medicare HMO | Admitting: Urology

## 2020-03-08 ENCOUNTER — Ambulatory Visit: Payer: Medicare HMO | Admitting: Urology

## 2020-03-08 ENCOUNTER — Ambulatory Visit (INDEPENDENT_AMBULATORY_CARE_PROVIDER_SITE_OTHER): Payer: Medicare HMO

## 2020-03-08 ENCOUNTER — Other Ambulatory Visit: Payer: Self-pay

## 2020-03-08 DIAGNOSIS — E538 Deficiency of other specified B group vitamins: Secondary | ICD-10-CM

## 2020-03-09 ENCOUNTER — Encounter: Payer: Self-pay | Admitting: Nurse Practitioner

## 2020-03-20 ENCOUNTER — Other Ambulatory Visit: Payer: Self-pay

## 2020-03-20 ENCOUNTER — Encounter: Payer: Self-pay | Admitting: Family Medicine

## 2020-03-20 ENCOUNTER — Ambulatory Visit (INDEPENDENT_AMBULATORY_CARE_PROVIDER_SITE_OTHER): Payer: Medicare HMO

## 2020-03-20 ENCOUNTER — Ambulatory Visit (INDEPENDENT_AMBULATORY_CARE_PROVIDER_SITE_OTHER): Payer: Medicare HMO | Admitting: Family Medicine

## 2020-03-20 VITALS — BP 148/81 | HR 71 | Temp 98.2°F | Ht 69.0 in | Wt 210.8 lb

## 2020-03-20 DIAGNOSIS — M25562 Pain in left knee: Secondary | ICD-10-CM | POA: Diagnosis not present

## 2020-03-20 DIAGNOSIS — M79671 Pain in right foot: Secondary | ICD-10-CM | POA: Diagnosis not present

## 2020-03-20 DIAGNOSIS — W19XXXA Unspecified fall, initial encounter: Secondary | ICD-10-CM

## 2020-03-20 NOTE — Patient Instructions (Signed)

## 2020-03-20 NOTE — Progress Notes (Signed)
Established Patient Office Visit  Subjective:  Patient ID: Janet Gardner, female    DOB: [birth year]  Age: 65 y.o. MRN: [id number]  CC:  Chief Complaint  Patient presents with  . Fall    X 2 days ago Left knee pain  Right foot pain    HPI Janet Gardner presents for for a fall 2 days ago. She tripped and landed down on both knees as well as her right foot. She reports pain in the top of her right foot that feels like a constant throbbing. Her foot has been swollen. The pain is worse with weight bearing. The pain is moderate. She has been taking advil with some relief. She also reports left knee pain. The pain is along the lower portion of her knee. The pain is achy and mild. She denies swelling. If she needs to see an orthopedic, she would prefer to see Dr. Romeo Apple in [place].   Past Medical History:  Diagnosis Date  . B12 deficiency    monthly injection  . Fibromyalgia   . GERD (gastroesophageal reflux disease)   . Hypothyroidism     Past Surgical History:  Procedure Laterality Date  . ABDOMINAL HYSTERECTOMY    . CHOLECYSTECTOMY    . colonoscopy  2007   Dr. Laurell Josephs, incomplete due to poor prep. scope passed to transverse colon. ACBE normal.  . ESOPHAGOGASTRODUODENOSCOPY  07/22/2009   GBT:DVVOHY apperaring esophagus s/p 56 dilator/small HH/large ulcerated gastric polyp in the prepyloric antral area. Inflammatory fibroid polyp.  . ESOPHAGOGASTRODUODENOSCOPY (EGD) WITH ESOPHAGEAL DILATION N/A 11/24/2012   Procedure: ESOPHAGOGASTRODUODENOSCOPY (EGD) WITH ESOPHAGEAL DILATION;  Surgeon: Corbin Ade, MD;  Location: [facility];  Service: Endoscopy;  Laterality: N/A;  9:30AM  . FOOT SURGERY     Right  . KIDNEY SURGERY      Family History  Problem Relation Age of Onset  . Breast cancer Mother   . Heart disease Father   . Colon cancer Neg Hx     Social History   Socioeconomic History  . Marital status: Married    Spouse name: Not on file  . Number of children: 1  .  Years of education: Not on file  . Highest education level: Not on file  Occupational History  . Not on file  Tobacco Use  . Smoking status: Never Smoker  . Smokeless tobacco: Never Used  Vaping Use  . Vaping Use: Never used  Substance and Sexual Activity  . Alcohol use: No  . Drug use: No  . Sexual activity: Not on file  Other Topics Concern  . Not on file  Social History Narrative  . Not on file   Social Determinants of Health   Financial Resource Strain: Not on file  Food Insecurity: Not on file  Transportation Needs: Not on file  Physical Activity: Not on file  Stress: Not on file  Social Connections: Not on file  Intimate Partner Violence: Not on file    Outpatient Medications Prior to Visit  Medication Sig Dispense Refill  . gabapentin (NEURONTIN) 300 MG capsule Take 1 capsule (300 mg total) by mouth at bedtime. 30 capsule 2  . ibuprofen (ADVIL) 600 MG tablet Take 1 tablet (600 mg total) by mouth every 8 (eight) hours as needed. 30 tablet 0  . levothyroxine (SYNTHROID) 125 MCG tablet Take 1 tablet (125 mcg total) by mouth daily before breakfast. 90 tablet 3  . mirabegron ER (MYRBETRIQ) 25 MG TB24 tablet Take 1 tablet (25 mg  total) by mouth daily. 30 tablet 0  . trimethoprim (TRIMPEX) 100 MG tablet Take 1 tablet (100 mg total) by mouth daily. 90 tablet 3  . cefUROXime (CEFTIN) 250 MG tablet Take 1 tablet (250 mg total) by mouth 2 (two) times daily with a meal. 14 tablet 0  . mupirocin ointment (BACTROBAN) 2 % Apply 1 application topically as needed.    . nitrofurantoin, macrocrystal-monohydrate, (MACROBID) 100 MG capsule Take 1 capsule (100 mg total) by mouth every 12 (twelve) hours. 14 capsule 0  . predniSONE (STERAPRED UNI-PAK 21 TAB) 10 MG (21) TBPK tablet Take 6  Tablet by mouth day 1, 5 tablet day 2, 4 tab day 3, 3 tablet day 4, 2 tablet day 5, 1 tablet day 6 1 each 0   Facility-Administered Medications Prior to Visit  Medication Dose Route Frequency Provider Last  Rate Last Admin  . cyanocobalamin ((VITAMIN B-12)) injection 1,000 mcg  1,000 mcg Intramuscular Q30 days Bennie Pierini, FNP   1,000 mcg at 03/08/20 1015    Allergies  Allergen Reactions  . Levaquin [Levofloxacin] Nausea And Vomiting  . Sulfa Antibiotics Hives    Vomiting    ROS Review of Systems As per HPI.    Objective:    Physical Exam Vitals and nursing note reviewed.  Constitutional:      Appearance: Normal appearance.  HENT:     Head: Normocephalic and atraumatic.  Pulmonary:     Effort: Pulmonary effort is normal. No respiratory distress.  Musculoskeletal:     Left knee: No swelling, effusion, erythema or bony tenderness. Normal range of motion. No tenderness. Normal alignment and normal patellar mobility.     Right foot: Normal range of motion and normal capillary refill. Swelling (Mild diffuse swelling) present. No deformity. Normal pulse.       Feet:  Skin:    General: Skin is warm and dry.     Capillary Refill: Capillary refill takes less than 2 seconds.  Neurological:     General: No focal deficit present.     Mental Status: She is alert and oriented to person, place, and time.  Psychiatric:        Mood and Affect: Mood normal.        Behavior: Behavior normal.     BP (!) 148/81   Pulse 71   Temp 98.2 F (36.8 C)   Ht 5\' 9"  (1.753 m)   Wt 210 lb 12.8 oz (95.6 kg)   SpO2 100%   BMI 31.13 kg/m  Wt Readings from Last 3 Encounters:  03/20/20 210 lb 12.8 oz (95.6 kg)  01/22/20 198 lb (89.8 kg)  11/28/19 210 lb (95.3 kg)     There are no preventive care reminders to display for this patient.  There are no preventive care reminders to display for this patient.  Lab Results  Component Value Date   TSH 0.776 10/27/2019   Lab Results  Component Value Date   WBC 4.5 05/10/2019   HGB 13.2 05/10/2019   HCT 41.5 05/10/2019   MCV 84 05/10/2019   PLT 235 05/10/2019   Lab Results  Component Value Date   NA 138 10/27/2019   K 4.2  10/27/2019   CO2 23 10/27/2019   GLUCOSE 100 (H) 10/27/2019   BUN 9 10/27/2019   CREATININE 0.68 10/27/2019   BILITOT 0.3 10/27/2019   ALKPHOS 85 10/27/2019   AST 14 10/27/2019   ALT 13 10/27/2019   PROT 7.0 10/27/2019   ALBUMIN 4.2 10/27/2019  CALCIUM 8.8 10/27/2019   Lab Results  Component Value Date   CHOL 187 10/27/2019   Lab Results  Component Value Date   HDL 48 10/27/2019   Lab Results  Component Value Date   LDLCALC 124 (H) 10/27/2019   Lab Results  Component Value Date   TRIG 80 10/27/2019   Lab Results  Component Value Date   CHOLHDL 3.9 10/27/2019   No results found for: HGBA1C    Assessment & Plan:   Gabrial was seen today for fall.  Diagnoses and all orders for this visit:  Fall, initial encounter Discussed safety measures. Denies head injury or LOC.   Right foot pain Tenderness to palpation on doral side. Xray today if office, radiology report pending. Rest, compression, elevation, heat, ice, advil.  -     DG Foot Complete Right  Acute pain of left knee Exam unremarkable today. Rest, elevation, heat, ice, advil.   Follow-up: Return if symptoms worsen or fail to improve.   The patient indicates understanding of these issues and agrees with the plan.  Gabriel Earing, FNP

## 2020-03-28 DIAGNOSIS — S92345A Nondisplaced fracture of fourth metatarsal bone, left foot, initial encounter for closed fracture: Secondary | ICD-10-CM | POA: Diagnosis not present

## 2020-04-09 ENCOUNTER — Other Ambulatory Visit: Payer: Self-pay

## 2020-04-09 ENCOUNTER — Ambulatory Visit (INDEPENDENT_AMBULATORY_CARE_PROVIDER_SITE_OTHER): Payer: Medicare HMO | Admitting: *Deleted

## 2020-04-09 DIAGNOSIS — E538 Deficiency of other specified B group vitamins: Secondary | ICD-10-CM | POA: Diagnosis not present

## 2020-04-23 DIAGNOSIS — J0100 Acute maxillary sinusitis, unspecified: Secondary | ICD-10-CM | POA: Diagnosis not present

## 2020-04-26 ENCOUNTER — Encounter: Payer: Self-pay | Admitting: Family Medicine

## 2020-04-26 ENCOUNTER — Ambulatory Visit (INDEPENDENT_AMBULATORY_CARE_PROVIDER_SITE_OTHER): Payer: Medicare HMO | Admitting: Family Medicine

## 2020-04-26 DIAGNOSIS — J0100 Acute maxillary sinusitis, unspecified: Secondary | ICD-10-CM

## 2020-04-26 MED ORDER — ALBUTEROL SULFATE HFA 108 (90 BASE) MCG/ACT IN AERS: 2.0000 | INHALATION_SPRAY | Freq: Four times a day (QID) | RESPIRATORY_TRACT | 1 refills | Status: DC | PRN

## 2020-04-26 NOTE — Progress Notes (Signed)
Virtual Visit via Telephone Note  I connected with Rosebud Poles on 04/26/20 at 2:07 PM by telephone and verified that I am speaking with the correct person using two identifiers. NAO LINZ is currently located at home and her granddaughter is currently with her during this visit. The provider, Gwenlyn Fudge, FNP is located in their home at time of visit.  I discussed the limitations, risks, security and privacy concerns of performing an evaluation and management service by telephone and the availability of in person appointments. I also discussed with the patient that there may be a patient responsible charge related to this service. The patient expressed understanding and agreed to proceed.  Subjective: PCP: Gwenlyn Fudge, FNP  Chief Complaint  Patient presents with  . Cough   Patient was seen at urgent care 3 days ago and diagnosed with sinusitis.  She is currently taking amoxicillin and prednisone as prescribed.  She reports her only concern is that she is having a lot of wheezing and would like an inhaler.   ROS: Per HPI  Current Outpatient Medications:  .  gabapentin (NEURONTIN) 300 MG capsule, Take 1 capsule (300 mg total) by mouth at bedtime., Disp: 30 capsule, Rfl: 2 .  ibuprofen (ADVIL) 600 MG tablet, Take 1 tablet (600 mg total) by mouth every 8 (eight) hours as needed., Disp: 30 tablet, Rfl: 0 .  levothyroxine (SYNTHROID) 125 MCG tablet, Take 1 tablet (125 mcg total) by mouth daily before breakfast., Disp: 90 tablet, Rfl: 3 .  mirabegron ER (MYRBETRIQ) 25 MG TB24 tablet, Take 1 tablet (25 mg total) by mouth daily., Disp: 30 tablet, Rfl: 0 .  trimethoprim (TRIMPEX) 100 MG tablet, Take 1 tablet (100 mg total) by mouth daily., Disp: 90 tablet, Rfl: 3  Current Facility-Administered Medications:  .  cyanocobalamin ((VITAMIN B-12)) injection 1,000 mcg, 1,000 mcg, Intramuscular, Q30 days, Daphine Deutscher, Mary-Margaret, FNP, 1,000 mcg at 04/09/20 0957  Allergies  Allergen  Reactions  . Levaquin [Levofloxacin] Nausea And Vomiting  . Sulfa Antibiotics Hives    Vomiting   Past Medical History:  Diagnosis Date  . B12 deficiency    monthly injection  . Fibromyalgia   . GERD (gastroesophageal reflux disease)   . Hypothyroidism     Observations/Objective: A&O  No respiratory distress or wheezing audible over the phone Mood, judgement, and thought processes all WNL  Assessment and Plan: 1. Acute non-recurrent maxillary sinusitis Continue amoxicillin and prednisone until course is completed.  Discussed proper use of albuterol inhaler. - albuterol (VENTOLIN HFA) 108 (90 Base) MCG/ACT inhaler; Inhale 2 puffs into the lungs every 6 (six) hours as needed for wheezing or shortness of breath.  Dispense: 18 g; Refill: 1   Follow Up Instructions:  I discussed the assessment and treatment plan with the patient. The patient was provided an opportunity to ask questions and all were answered. The patient agreed with the plan and demonstrated an understanding of the instructions.   The patient was advised to call back or seek an in-person evaluation if the symptoms worsen or if the condition fails to improve as anticipated.  The above assessment and management plan was discussed with the patient. The patient verbalized understanding of and has agreed to the management plan. Patient is aware to call the clinic if symptoms persist or worsen. Patient is aware when to return to the clinic for a follow-up visit. Patient educated on when it is appropriate to go to the emergency department.   Time call ended:  2:12 PM  I provided 5 minutes of non-face-to-face time during this encounter.  Deliah Boston, MSN, APRN, FNP-C [facility] 04/26/20

## 2020-05-08 ENCOUNTER — Ambulatory Visit (INDEPENDENT_AMBULATORY_CARE_PROVIDER_SITE_OTHER): Payer: Medicare HMO

## 2020-05-08 ENCOUNTER — Other Ambulatory Visit: Payer: Self-pay

## 2020-05-08 DIAGNOSIS — E538 Deficiency of other specified B group vitamins: Secondary | ICD-10-CM

## 2020-05-16 ENCOUNTER — Other Ambulatory Visit (HOSPITAL_COMMUNITY): Payer: Self-pay | Admitting: Family Medicine

## 2020-05-16 DIAGNOSIS — Z1231 Encounter for screening mammogram for malignant neoplasm of breast: Secondary | ICD-10-CM

## 2020-05-23 ENCOUNTER — Encounter: Payer: Self-pay | Admitting: Family Medicine

## 2020-05-23 DIAGNOSIS — H5200 Hypermetropia, unspecified eye: Secondary | ICD-10-CM | POA: Diagnosis not present

## 2020-05-24 DIAGNOSIS — Z0100 Encounter for examination of eyes and vision without abnormal findings: Secondary | ICD-10-CM | POA: Diagnosis not present

## 2020-06-03 ENCOUNTER — Ambulatory Visit (HOSPITAL_COMMUNITY)
Admission: RE | Admit: 2020-06-03 | Discharge: 2020-06-03 | Disposition: A | Discharge status: Home / Self Care | Payer: Medicare HMO | Source: Ambulatory Visit | Attending: Family Medicine | Admitting: Family Medicine

## 2020-06-03 ENCOUNTER — Other Ambulatory Visit: Payer: Self-pay

## 2020-06-03 DIAGNOSIS — Z1231 Encounter for screening mammogram for malignant neoplasm of breast: Secondary | ICD-10-CM | POA: Diagnosis not present

## 2020-06-04 ENCOUNTER — Ambulatory Visit (INDEPENDENT_AMBULATORY_CARE_PROVIDER_SITE_OTHER): Payer: Medicare HMO

## 2020-06-04 VITALS — Ht 69.0 in | Wt 195.0 lb

## 2020-06-04 DIAGNOSIS — Z0000 Encounter for general adult medical examination without abnormal findings: Secondary | ICD-10-CM

## 2020-06-04 NOTE — Progress Notes (Signed)
Subjective:   Janet Gardner is a 65 y.o. female who presents for Medicare Annual (Subsequent) preventive examination.  Virtual Visit via Telephone Note  I connected with  Rosebud Poles on 06/04/20 at  9:45 AM EDT by telephone and verified that I am speaking with the correct person using two identifiers.  Location: Patient: Home Provider: [facility] Persons participating in the virtual visit: patient/Nurse Health Advisor   I discussed the limitations, risks, security and privacy concerns of performing an evaluation and management service by telephone and the availability of in person appointments. The patient expressed understanding and agreed to proceed.  Interactive audio and video telecommunications were attempted between this nurse and patient, however failed, due to patient having technical difficulties OR patient did not have access to video capability.  We continued and completed visit with audio only.  Some vital signs may be absent or patient reported.   Gaynel Schaafsma E Rudy Domek, LPN   Review of Systems     Cardiac Risk Factors include: advanced age (>81men, >27 women);dyslipidemia;hypertension     Objective:    Today's Vitals   06/04/20 0945  Weight: 195 lb (88.5 kg)  Height: 5\' 9"  (1.753 m)  PainSc: 4    Body mass index is 28.8 kg/m.  Advanced Directives 06/04/2020 06/02/2019 11/24/2012  Does Patient Have a Medical Advance Directive? No No Patient does not have advance directive;Patient would not like information  Would patient like information on creating a medical advance directive? Yes (MAU/Ambulatory/Procedural Areas - Information given) No - Patient declined -    Current Medications (verified) Outpatient Encounter Medications as of 06/04/2020  Medication Sig  . albuterol (VENTOLIN HFA) 108 (90 Base) MCG/ACT inhaler Inhale 2 puffs into the lungs every 6 (six) hours as needed for wheezing or shortness of breath.  . levothyroxine (SYNTHROID) 125 MCG tablet Take 1 tablet (125  mcg total) by mouth daily before breakfast.  . mirabegron ER (MYRBETRIQ) 25 MG TB24 tablet Take 1 tablet (25 mg total) by mouth daily.  . [DISCONTINUED] amoxicillin (AMOXIL) 875 MG tablet Take by mouth.  . gabapentin (NEURONTIN) 300 MG capsule Take 1 capsule (300 mg total) by mouth at bedtime. (Patient not taking: Reported on 06/04/2020)  . ibuprofen (ADVIL) 600 MG tablet Take 1 tablet (600 mg total) by mouth every 8 (eight) hours as needed. (Patient not taking: Reported on 06/04/2020)  . predniSONE (DELTASONE) 5 MG tablet Take 6-5-4-3-2-1 po qd (Patient not taking: Reported on 06/04/2020)  . trimethoprim (TRIMPEX) 100 MG tablet Take 1 tablet (100 mg total) by mouth daily. (Patient not taking: Reported on 06/04/2020)  . [DISCONTINUED] EC-NAPROXEN 500 MG EC tablet  (Patient not taking: Reported on 06/04/2020)   Facility-Administered Encounter Medications as of 06/04/2020  Medication  . cyanocobalamin ((VITAMIN B-12)) injection 1,000 mcg    Allergies (verified) Levaquin [levofloxacin] and Sulfa antibiotics   History: Past Medical History:  Diagnosis Date  . B12 deficiency    monthly injection  . Fibromyalgia   . GERD (gastroesophageal reflux disease)   . Hypothyroidism    Past Surgical History:  Procedure Laterality Date  . ABDOMINAL HYSTERECTOMY    . CHOLECYSTECTOMY    . colonoscopy  2007   Dr. 2008, incomplete due to poor prep. scope passed to transverse colon. ACBE normal.  . ESOPHAGOGASTRODUODENOSCOPY  07/22/2009   07/24/2009 apperaring esophagus s/p 56 dilator/small HH/large ulcerated gastric polyp in the prepyloric antral area. Inflammatory fibroid polyp.  . ESOPHAGOGASTRODUODENOSCOPY (EGD) WITH ESOPHAGEAL DILATION N/A 11/24/2012   Procedure: ESOPHAGOGASTRODUODENOSCOPY (  EGD) WITH ESOPHAGEAL DILATION;  Surgeon: Corbin Ade, MD;  Location: [facility];  Service: Endoscopy;  Laterality: N/A;  9:30AM  . FOOT SURGERY     Right  . KIDNEY SURGERY     Family History  Problem  Relation Age of Onset  . Breast cancer Mother   . Heart disease Father   . Colon cancer Neg Hx    Social History   Socioeconomic History  . Marital status: Married    Spouse name: Not on file  . Number of children: 1  . Years of education: Not on file  . Highest education level: Not on file  Occupational History  . Occupation: retired    Comment: retired  Tobacco Use  . Smoking status: Never Smoker  . Smokeless tobacco: Never Used  Vaping Use  . Vaping Use: Never used  Substance and Sexual Activity  . Alcohol use: No  . Drug use: No  . Sexual activity: Not on file  Other Topics Concern  . Not on file  Social History Narrative   Lives with her husband and her 84 year old granddaughter. Her daughter lives in [place]   Social Determinants of Health   Financial Resource Strain: Low Risk   . Difficulty of Paying Living Expenses: Not hard at all  Food Insecurity: No Food Insecurity  . Worried About Programme researcher, broadcasting/film/video in the Last Year: Never true  . Ran Out of Food in the Last Year: Never true  Transportation Needs: No Transportation Needs  . Lack of Transportation (Medical): No  . Lack of Transportation (Non-Medical): No  Physical Activity: Sufficiently Active  . Days of Exercise per Week: 7 days  . Minutes of Exercise per Session: 30 min  Stress: No Stress Concern Present  . Feeling of Stress : Not at all  Social Connections: Socially Integrated  . Frequency of Communication with Friends and Family: More than three times a week  . Frequency of Social Gatherings with Friends and Family: Once a week  . Attends Religious Services: More than 4 times per year  . Active Member of Clubs or Organizations: Yes  . Attends Banker Meetings: More than 4 times per year  . Marital Status: Married    Tobacco Counseling Counseling given: Not Answered   Clinical Intake:  Pre-visit preparation completed: Yes  Pain : 0-10 Pain Score: 4  Pain Type: Chronic  pain Pain Location: Generalized Pain Descriptors / Indicators: Aching Pain Onset: More than a month ago Pain Frequency: Intermittent     BMI - recorded: 28.8 Nutritional Status: BMI 25 -29 Overweight Nutritional Risks: None Diabetes: No  How often do you need to have someone help you when you read instructions, pamphlets, or other written materials from your doctor or pharmacy?: 1 - Never  Diabetic? No  Interpreter Needed?: No  Information entered by :: Nevena Rozenberg, LPN   Activities of Daily Living In your present state of health, do you have any difficulty performing the following activities: 06/04/2020  Hearing? N  Vision? N  Difficulty concentrating or making decisions? N  Walking or climbing stairs? N  Dressing or bathing? N  Doing errands, shopping? N  Preparing Food and eating ? N  Using the Toilet? N  In the past six months, have you accidently leaked urine? Y  Comment taking Myrbetriq - seeing Urology - wears pads for protection  Do you have problems with loss of bowel control? N  Managing your Medications? N  Managing your Finances? N  Housekeeping or managing your Housekeeping? N  Some recent data might be hidden    Patient Care Team: Gwenlyn FudgeJoyce, Britney F, FNP as PCP - General (Family Medicine) Jena Gaussourk, Gerrit Friendsobert M, MD as Consulting Physician (Gastroenterology)  Indicate any recent Medical Services you may have received from other than [facility] providers in the past year (date may be approximate).     Assessment:   This is a routine wellness examination for Lawson FiscalLori.  Hearing/Vision screen  Hearing Screening   125Hz  250Hz  500Hz  1000Hz  2000Hz  3000Hz  4000Hz  6000Hz  8000Hz   Right ear:           Left ear:           Comments: Declines hearing difficulties  Vision Screening Comments: Wears eyeglasses - [place] 2 [place] - up to date with eye exam.  Dietary issues and exercise activities discussed: Current Exercise Habits: Home exercise routine,  Type of exercise: walking, Time (Minutes): 30, Frequency (Times/Week): 7, Weekly Exercise (Minutes/Week): 210, Intensity: Mild, Exercise limited by: None identified  Goals    . Exercise 150 min/wk Moderate Activity    . Patient Stated     Referral to Urology for urinary incontinence      Depression Screen PHQ 2/9 Scores 06/04/2020 03/20/2020 11/28/2019 10/27/2019 07/26/2019 06/22/2019 06/02/2019  PHQ - 2 Score 0 0 0 0 0 0 0  PHQ- 9 Score - - - - - - -    Fall Risk Fall Risk  06/04/2020 03/20/2020 11/28/2019 10/27/2019 07/26/2019  Falls in the past year? 1 1 0 1 0  Number falls in past yr: 1 1 - 0 -  Injury with Fall? 1 1 - 0 -  Risk for fall due to : Impaired balance/gait;Impaired vision History of fall(s) - - -  Follow up Falls prevention discussed;Education provided Falls evaluation completed - Falls prevention discussed -    FALL RISK PREVENTION PERTAINING TO THE HOME:  Any stairs in or around the home? Yes  If so, are there any without handrails? No  Home free of loose throw rugs in walkways, pet beds, electrical cords, etc? Yes  Adequate lighting in your home to reduce risk of falls? Yes   ASSISTIVE DEVICES UTILIZED TO PREVENT FALLS:  Life alert? No  Use of a cane, walker or w/c? No  Grab bars in the bathroom? No  Shower chair or bench in shower? No  Elevated toilet seat or a handicapped toilet? No   TIMED UP AND GO:  Was the test performed? No .  Telephonic visit.  Cognitive Function:Normal cognitive status assessed by direct observation by this Nurse Health Advisor. No abnormalities found.      6CIT Screen 06/02/2019  What Year? 0 points  What month? 0 points  What time? 0 points  Count back from 20 0 points  Months in reverse 0 points  Repeat phrase 0 points  Total Score 0    Immunizations Immunization History  Administered Date(s) Administered  . Fluad Quad(high Dose 65+) 10/13/2018, 10/27/2019  . Influenza,inj,Quad PF,6+ Mos 11/04/2016, 11/09/2017  .  Influenza-Unspecified 11/23/2013, 11/20/2015, 11/04/2016, 11/09/2017, 10/13/2018, 10/27/2019  . Moderna Sars-Covid-2 Vaccination 04/06/2019, 05/05/2019, 03/05/2020  . Pneumococcal Conjugate-13 09/02/2018  . Tdap 07/15/2015    TDAP status: Up to date  Flu Vaccine status: Up to date  Pneumococcal vaccine status: Due, Education has been provided regarding the importance of this vaccine. Advised may receive this vaccine at local pharmacy or Health Dept. Aware to provide a  copy of the vaccination record if obtained from local pharmacy or Health Dept. Verbalized acceptance and understanding.  Covid-19 vaccine status: Completed vaccines  Qualifies for Shingles Vaccine? Yes   Zostavax completed No   Shingrix Completed?: No.    Education has been provided regarding the importance of this vaccine. Patient has been advised to call insurance company to determine out of pocket expense if they have not yet received this vaccine. Advised may also receive vaccine at local pharmacy or Health Dept. Verbalized acceptance and understanding.  Screening Tests Health Maintenance  Topic Date Due  . COLONOSCOPY (Pts 45-60yrs Insurance coverage will need to be confirmed)  10/21/2015  . DEXA SCAN  02/01/2020  . PNA vac Low Risk Adult (2 of 2 - PPSV23) 10/26/2020 (Originally 09/02/2019)  . INFLUENZA VACCINE  09/09/2020  . MAMMOGRAM  06/03/2021  . TETANUS/TDAP  07/14/2025  . COVID-19 Vaccine  Completed  . Hepatitis C Screening  Completed  . HPV VACCINES  Aged Out    Health Maintenance  Health Maintenance Due  Topic Date Due  . COLONOSCOPY (Pts 45-3yrs Insurance coverage will need to be confirmed)  10/21/2015  . DEXA SCAN  02/01/2020    Colorectal cancer screening: Type of screening: Colonoscopy. Completed 10/20/2005. Repeat every 10 years Hesitant to do colonoscopy, but may be willing to do Cologuard - will discuss with Deliah Boston at next visit.  Mammogram status: Completed 06/03/2020. Repeat every  year  Bone Density status: Completed 01/31/2018. Results reflect: Bone density results: OSTEOPENIA. Repeat every 2 years.  Lung Cancer Screening: (Low Dose CT Chest recommended if Age 30-80 years, 30 pack-year currently smoking OR have quit w/in 15years.) does not qualify.   Additional Screening:  Hepatitis C Screening: does qualify; Completed 09/11/2016  Vision Screening: Recommended annual ophthalmology exams for early detection of glaucoma and other disorders of the eye. Is the patient up to date with their annual eye exam?  Yes  Who is the provider or what is the name of the office in which the patient attends annual eye exams? Laural Benes If pt is not established with a provider, would they like to be referred to a provider to establish care? No .   Dental Screening: Recommended annual dental exams for proper oral hygiene  Community Resource Referral / Chronic Care Management: CRR required this visit?  No   CCM required this visit?  No      Plan:     I have personally reviewed and noted the following in the patient's chart:   . Medical and social history . Use of alcohol, tobacco or illicit drugs  . Current medications and supplements . Functional ability and status . Nutritional status . Physical activity . Advanced directives . List of other physicians . Hospitalizations, surgeries, and ER visits in previous 12 months . Vitals . Screenings to include cognitive, depression, and falls . Referrals and appointments  In addition, I have reviewed and discussed with patient certain preventive protocols, quality metrics, and best practice recommendations. A written personalized care plan for preventive services as well as general preventive health recommendations were provided to patient.     [place] Constable, LPN   1/61/0960   Nurse Notes: Due for DEXA at next visit and may want cologuard ordered

## 2020-06-04 NOTE — Patient Instructions (Signed)
Janet Gardner , Thank you for taking time to come for your Medicare Wellness Visit. I appreciate your ongoing commitment to your health goals. Please review the following plan we discussed and let me know if I can assist you in the future.   Screening recommendations/referrals: Colonoscopy: Done 10/20/2005 - DUE for repeat or cologuard Mammogram: Done 06/03/20 - Repeat every year Bone Density: Done 01/31/2018 - Repeat every 2 years (DUE at next visit) Recommended yearly ophthalmology/optometry visit for glaucoma screening and checkup Recommended yearly dental visit for hygiene and checkup  Vaccinations: Influenza vaccine: Done 10/27/2019 - Repeat every fall Pneumococcal vaccine: Prevnar-13 Done 09/02/2018; DUE for Pnuemovax-23  Tdap vaccine: Done 07/15/2015 - Repeat every 10 years Shingles vaccine: DUE Shingrix discussed. Please contact your pharmacy for coverage information.    Covid-19: Done 04/06/2019, 05/05/2019, & 03/05/2020  Advanced directives: Advance directive discussed with you today. I have provided a copy for you to complete at home and have notarized. Once this is complete please bring a copy in to our office so we can scan it into your chart.  Conditions/risks identified: Aim for 30 minutes of exercise or brisk walking each day, drink 6-8 glasses of water and eat lots of fruits and vegetables.  Next appointment: Follow up in one year for your annual wellness visit    Preventive Care 65 Years and Older, Female Preventive care refers to lifestyle choices and visits with your health care provider that can promote health and wellness. What does preventive care include?  A yearly physical exam. This is also called an annual well check.  Dental exams once or twice a year.  Routine eye exams. Ask your health care provider how often you should have your eyes checked.  Personal lifestyle choices, including:  Daily care of your teeth and gums.  Regular physical activity.  Eating a  healthy diet.  Avoiding tobacco and drug use.  Limiting alcohol use.  Practicing safe sex.  Taking low-dose aspirin every day.  Taking vitamin and mineral supplements as recommended by your health care provider. What happens during an annual well check? The services and screenings done by your health care provider during your annual well check will depend on your age, overall health, lifestyle risk factors, and family history of disease. Counseling  Your health care provider may ask you questions about your:  Alcohol use.  Tobacco use.  Drug use.  Emotional well-being.  Home and relationship well-being.  Sexual activity.  Eating habits.  History of falls.  Memory and ability to understand (cognition).  Work and work Astronomer.  Reproductive health. Screening  You may have the following tests or measurements:  Height, weight, and BMI.  Blood pressure.  Lipid and cholesterol levels. These may be checked every 5 years, or more frequently if you are over 74 years old.  Skin check.  Lung cancer screening. You may have this screening every year starting at age 67 if you have a 30-pack-year history of smoking and currently smoke or have quit within the past 15 years.  Fecal occult blood test (FOBT) of the stool. You may have this test every year starting at age 54.  Flexible sigmoidoscopy or colonoscopy. You may have a sigmoidoscopy every 5 years or a colonoscopy every 10 years starting at age 22.  Hepatitis C blood test.  Hepatitis B blood test.  Sexually transmitted disease (STD) testing.  Diabetes screening. This is done by checking your blood sugar (glucose) after you have not eaten for a while (fasting).  You may have this done every 1-3 years.  Bone density scan. This is done to screen for osteoporosis. You may have this done starting at age 87.  Mammogram. This may be done every 1-2 years. Talk to your health care provider about how often you should  have regular mammograms. Talk with your health care provider about your test results, treatment options, and if necessary, the need for more tests. Vaccines  Your health care provider may recommend certain vaccines, such as:  Influenza vaccine. This is recommended every year.  Tetanus, diphtheria, and acellular pertussis (Tdap, Td) vaccine. You may need a Td booster every 10 years.  Zoster vaccine. You may need this after age 35.  Pneumococcal 13-valent conjugate (PCV13) vaccine. One dose is recommended after age 14.  Pneumococcal polysaccharide (PPSV23) vaccine. One dose is recommended after age 91. Talk to your health care provider about which screenings and vaccines you need and how often you need them. This information is not intended to replace advice given to you by your health care provider. Make sure you discuss any questions you have with your health care provider. Document Released: 02/22/2015 Document Revised: 10/16/2015 Document Reviewed: 11/27/2014 Elsevier Interactive Patient Education  2017 [employer].  Fall Prevention in the Home Falls can cause injuries. They can happen to people of all ages. There are many things you can do to make your home safe and to help prevent falls. What can I do on the outside of my home?  Regularly fix the edges of walkways and driveways and fix any cracks.  Remove anything that might make you trip as you walk through a door, such as a raised step or threshold.  Trim any bushes or trees on the path to your home.  Use bright outdoor lighting.  Clear any walking paths of anything that might make someone trip, such as rocks or tools.  Regularly check to see if handrails are loose or broken. Make sure that both sides of any steps have handrails.  Any raised decks and porches should have guardrails on the edges.  Have any leaves, snow, or ice cleared regularly.  Use sand or salt on walking paths during winter.  Clean up any spills in  your garage right away. This includes oil or grease spills. What can I do in the bathroom?  Use night lights.  Install grab bars by the toilet and in the tub and shower. Do not use towel bars as grab bars.  Use non-skid mats or decals in the tub or shower.  If you need to sit down in the shower, use a plastic, non-slip stool.  Keep the floor dry. Clean up any water that spills on the floor as soon as it happens.  Remove soap buildup in the tub or shower regularly.  Attach bath mats securely with double-sided non-slip rug tape.  Do not have throw rugs and other things on the floor that can make you trip. What can I do in the bedroom?  Use night lights.  Make sure that you have a light by your bed that is easy to reach.  Do not use any sheets or blankets that are too big for your bed. They should not hang down onto the floor.  Have a firm chair that has side arms. You can use this for support while you get dressed.  Do not have throw rugs and other things on the floor that can make you trip. What can I do in the kitchen?  Clean up any spills right away.  Avoid walking on wet floors.  Keep items that you use a lot in easy-to-reach places.  If you need to reach something above you, use a strong step stool that has a grab bar.  Keep electrical cords out of the way.  Do not use floor polish or wax that makes floors slippery. If you must use wax, use non-skid floor wax.  Do not have throw rugs and other things on the floor that can make you trip. What can I do with my stairs?  Do not leave any items on the stairs.  Make sure that there are handrails on both sides of the stairs and use them. Fix handrails that are broken or loose. Make sure that handrails are as long as the stairways.  Check any carpeting to make sure that it is firmly attached to the stairs. Fix any carpet that is loose or worn.  Avoid having throw rugs at the top or bottom of the stairs. If you do have  throw rugs, attach them to the floor with carpet tape.  Make sure that you have a light switch at the top of the stairs and the bottom of the stairs. If you do not have them, ask someone to add them for you. What else can I do to help prevent falls?  Wear shoes that:  Do not have high heels.  Have rubber bottoms.  Are comfortable and fit you well.  Are closed at the toe. Do not wear sandals.  If you use a stepladder:  Make sure that it is fully opened. Do not climb a closed stepladder.  Make sure that both sides of the stepladder are locked into place.  Ask someone to hold it for you, if possible.  Clearly mark and make sure that you can see:  Any grab bars or handrails.  First and last steps.  Where the edge of each step is.  Use tools that help you move around (mobility aids) if they are needed. These include:  Canes.  Walkers.  Scooters.  Crutches.  Turn on the lights when you go into a dark area. Replace any light bulbs as soon as they burn out.  Set up your furniture so you have a clear path. Avoid moving your furniture around.  If any of your floors are uneven, fix them.  If there are any pets around you, be aware of where they are.  Review your medicines with your doctor. Some medicines can make you feel dizzy. This can increase your chance of falling. Ask your doctor what other things that you can do to help prevent falls. This information is not intended to replace advice given to you by your health care provider. Make sure you discuss any questions you have with your health care provider. Document Released: 11/22/2008 Document Revised: 07/04/2015 Document Reviewed: 03/02/2014 Elsevier Interactive Patient Education  2017 [employer].

## 2020-06-11 ENCOUNTER — Encounter: Payer: Self-pay | Admitting: Family Medicine

## 2020-06-11 ENCOUNTER — Other Ambulatory Visit: Payer: Self-pay

## 2020-06-11 ENCOUNTER — Ambulatory Visit (INDEPENDENT_AMBULATORY_CARE_PROVIDER_SITE_OTHER): Payer: Medicare HMO | Admitting: Family Medicine

## 2020-06-11 ENCOUNTER — Ambulatory Visit: Payer: Medicare HMO

## 2020-06-11 ENCOUNTER — Other Ambulatory Visit (INDEPENDENT_AMBULATORY_CARE_PROVIDER_SITE_OTHER): Payer: Medicare HMO

## 2020-06-11 VITALS — BP 158/91 | HR 75 | Temp 97.4°F | Ht 69.0 in | Wt 210.2 lb

## 2020-06-11 DIAGNOSIS — Z1211 Encounter for screening for malignant neoplasm of colon: Secondary | ICD-10-CM | POA: Diagnosis not present

## 2020-06-11 DIAGNOSIS — E785 Hyperlipidemia, unspecified: Secondary | ICD-10-CM

## 2020-06-11 DIAGNOSIS — E559 Vitamin D deficiency, unspecified: Secondary | ICD-10-CM

## 2020-06-11 DIAGNOSIS — Z780 Asymptomatic menopausal state: Secondary | ICD-10-CM

## 2020-06-11 DIAGNOSIS — M8589 Other specified disorders of bone density and structure, multiple sites: Secondary | ICD-10-CM | POA: Diagnosis not present

## 2020-06-11 DIAGNOSIS — E538 Deficiency of other specified B group vitamins: Secondary | ICD-10-CM

## 2020-06-11 DIAGNOSIS — E039 Hypothyroidism, unspecified: Secondary | ICD-10-CM

## 2020-06-11 DIAGNOSIS — I10 Essential (primary) hypertension: Secondary | ICD-10-CM | POA: Diagnosis not present

## 2020-06-11 DIAGNOSIS — N3941 Urge incontinence: Secondary | ICD-10-CM | POA: Diagnosis not present

## 2020-06-11 DIAGNOSIS — M8580 Other specified disorders of bone density and structure, unspecified site: Secondary | ICD-10-CM

## 2020-06-11 HISTORY — DX: Other specified disorders of bone density and structure, unspecified site: M85.80

## 2020-06-11 MED ORDER — LISINOPRIL 10 MG PO TABS: 10.0000 mg | ORAL_TABLET | Freq: Every day | ORAL | 2 refills | Status: DC

## 2020-06-11 NOTE — Patient Instructions (Signed)
DASH Eating Plan DASH stands for Dietary Approaches to Stop Hypertension. The DASH eating plan is a healthy eating plan that has been shown to:  Reduce high blood pressure (hypertension).  Reduce your risk for type 2 diabetes, heart disease, and stroke.  Help with weight loss. What are tips for following this plan? Reading food labels  Check food labels for the amount of salt (sodium) per serving. Choose foods with less than 5 percent of the Daily Value of sodium. Generally, foods with less than 300 milligrams (mg) of sodium per serving fit into this eating plan.  To find whole grains, look for the word "whole" as the first word in the ingredient list. Shopping  Buy products labeled as "low-sodium" or "no salt added."  Buy fresh foods. Avoid canned foods and pre-made or frozen meals. Cooking  Avoid adding salt when cooking. Use salt-free seasonings or herbs instead of table salt or sea salt. Check with your health care provider or pharmacist before using salt substitutes.  Do not fry foods. Cook foods using healthy methods such as baking, boiling, grilling, roasting, and broiling instead.  Cook with heart-healthy oils, such as olive, canola, avocado, soybean, or sunflower oil. Meal planning  Eat a balanced diet that includes: ? 4 or more servings of fruits and 4 or more servings of vegetables each day. Try to fill one-half of your plate with fruits and vegetables. ? 6-8 servings of whole grains each day. ? Less than 6 oz (170 g) of lean meat, poultry, or fish each day. A 3-oz (85-g) serving of meat is about the same size as a deck of cards. One egg equals 1 oz (28 g). ? 2-3 servings of low-fat dairy each day. One serving is 1 cup (237 mL). ? 1 serving of nuts, seeds, or beans 5 times each week. ? 2-3 servings of heart-healthy fats. Healthy fats called omega-3 fatty acids are found in foods such as walnuts, flaxseeds, fortified milks, and eggs. These fats are also found in  cold-water fish, such as sardines, salmon, and mackerel.  Limit how much you eat of: ? Canned or prepackaged foods. ? Food that is high in trans fat, such as some fried foods. ? Food that is high in saturated fat, such as fatty meat. ? Desserts and other sweets, sugary drinks, and other foods with added sugar. ? Full-fat dairy products.  Do not salt foods before eating.  Do not eat more than 4 egg yolks a week.  Try to eat at least 2 vegetarian meals a week.  Eat more home-cooked food and less restaurant, buffet, and fast food.   Lifestyle  When eating at a restaurant, ask that your food be prepared with less salt or no salt, if possible.  If you drink alcohol: ? Limit how much you use to:  0-1 drink a day for women who are not pregnant.  0-2 drinks a day for men. ? Be aware of how much alcohol is in your drink. In the [place] one drink equals one 12 oz bottle of beer (355 mL), one 5 oz glass of wine (148 mL), or one 1 oz glass of hard liquor (44 mL). General information  Avoid eating more than 2,300 mg of salt a day. If you have hypertension, you may need to reduce your sodium intake to 1,500 mg a day.  Work with your health care provider to maintain a healthy body weight or to lose weight. Ask what an ideal weight is for you.    Get at least 30 minutes of exercise that causes your heart to beat faster (aerobic exercise) most days of the week. Activities may include walking, swimming, or biking.  Work with your health care provider or dietitian to adjust your eating plan to your individual calorie needs. What foods should I eat? Fruits All fresh, dried, or frozen fruit. Canned fruit in natural juice (without added sugar). Vegetables Fresh or frozen vegetables (raw, steamed, roasted, or grilled). Low-sodium or reduced-sodium tomato and vegetable juice. Low-sodium or reduced-sodium tomato sauce and tomato paste. Low-sodium or reduced-sodium canned vegetables. Grains Whole-grain  or whole-wheat bread. Whole-grain or whole-wheat pasta. Brown rice. Oatmeal. Quinoa. Bulgur. Whole-grain and low-sodium cereals. Pita bread. Low-fat, low-sodium crackers. Whole-wheat flour tortillas. Meats and other proteins Skinless chicken or turkey. Ground chicken or turkey. Pork with fat trimmed off. Fish and seafood. Egg whites. Dried beans, peas, or lentils. Unsalted nuts, nut butters, and seeds. Unsalted canned beans. Lean cuts of beef with fat trimmed off. Low-sodium, lean precooked or cured meat, such as sausages or meat loaves. Dairy Low-fat (1%) or fat-free (skim) milk. Reduced-fat, low-fat, or fat-free cheeses. Nonfat, low-sodium ricotta or cottage cheese. Low-fat or nonfat yogurt. Low-fat, low-sodium cheese. Fats and oils Soft margarine without trans fats. Vegetable oil. Reduced-fat, low-fat, or light mayonnaise and salad dressings (reduced-sodium). Canola, safflower, olive, avocado, soybean, and sunflower oils. Avocado. Seasonings and condiments Herbs. Spices. Seasoning mixes without salt. Other foods Unsalted popcorn and pretzels. Fat-free sweets. The items listed above may not be a complete list of foods and beverages you can eat. Contact a dietitian for more information. What foods should I avoid? Fruits Canned fruit in a light or heavy syrup. Fried fruit. Fruit in cream or butter sauce. Vegetables Creamed or fried vegetables. Vegetables in a cheese sauce. Regular canned vegetables (not low-sodium or reduced-sodium). Regular canned tomato sauce and paste (not low-sodium or reduced-sodium). Regular tomato and vegetable juice (not low-sodium or reduced-sodium). Pickles. Olives. Grains Baked goods made with fat, such as croissants, muffins, or some breads. Dry pasta or rice meal packs. Meats and other proteins Fatty cuts of meat. Ribs. Fried meat. Bacon. Bologna, salami, and other precooked or cured meats, such as sausages or meat loaves. Fat from the back of a pig (fatback).  Bratwurst. Salted nuts and seeds. Canned beans with added salt. Canned or smoked fish. Whole eggs or egg yolks. Chicken or turkey with skin. Dairy Whole or 2% milk, cream, and half-and-half. Whole or full-fat cream cheese. Whole-fat or sweetened yogurt. Full-fat cheese. Nondairy creamers. Whipped toppings. Processed cheese and cheese spreads. Fats and oils Butter. Stick margarine. Lard. Shortening. Ghee. Bacon fat. Tropical oils, such as coconut, palm kernel, or palm oil. Seasonings and condiments Onion salt, garlic salt, seasoned salt, table salt, and sea salt. Worcestershire sauce. Tartar sauce. Barbecue sauce. Teriyaki sauce. Soy sauce, including reduced-sodium. Steak sauce. Canned and packaged gravies. Fish sauce. Oyster sauce. Cocktail sauce. Store-bought horseradish. Ketchup. Mustard. Meat flavorings and tenderizers. Bouillon cubes. Hot sauces. Pre-made or packaged marinades. Pre-made or packaged taco seasonings. Relishes. Regular salad dressings. Other foods Salted popcorn and pretzels. The items listed above may not be a complete list of foods and beverages you should avoid. Contact a dietitian for more information. Where to find more information  National Heart, Lung, and Blood Institute: www.nhlbi.nih.gov  American Heart Association: www.heart.org  Academy of Nutrition and Dietetics: www.eatright.org  National Kidney Foundation: www.kidney.org Summary  The DASH eating plan is a healthy eating plan that has been shown to reduce high   blood pressure (hypertension). It may also reduce your risk for type 2 diabetes, heart disease, and stroke.  When on the DASH eating plan, aim to eat more fresh fruits and vegetables, whole grains, lean proteins, low-fat dairy, and heart-healthy fats.  With the DASH eating plan, you should limit salt (sodium) intake to 2,300 mg a day. If you have hypertension, you may need to reduce your sodium intake to 1,500 mg a day.  Work with your health care  provider or dietitian to adjust your eating plan to your individual calorie needs. This information is not intended to replace advice given to you by your health care provider. Make sure you discuss any questions you have with your health care provider. Document Revised: 12/30/2018 Document Reviewed: 12/30/2018 Elsevier Patient Education  2021 Elsevier Inc.   

## 2020-06-11 NOTE — Progress Notes (Signed)
Assessment & Plan:  1. Essential hypertension Uncontrolled.  Started patient on lisinopril 10 mg once daily. - CBC with Differential/Platelet - CMP14+EGFR - Lipid panel - lisinopril (ZESTRIL) 10 MG tablet; Take 1 tablet (10 mg total) by mouth daily.  Dispense: 30 tablet; Refill: 2  2. Acquired hypothyroidism Well controlled on current regimen.  - TSH - T4, free  3. Dyslipidemia Labs to assess. - Lipid panel  4. Vitamin D deficiency Labs to assess. - VITAMIN D 25 Hydroxy (Vit-D Deficiency, Fractures)  5. Vitamin B12 deficiency Labs to assess.  Vitamin B12 injection given in office. - Vitamin B12  6. Urge incontinence of urine Well controlled on current regimen.  - CMP14+EGFR  7. Colon cancer screening - Cologuard   Return in about 6 weeks (around 07/23/2020) for HTN.  Hendricks Limes, MSN, APRN, FNP-C [facility]  Subjective:    Patient ID: Janet Gardner, female    DOB: 03-Apr-1955, 65 y.o.   MRN: [id number]  Patient Care Team: Loman Brooklyn, FNP as PCP - General (Family Medicine) Gala Romney Cristopher Estimable, MD as Consulting Physician (Gastroenterology)   Chief Complaint:  Chief Complaint  Patient presents with  . Hypothyroidism  . Gastroesophageal Reflux    Check up of chronic medical conditions     HPI: Janet Gardner is a 65 y.o. female presenting on 06/11/2020 for Hypothyroidism and Gastroesophageal Reflux (Check up of chronic medical conditions )  Hypertension: Patient does check her blood pressure at home and reports her systolic ranges 767H-419F.  She does not add salt to her food.  She is walking for exercise.  New complaints: Patient is requesting her B12 injection today.  Social history:  Relevant past medical, surgical, family and social history reviewed and updated as indicated. Interim medical history since our last visit reviewed.  Allergies and medications reviewed and updated.  DATA REVIEWED: CHART IN EPIC  ROS: Negative  unless specifically indicated above in HPI.    Current Outpatient Medications:  .  albuterol (VENTOLIN HFA) 108 (90 Base) MCG/ACT inhaler, Inhale 2 puffs into the lungs every 6 (six) hours as needed for wheezing or shortness of breath., Disp: 18 g, Rfl: 1 .  levothyroxine (SYNTHROID) 125 MCG tablet, Take 1 tablet (125 mcg total) by mouth daily before breakfast., Disp: 90 tablet, Rfl: 3 .  mirabegron ER (MYRBETRIQ) 25 MG TB24 tablet, Take 1 tablet (25 mg total) by mouth daily., Disp: 30 tablet, Rfl: 0  Current Facility-Administered Medications:  .  cyanocobalamin ((VITAMIN B-12)) injection 1,000 mcg, 1,000 mcg, Intramuscular, Q30 days, Hassell Done, Mary-Margaret, FNP, 1,000 mcg at 05/08/20 1053   Allergies  Allergen Reactions  . Levaquin [Levofloxacin] Nausea And Vomiting  . Sulfa Antibiotics Hives    Vomiting   Past Medical History:  Diagnosis Date  . B12 deficiency    monthly injection  . Fibromyalgia   . GERD (gastroesophageal reflux disease)   . Hypothyroidism     Past Surgical History:  Procedure Laterality Date  . ABDOMINAL HYSTERECTOMY    . CHOLECYSTECTOMY    . colonoscopy  2007   Dr. Lavone Neri, incomplete due to poor prep. scope passed to transverse colon. ACBE normal.  . ESOPHAGOGASTRODUODENOSCOPY  07/22/2009   XTK:WIOXBD apperaring esophagus s/p 56 dilator/small HH/large ulcerated gastric polyp in the prepyloric antral area. Inflammatory fibroid polyp.  . ESOPHAGOGASTRODUODENOSCOPY (EGD) WITH ESOPHAGEAL DILATION N/A 11/24/2012   Procedure: ESOPHAGOGASTRODUODENOSCOPY (EGD) WITH ESOPHAGEAL DILATION;  Surgeon: Daneil Dolin, MD;  Location: [facility];  Service: Endoscopy;  Laterality: N/A;  9:30AM  . FOOT SURGERY     Right  . KIDNEY SURGERY      Social History   Socioeconomic History  . Marital status: Married    Spouse name: Not on file  . Number of children: 1  . Years of education: Not on file  . Highest education level: Not on file  Occupational History  .  Occupation: retired    Comment: retired  Tobacco Use  . Smoking status: Never Smoker  . Smokeless tobacco: Never Used  Vaping Use  . Vaping Use: Never used  Substance and Sexual Activity  . Alcohol use: No  . Drug use: No  . Sexual activity: Not on file  Other Topics Concern  . Not on file  Social History Narrative   Lives with her husband and her 82 year old granddaughter. Her daughter lives in [place] Determinants of Health   Financial Resource Strain: Low Risk   . Difficulty of Paying Living Expenses: Not hard at all  Food Insecurity: No Food Insecurity  . Worried About Charity fundraiser in the Last Year: Never true  . Ran Out of Food in the Last Year: Never true  Transportation Needs: No Transportation Needs  . Lack of Transportation (Medical): No  . Lack of Transportation (Non-Medical): No  Physical Activity: Sufficiently Active  . Days of Exercise per Week: 7 days  . Minutes of Exercise per Session: 30 min  Stress: No Stress Concern Present  . Feeling of Stress : Not at all  Social Connections: Socially Integrated  . Frequency of Communication with Friends and Family: More than three times a week  . Frequency of Social Gatherings with Friends and Family: Once a week  . Attends Religious Services: More than 4 times per year  . Active Member of Clubs or Organizations: Yes  . Attends Archivist Meetings: More than 4 times per year  . Marital Status: Married  Human resources officer Violence: Not At Risk  . Fear of Current or Ex-Partner: No  . Emotionally Abused: No  . Physically Abused: No  . Sexually Abused: No        Objective:    BP (!) 158/91   Pulse 75   Temp (!) 97.4 F (36.3 C) (Temporal)   Ht _0  (1.753 m)   Wt 210 lb 3.2 oz (95.3 kg)   SpO2 99%   BMI 31.04 kg/m   Wt Readings from Last 3 Encounters:  06/11/20 210 lb 3.2 oz (95.3 kg)  06/04/20 195 lb (88.5 kg)  03/20/20 210 lb 12.8 oz (95.6 kg)    Physical Exam Vitals  reviewed.  Constitutional:      General: She is not in acute distress.    Appearance: Normal appearance. She is obese. She is not ill-appearing, toxic-appearing or diaphoretic.  HENT:     Head: Normocephalic and atraumatic.  Eyes:     General: No scleral icterus.       Right eye: No discharge.        Left eye: No discharge.     Conjunctiva/sclera: Conjunctivae normal.  Cardiovascular:     Rate and Rhythm: Normal rate and regular rhythm.     Heart sounds: Normal heart sounds. No murmur heard. No friction rub. No gallop.   Pulmonary:     Effort: Pulmonary effort is normal. No respiratory distress.     Breath sounds: Normal breath sounds. No stridor. No wheezing, rhonchi or rales.  Musculoskeletal:  General: Normal range of motion.     Cervical back: Normal range of motion.  Skin:    General: Skin is warm and dry.     Capillary Refill: Capillary refill takes less than 2 seconds.  Neurological:     General: No focal deficit present.     Mental Status: She is alert and oriented to person, place, and time. Mental status is at baseline.  Psychiatric:        Mood and Affect: Mood normal.        Behavior: Behavior normal.        Thought Content: Thought content normal.        Judgment: Judgment normal.     Lab Results  Component Value Date   TSH 0.776 10/27/2019   Lab Results  Component Value Date   WBC 4.5 05/10/2019   HGB 13.2 05/10/2019   HCT 41.5 05/10/2019   MCV 84 05/10/2019   PLT 235 05/10/2019   Lab Results  Component Value Date   NA 138 10/27/2019   K 4.2 10/27/2019   CO2 23 10/27/2019   GLUCOSE 100 (H) 10/27/2019   BUN 9 10/27/2019   CREATININE 0.68 10/27/2019   BILITOT 0.3 10/27/2019   ALKPHOS 85 10/27/2019   AST 14 10/27/2019   ALT 13 10/27/2019   PROT 7.0 10/27/2019   ALBUMIN 4.2 10/27/2019   CALCIUM 8.8 10/27/2019   Lab Results  Component Value Date   CHOL 187 10/27/2019   Lab Results  Component Value Date   HDL 48 10/27/2019   Lab  Results  Component Value Date   LDLCALC 124 (H) 10/27/2019   Lab Results  Component Value Date   TRIG 80 10/27/2019   Lab Results  Component Value Date   CHOLHDL 3.9 10/27/2019   No results found for: HGBA1C

## 2020-06-12 LAB — CBC WITH DIFFERENTIAL/PLATELET
Basophils Absolute: 0 10*3/uL (ref 0.0–0.2)
Basos: 1 %
EOS (ABSOLUTE): 0.2 10*3/uL (ref 0.0–0.4)
Eos: 4 %
Hematocrit: 38.7 % (ref 34.0–46.6)
Hemoglobin: 12.7 g/dL (ref 11.1–15.9)
Immature Grans (Abs): 0 10*3/uL (ref 0.0–0.1)
Immature Granulocytes: 0 %
Lymphocytes Absolute: 1.1 10*3/uL (ref 0.7–3.1)
Lymphs: 25 %
MCH: 27.2 pg (ref 26.6–33.0)
MCHC: 32.8 g/dL (ref 31.5–35.7)
MCV: 83 fL (ref 79–97)
Monocytes Absolute: 0.4 10*3/uL (ref 0.1–0.9)
Monocytes: 8 %
Neutrophils Absolute: 2.8 10*3/uL (ref 1.4–7.0)
Neutrophils: 62 %
Platelets: 234 10*3/uL (ref 150–450)
RBC: 4.67 x10E6/uL (ref 3.77–5.28)
RDW: 13.8 % (ref 11.7–15.4)
WBC: 4.5 10*3/uL (ref 3.4–10.8)

## 2020-06-12 LAB — CMP14+EGFR
ALT: 13 IU/L (ref 0–32)
AST: 13 IU/L (ref 0–40)
Albumin/Globulin Ratio: 1.2 (ref 1.2–2.2)
Albumin: 3.9 g/dL (ref 3.8–4.8)
Alkaline Phosphatase: 84 IU/L (ref 44–121)
BUN/Creatinine Ratio: 23 (ref 12–28)
BUN: 14 mg/dL (ref 8–27)
Bilirubin Total: 0.3 mg/dL (ref 0.0–1.2)
CO2: 22 mmol/L (ref 20–29)
Calcium: 8.8 mg/dL (ref 8.7–10.3)
Chloride: 107 mmol/L — ABNORMAL HIGH (ref 96–106)
Creatinine, Ser: 0.61 mg/dL (ref 0.57–1.00)
Globulin, Total: 3.3 g/dL (ref 1.5–4.5)
Glucose: 101 mg/dL — ABNORMAL HIGH (ref 65–99)
Potassium: 4.6 mmol/L (ref 3.5–5.2)
Sodium: 143 mmol/L (ref 134–144)
Total Protein: 7.2 g/dL (ref 6.0–8.5)
eGFR: 98 mL/min/{1.73_m2} (ref >59)

## 2020-06-12 LAB — VITAMIN B12: Vitamin B-12: >2000 pg/mL — ABNORMAL HIGH (ref 232–1245)

## 2020-06-12 LAB — LIPID PANEL
Chol/HDL Ratio: 3.7 ratio (ref 0.0–4.4)
Cholesterol, Total: 169 mg/dL (ref 100–199)
HDL: 46 mg/dL (ref >39)
LDL Chol Calc (NIH): 111 mg/dL — ABNORMAL HIGH (ref 0–99)
Triglycerides: 60 mg/dL (ref 0–149)
VLDL Cholesterol Cal: 12 mg/dL (ref 5–40)

## 2020-06-12 LAB — VITAMIN D 25 HYDROXY (VIT D DEFICIENCY, FRACTURES): Vit D, 25-Hydroxy: 25.5 ng/mL — ABNORMAL LOW (ref 30.0–100.0)

## 2020-06-12 LAB — TSH: TSH: 0.87 u[IU]/mL (ref 0.450–4.500)

## 2020-06-12 LAB — T4, FREE: Free T4: 1.86 ng/dL — ABNORMAL HIGH (ref 0.82–1.77)

## 2020-06-20 ENCOUNTER — Encounter: Payer: Self-pay | Admitting: Family Medicine

## 2020-06-20 DIAGNOSIS — E559 Vitamin D deficiency, unspecified: Secondary | ICD-10-CM

## 2020-06-21 MED ORDER — VITAMIN D (ERGOCALCIFEROL) 1.25 MG (50000 UNIT) PO CAPS: 50000.0000 [IU] | ORAL_CAPSULE | ORAL | 1 refills | Status: DC

## 2020-06-25 DIAGNOSIS — Z1211 Encounter for screening for malignant neoplasm of colon: Secondary | ICD-10-CM | POA: Diagnosis not present

## 2020-07-01 DIAGNOSIS — R059 Cough, unspecified: Secondary | ICD-10-CM | POA: Diagnosis not present

## 2020-07-01 DIAGNOSIS — J329 Chronic sinusitis, unspecified: Secondary | ICD-10-CM | POA: Diagnosis not present

## 2020-07-01 DIAGNOSIS — R42 Dizziness and giddiness: Secondary | ICD-10-CM | POA: Diagnosis not present

## 2020-07-01 DIAGNOSIS — J3489 Other specified disorders of nose and nasal sinuses: Secondary | ICD-10-CM | POA: Diagnosis not present

## 2020-07-03 ENCOUNTER — Ambulatory Visit (INDEPENDENT_AMBULATORY_CARE_PROVIDER_SITE_OTHER): Payer: Medicare HMO | Admitting: Family Medicine

## 2020-07-03 ENCOUNTER — Other Ambulatory Visit: Payer: Self-pay

## 2020-07-03 ENCOUNTER — Encounter: Payer: Self-pay | Admitting: Family Medicine

## 2020-07-03 VITALS — BP 129/81 | HR 91 | Temp 96.3°F | Ht 69.0 in | Wt 208.2 lb

## 2020-07-03 DIAGNOSIS — J0140 Acute pansinusitis, unspecified: Secondary | ICD-10-CM

## 2020-07-03 MED ORDER — AMOXICILLIN-POT CLAVULANATE 875-125 MG PO TABS: 1.0000 | ORAL_TABLET | Freq: Two times a day (BID) | ORAL | 0 refills | Status: AC

## 2020-07-03 NOTE — Progress Notes (Signed)
Assessment & Plan:  1. Acute non-recurrent pansinusitis Education provided on sinusitis.  Symptom management.  Azithromycin added to her allergy list. Continue Prednisone as ordered.  - amoxicillin-clavulanate (AUGMENTIN) 875-125 MG tablet; Take 1 tablet by mouth 2 (two) times daily for 7 days.  Dispense: 14 tablet; Refill: 0   Follow up plan: Return if symptoms worsen or fail to improve.  Deliah Boston, MSN, APRN, FNP-C [facility]  Subjective:   Patient ID: Janet Gardner, female    DOB: [birth year], 65 y.o.   MRN: [id number]  HPI: Janet Gardner is a 65 y.o. female presenting on 07/03/2020 for Sinus Problem (Patient states she has been having sinus pain and dental pain x 1 week) and Dizziness (When laying down on the right side x 1 week)  Patient reports sinus pain, runny nose, postnasal drainage, and diarrhea.  Her symptoms have been ongoing for the past week.  She has tried Sudafed without relief.  She did go to urgent care where she was prescribed a Z-Pak and prednisone.  She states after she started the Z-Pak she had a large amount of bright red blood in her urine.  When she stopped the Z-Pak the blood in her urine resolved.   ROS: Negative unless specifically indicated above in HPI.   Relevant past medical history reviewed and updated as indicated.   Allergies and medications reviewed and updated.   Current Outpatient Medications:  .  albuterol (VENTOLIN HFA) 108 (90 Base) MCG/ACT inhaler, Inhale 2 puffs into the lungs every 6 (six) hours as needed for wheezing or shortness of breath., Disp: 18 g, Rfl: 1 .  levothyroxine (SYNTHROID) 125 MCG tablet, Take 1 tablet (125 mcg total) by mouth daily before breakfast., Disp: 90 tablet, Rfl: 3 .  lisinopril (ZESTRIL) 10 MG tablet, Take 1 tablet (10 mg total) by mouth daily., Disp: 30 tablet, Rfl: 2 .  Vitamin D, Ergocalciferol, (DRISDOL) 1.25 MG (50000 UNIT) CAPS capsule, Take 1 capsule (50,000 Units total) by mouth  every 7 (seven) days., Disp: 12 capsule, Rfl: 1  Allergies  Allergen Reactions  . Levaquin [Levofloxacin] Nausea And Vomiting  . Sulfa Antibiotics Hives    Vomiting    Objective:   BP 129/81   Pulse 91   Temp (!) 96.3 F (35.7 C) (Temporal)   Ht 5\' 9"  (1.753 m)   Wt 208 lb 3.2 oz (94.4 kg)   SpO2 98%   BMI 30.75 kg/m    Physical Exam Vitals reviewed.  Constitutional:      General: She is not in acute distress.    Appearance: Normal appearance. She is not ill-appearing, toxic-appearing or diaphoretic.  HENT:     Head: Normocephalic and atraumatic.     Right Ear: Tympanic membrane, ear canal and external ear normal. There is no impacted cerumen.     Left Ear: Tympanic membrane, ear canal and external ear normal. There is no impacted cerumen.     Nose: Congestion present.     Right Sinus: Maxillary sinus tenderness and frontal sinus tenderness present.     Left Sinus: Maxillary sinus tenderness and frontal sinus tenderness present.     Mouth/Throat:     Mouth: Mucous membranes are moist.     Pharynx: Oropharynx is clear. Posterior oropharyngeal erythema present. No oropharyngeal exudate.  Eyes:     General: No scleral icterus.       Right eye: No discharge.        Left eye: No discharge.  Conjunctiva/sclera: Conjunctivae normal.  Cardiovascular:     Rate and Rhythm: Normal rate and regular rhythm.     Heart sounds: Normal heart sounds. No murmur heard. No friction rub. No gallop.   Pulmonary:     Effort: Pulmonary effort is normal. No respiratory distress.     Breath sounds: Normal breath sounds. No stridor. No wheezing, rhonchi or rales.  Musculoskeletal:        General: Normal range of motion.     Cervical back: Normal range of motion.  Lymphadenopathy:     Cervical: No cervical adenopathy.  Skin:    General: Skin is warm and dry.     Capillary Refill: Capillary refill takes less than 2 seconds.  Neurological:     General: No focal deficit present.      Mental Status: She is alert and oriented to person, place, and time. Mental status is at baseline.  Psychiatric:        Mood and Affect: Mood normal.        Behavior: Behavior normal.        Thought Content: Thought content normal.        Judgment: Judgment normal.

## 2020-07-03 NOTE — Patient Instructions (Signed)

## 2020-07-04 LAB — COLOGUARD: COLOGUARD: NEGATIVE

## 2020-07-08 ENCOUNTER — Other Ambulatory Visit: Payer: Self-pay | Admitting: Family Medicine

## 2020-07-08 DIAGNOSIS — E039 Hypothyroidism, unspecified: Secondary | ICD-10-CM

## 2020-07-11 LAB — COLOGUARD: Cologuard: NEGATIVE

## 2020-08-09 ENCOUNTER — Other Ambulatory Visit: Payer: Self-pay

## 2020-08-09 ENCOUNTER — Encounter: Payer: Self-pay | Admitting: Nurse Practitioner

## 2020-08-09 ENCOUNTER — Ambulatory Visit (INDEPENDENT_AMBULATORY_CARE_PROVIDER_SITE_OTHER): Payer: Medicare HMO | Admitting: Nurse Practitioner

## 2020-08-09 VITALS — BP 151/80 | HR 81 | Temp 96.9°F | Ht 69.0 in | Wt 210.0 lb

## 2020-08-09 DIAGNOSIS — R300 Dysuria: Secondary | ICD-10-CM | POA: Diagnosis not present

## 2020-08-09 LAB — URINALYSIS, ROUTINE W REFLEX MICROSCOPIC
Bilirubin, UA: NEGATIVE
Glucose, UA: NEGATIVE
Ketones, UA: NEGATIVE
Leukocytes,UA: NEGATIVE
Nitrite, UA: NEGATIVE
Protein,UA: NEGATIVE
RBC, UA: NEGATIVE
Specific Gravity, UA: 1.02 (ref 1.005–1.030)
Urobilinogen, Ur: 0.2 mg/dL (ref 0.2–1.0)
pH, UA: 5.5 (ref 5.0–7.5)

## 2020-08-09 NOTE — Assessment & Plan Note (Signed)
Symptoms of frequency, foul odor and urgency last 24 hours.  Complete  urinalysis results pending.  No CVA.  Tenderness, nausea or vomiting.

## 2020-08-09 NOTE — Progress Notes (Signed)
Acute Office Visit  Subjective:    Patient ID: Janet Gardner, female    DOB: 04-03-1955, 65 y.o.   MRN: [id number]  Chief Complaint  Patient presents with   Urinary Frequency   urine odor    Urinary Frequency  This is a recurrent problem. The current episode started yesterday. The problem occurs every urination. The problem has been unchanged. The patient is experiencing no pain. There has been no fever. Associated symptoms include frequency and urgency. Pertinent negatives include no chills, discharge or hematuria. She has tried nothing for the symptoms.    Past Medical History:  Diagnosis Date   B12 deficiency    monthly injection   Fibromyalgia    GERD (gastroesophageal reflux disease)    Hypothyroidism    Osteopenia 06/11/2020    Past Surgical History:  Procedure Laterality Date   ABDOMINAL HYSTERECTOMY     CHOLECYSTECTOMY     colonoscopy  2007   Dr. Lavone Neri, incomplete due to poor prep. scope passed to transverse colon. ACBE normal.   ESOPHAGOGASTRODUODENOSCOPY  07/22/2009   [id number] apperaring esophagus s/p 56 dilator/small HH/large ulcerated gastric polyp in the prepyloric antral area. Inflammatory fibroid polyp.   ESOPHAGOGASTRODUODENOSCOPY (EGD) WITH ESOPHAGEAL DILATION N/A 11/24/2012   Procedure: ESOPHAGOGASTRODUODENOSCOPY (EGD) WITH ESOPHAGEAL DILATION;  Surgeon: Daneil Dolin, MD;  Location: [facility];  Service: Endoscopy;  Laterality: N/A;  9:30AM   FOOT SURGERY     Right   KIDNEY SURGERY      Family History  Problem Relation Age of Onset   Breast cancer Mother    Heart disease Father    Colon cancer Neg Hx     Social History   Socioeconomic History   Marital status: Married    Spouse name: Not on file   Number of children: 1   Years of education: Not on file   Highest education level: Not on file  Occupational History   Occupation: retired    Comment: retired  Tobacco Use   Smoking status: Never   Smokeless tobacco: Never  Brewing technologist Use: Never used  Substance and Sexual Activity   Alcohol use: No   Drug use: No   Sexual activity: Not on file  Other Topics Concern   Not on file  Social History Narrative   Lives with her husband and her 17 year old granddaughter. Her daughter lives in [place] Resource Strain: Low Risk    Difficulty of Paying Living Expenses: Not hard at all  Food Insecurity: No Food Insecurity   Worried About Charity fundraiser in the Last Year: Never true   Arboriculturist in the Last Year: Never true  Transportation Needs: No Transportation Needs   Lack of Transportation (Medical): No   Lack of Transportation (Non-Medical): No  Physical Activity: Sufficiently Active   Days of Exercise per Week: 7 days   Minutes of Exercise per Session: 30 min  Stress: No Stress Concern Present   Feeling of Stress : Not at all  Social Connections: Socially Integrated   Frequency of Communication with Friends and Family: More than three times a week   Frequency of Social Gatherings with Friends and Family: Once a week   Attends Religious Services: More than 4 times per year   Active Member of [employer] or Organizations: Yes   Attends Archivist Meetings: More than 4 times per year   Marital Status:  Married  Human resources officer Violence: Not At Risk   Fear of Current or Ex-Partner: No   Emotionally Abused: No   Physically Abused: No   Sexually Abused: No    Outpatient Medications Prior to Visit  Medication Sig Dispense Refill   albuterol (VENTOLIN HFA) 108 (90 Base) MCG/ACT inhaler Inhale 2 puffs into the lungs every 6 (six) hours as needed for wheezing or shortness of breath. 18 g 1   levothyroxine (SYNTHROID) 125 MCG tablet TAKE 1 TABLET (125 MCG TOTAL) BY MOUTH DAILY BEFORE BREAKFAST. 90 tablet 3   lisinopril (ZESTRIL) 10 MG tablet Take 1 tablet (10 mg total) by mouth daily. 30 tablet 2   Vitamin D, Ergocalciferol, (DRISDOL) 1.25 MG (50000 UNIT)  CAPS capsule Take 1 capsule (50,000 Units total) by mouth every 7 (seven) days. 12 capsule 1   No facility-administered medications prior to visit.    Allergies  Allergen Reactions   Azithromycin Other (See Comments)    Caused hematuria.   Levaquin [Levofloxacin] Nausea And Vomiting   Sulfa Antibiotics Hives    Vomiting    Review of Systems  Constitutional:  Negative for chills.  HENT: Negative.    Respiratory: Negative.    Gastrointestinal: Negative.   Genitourinary:  Positive for frequency and urgency. Negative for hematuria, pelvic pain, vaginal bleeding, vaginal discharge and vaginal pain.  Skin:  Negative for rash.  All other systems reviewed and are negative.     Objective:    Physical Exam Vitals and nursing note reviewed.  Constitutional:      Appearance: Normal appearance.  HENT:     Head: Normocephalic.     Nose: Nose normal.  Eyes:     Conjunctiva/sclera: Conjunctivae normal.  Cardiovascular:     Rate and Rhythm: Normal rate and regular rhythm.  Abdominal:     General: Bowel sounds are normal.  Skin:    Findings: No rash.  Neurological:     Mental Status: She is alert and oriented to person, place, and time.  Psychiatric:        Behavior: Behavior normal.    BP (!) 151/80   Pulse 81   Temp (!) 96.9 F (36.1 C) (Temporal)   Ht '5\' 9"'  (1.753 m)   Wt 210 lb (95.3 kg)   SpO2 98%   BMI 31.01 kg/m  Wt Readings from Last 3 Encounters:  08/09/20 210 lb (95.3 kg)  07/03/20 208 lb 3.2 oz (94.4 kg)  06/11/20 210 lb 3.2 oz (95.3 kg)    Health Maintenance Due  Topic Date Due   Zoster Vaccines- Shingrix (1 of 2) Never done   COVID-19 Vaccine (4 - Booster for Moderna series) 07/03/2020    There are no preventive care reminders to display for this patient.   Lab Results  Component Value Date   TSH 0.870 06/11/2020   Lab Results  Component Value Date   WBC 4.5 06/11/2020   HGB 12.7 06/11/2020   HCT 38.7 06/11/2020   MCV 83 06/11/2020   PLT 234  06/11/2020   Lab Results  Component Value Date   NA 143 06/11/2020   K 4.6 06/11/2020   CO2 22 06/11/2020   GLUCOSE 101 (H) 06/11/2020   BUN 14 06/11/2020   CREATININE 0.61 06/11/2020   BILITOT 0.3 06/11/2020   ALKPHOS 84 06/11/2020   AST 13 06/11/2020   ALT 13 06/11/2020   PROT 7.2 06/11/2020   ALBUMIN 3.9 06/11/2020   CALCIUM 8.8 06/11/2020   EGFR 98 06/11/2020  Lab Results  Component Value Date   CHOL 169 06/11/2020   Lab Results  Component Value Date   HDL 46 06/11/2020   Lab Results  Component Value Date   LDLCALC 111 (H) 06/11/2020   Lab Results  Component Value Date   TRIG 60 06/11/2020   Lab Results  Component Value Date   CHOLHDL 3.7 06/11/2020   No results found for: HGBA1C     Assessment & Plan:   Problem List Items Addressed This Visit       Other   Dysuria - Primary    Symptoms of frequency, foul odor and urgency last 24 hours.  Complete  urinalysis results pending.  No CVA.  Tenderness, nausea or vomiting.       Relevant Orders   Urinalysis, Routine w reflex microscopic     No orders of the defined types were placed in this encounter.    Ivy Lynn, NP

## 2020-08-09 NOTE — Patient Instructions (Signed)

## 2020-08-12 ENCOUNTER — Encounter: Payer: Self-pay | Admitting: Family Medicine

## 2020-09-03 ENCOUNTER — Encounter: Payer: Self-pay | Admitting: Family Medicine

## 2020-09-04 ENCOUNTER — Encounter: Payer: Self-pay | Admitting: Family

## 2020-09-04 ENCOUNTER — Other Ambulatory Visit: Payer: Self-pay

## 2020-09-04 ENCOUNTER — Ambulatory Visit (INDEPENDENT_AMBULATORY_CARE_PROVIDER_SITE_OTHER): Payer: Medicare HMO | Admitting: Family

## 2020-09-04 VITALS — BP 141/77 | HR 78 | Temp 98.0°F | Ht 69.0 in | Wt 211.8 lb

## 2020-09-04 DIAGNOSIS — I10 Essential (primary) hypertension: Secondary | ICD-10-CM | POA: Diagnosis not present

## 2020-09-04 DIAGNOSIS — M797 Fibromyalgia: Secondary | ICD-10-CM | POA: Diagnosis not present

## 2020-09-04 MED ORDER — MELOXICAM 15 MG PO TABS: 15.0000 mg | ORAL_TABLET | Freq: Every day | ORAL | 0 refills | Status: DC

## 2020-09-04 MED ORDER — DICLOFENAC SODIUM 1 % EX GEL: 2.0000 g | Freq: Four times a day (QID) | CUTANEOUS | 2 refills | Status: DC

## 2020-09-04 MED ORDER — DULOXETINE HCL 30 MG PO CPEP: 30.0000 mg | ORAL_CAPSULE | Freq: Every day | ORAL | 1 refills | Status: DC

## 2020-09-04 NOTE — Progress Notes (Signed)
Subjective:    Patient ID: Janet Gardner, female    DOB: 09/04/1955, 65 y.o.   MRN: [id number]  Chief Complaint  Patient presents with   Fibromyalgia    Is acting up wants something stronger than tylenol     HPI PT presents to the office today to discuss pain. She reports she has Fibromyalgia and had a flare up yesterday. She reports she was having pain in her her left shoulder and "all over". She reports all her muscles are sore and hurt if she touches them. Her pain constant aching pain of 8 out 10. She has taken mobic in the past and wants to know if she can restart.   Her BP is elevated. She reports she has not taken her lisinopril this morning. She states she monitors this at home and it runs 130's/70's.    Review of Systems  All other systems reviewed and are negative.     Objective:   Physical Exam Vitals reviewed.  Constitutional:      General: She is not in acute distress.    Appearance: She is well-developed.  HENT:     Head: Normocephalic and atraumatic.     Right Ear: Tympanic membrane normal.     Left Ear: Tympanic membrane normal.  Eyes:     Pupils: Pupils are equal, round, and reactive to light.  Neck:     Thyroid: No thyromegaly.  Cardiovascular:     Rate and Rhythm: Normal rate and regular rhythm.     Heart sounds: Normal heart sounds. No murmur heard. Pulmonary:     Effort: Pulmonary effort is normal. No respiratory distress.     Breath sounds: Normal breath sounds. No wheezing.  Abdominal:     General: Bowel sounds are normal. There is no distension.     Palpations: Abdomen is soft.     Tenderness: There is no abdominal tenderness.  Musculoskeletal:        General: No tenderness. Normal range of motion.     Cervical back: Normal range of motion and neck supple.  Skin:    General: Skin is warm and dry.  Neurological:     Mental Status: She is alert and oriented to person, place, and time.     Cranial Nerves: No cranial nerve deficit.     Deep  Tendon Reflexes: Reflexes are normal and symmetric.  Psychiatric:        Behavior: Behavior normal.        Thought Content: Thought content normal.        Judgment: Judgment normal.      BP (!) 141/77   Pulse 78   Temp 98 F (36.7 C) (Temporal)   Ht 5\' 9"  (1.753 m)   Wt 211 lb 12.8 oz (96.1 kg)   BMI 31.28 kg/m      Assessment & Plan:  Janet Gardner comes in today with chief complaint of Fibromyalgia (Is acting up wants something stronger than tylenol )   Diagnosis and orders addressed:  1. Fibromyalgia Will start Cymbalta 30 mg today Restart Mobic daily, no other NSAID"s Diclofenac gel as needed  RTO in 1 month to recheck. May increase Cymbalta to 60 mg  - DULoxetine (CYMBALTA) 30 MG capsule; Take 1 capsule (30 mg total) by mouth daily.  Dispense: 90 capsule; Refill: 1 - meloxicam (MOBIC) 15 MG tablet; Take 1 tablet (15 mg total) by mouth daily.  Dispense: 90 tablet; Refill: 0 - diclofenac Sodium (VOLTAREN) 1 % GEL;  Apply 2 g topically 4 (four) times daily.  Dispense: 150 g; Refill: 2  2. Essential hypertension Take Lisinopril when you get home Continue to monitor at home and let us know if >140/90.    Janet Rodney, FNP

## 2020-09-04 NOTE — Patient Instructions (Signed)

## 2020-09-20 ENCOUNTER — Other Ambulatory Visit: Payer: Medicare HMO

## 2020-10-03 ENCOUNTER — Encounter: Payer: Self-pay | Admitting: Family Medicine

## 2020-10-03 ENCOUNTER — Ambulatory Visit (INDEPENDENT_AMBULATORY_CARE_PROVIDER_SITE_OTHER): Payer: Medicare HMO | Admitting: Family Medicine

## 2020-10-03 ENCOUNTER — Other Ambulatory Visit: Payer: Self-pay

## 2020-10-03 VITALS — BP 149/83 | HR 69 | Temp 97.5°F | Ht 69.0 in | Wt 208.0 lb

## 2020-10-03 DIAGNOSIS — M797 Fibromyalgia: Secondary | ICD-10-CM | POA: Diagnosis not present

## 2020-10-03 DIAGNOSIS — I10 Essential (primary) hypertension: Secondary | ICD-10-CM | POA: Diagnosis not present

## 2020-10-03 DIAGNOSIS — G479 Sleep disorder, unspecified: Secondary | ICD-10-CM

## 2020-10-03 MED ORDER — LISINOPRIL 10 MG PO TABS: 10.0000 mg | ORAL_TABLET | Freq: Every day | ORAL | 1 refills | Status: DC

## 2020-10-03 NOTE — Patient Instructions (Addendum)
Reminder: please return after mid-September for your flu shot. If you receive this elsewhere, such as your pharmacy, please let us know so we can get this documented in your chart.   Melatonin, Unisom, or Zzzquil for sleep.  Monitor your blood pressure at home, keep a log, and bring it back with you to your next appointment.

## 2020-10-03 NOTE — Progress Notes (Signed)
Assessment & Plan:  1. Fibromyalgia Well controlled on current regimen.   2. Essential hypertension Patient has not yet taken her medication this morning. Encouraged to monitor at home, keep a log, and bring it back with her to her next appointment.  - lisinopril (ZESTRIL) 10 MG tablet; Take 1 tablet (10 mg total) by mouth daily.  Dispense: 90 tablet; Refill: 1  3. Difficulty sleeping Encouraged to try Melatonin, Unisom, or Zzzquil for sleep. Education provided on quality sleep.   Return in about 3 months (around 01/03/2021) for follow-up of chronic medication conditions.  Hendricks Limes, MSN, APRN, FNP-C [facility]  Subjective:    Patient ID: Janet Gardner, female    DOB: 04/04/1955, 65 y.o.   MRN: [id number]  Patient Care Team: Loman Brooklyn, FNP as PCP - General (Family Medicine) Gala Romney Cristopher Estimable, MD as Consulting Physician (Gastroenterology)   Chief Complaint:  Chief Complaint  Patient presents with   Fibromyalgia    1 month follow up - patient states it has improved    HPI: Janet Gardner is a 65 y.o. female presenting on 10/03/2020 for Fibromyalgia (1 month follow up - patient states it has improved)  Patient is following up on started Cymbalta for fibromyalgia pain. She states it has been great. She has never taken anything that helped this much. She is able to get up and do things without her family assisting her. She did initially experience some dizziness when she started the Cymbalta, but it has resolved.   New complaints: Patient states she has trouble sleeping. She has never tried anything in the past to help.   Social history:  Relevant past medical, surgical, family and social history reviewed and updated as indicated. Interim medical history since our last visit reviewed.  Allergies and medications reviewed and updated.  DATA REVIEWED: CHART IN EPIC  ROS: Negative unless specifically indicated above in HPI.    Current  Outpatient Medications:    albuterol (VENTOLIN HFA) 108 (90 Base) MCG/ACT inhaler, Inhale 2 puffs into the lungs every 6 (six) hours as needed for wheezing or shortness of breath., Disp: 18 g, Rfl: 1   DULoxetine (CYMBALTA) 30 MG capsule, Take 1 capsule (30 mg total) by mouth daily., Disp: 90 capsule, Rfl: 1   levothyroxine (SYNTHROID) 125 MCG tablet, TAKE 1 TABLET (125 MCG TOTAL) BY MOUTH DAILY BEFORE BREAKFAST., Disp: 90 tablet, Rfl: 3   lisinopril (ZESTRIL) 10 MG tablet, Take 1 tablet (10 mg total) by mouth daily., Disp: 30 tablet, Rfl: 2   Vitamin D, Ergocalciferol, (DRISDOL) 1.25 MG (50000 UNIT) CAPS capsule, Take 1 capsule (50,000 Units total) by mouth every 7 (seven) days., Disp: 12 capsule, Rfl: 1   Allergies  Allergen Reactions   Azithromycin Other (See Comments)    Caused hematuria.   Levaquin [Levofloxacin] Nausea And Vomiting   Sulfa Antibiotics Hives    Vomiting   Past Medical History:  Diagnosis Date   B12 deficiency    monthly injection   Fibromyalgia    GERD (gastroesophageal reflux disease)    Hypothyroidism    Osteopenia 06/11/2020    Past Surgical History:  Procedure Laterality Date   ABDOMINAL HYSTERECTOMY     CHOLECYSTECTOMY     colonoscopy  2007   Dr. Lavone Neri, incomplete due to poor prep. scope passed to transverse colon. ACBE normal.   ESOPHAGOGASTRODUODENOSCOPY  07/22/2009   [id number] apperaring esophagus s/p 56 dilator/small HH/large ulcerated gastric polyp in the prepyloric antral area. Inflammatory fibroid  polyp.   ESOPHAGOGASTRODUODENOSCOPY (EGD) WITH ESOPHAGEAL DILATION N/A 11/24/2012   Procedure: ESOPHAGOGASTRODUODENOSCOPY (EGD) WITH ESOPHAGEAL DILATION;  Surgeon: Daneil Dolin, MD;  Location: [facility];  Service: Endoscopy;  Laterality: N/A;  9:30AM   FOOT SURGERY     Right   KIDNEY SURGERY      Social History   Socioeconomic History   Marital status: Married    Spouse name: Not on file   Number of children: 1   Years of education: Not on  file   Highest education level: Not on file  Occupational History   Occupation: retired    Comment: retired  Tobacco Use   Smoking status: Never   Smokeless tobacco: Never  Scientific laboratory technician Use: Never used  Substance and Sexual Activity   Alcohol use: No   Drug use: No   Sexual activity: Not on file  Other Topics Concern   Not on file  Social History Narrative   Lives with her husband and her 33 year old granddaughter. Her daughter lives in [place] Resource Strain: Low Risk    Difficulty of Paying Living Expenses: Not hard at all  Food Insecurity: No Food Insecurity   Worried About Charity fundraiser in the Last Year: Never true   Arboriculturist in the Last Year: Never true  Transportation Needs: No Transportation Needs   Lack of Transportation (Medical): No   Lack of Transportation (Non-Medical): No  Physical Activity: Sufficiently Active   Days of Exercise per Week: 7 days   Minutes of Exercise per Session: 30 min  Stress: No Stress Concern Present   Feeling of Stress : Not at all  Social Connections: Socially Integrated   Frequency of Communication with Friends and Family: More than three times a week   Frequency of Social Gatherings with Friends and Family: Once a week   Attends Religious Services: More than 4 times per year   Active Member of [employer] or Organizations: Yes   Attends Music therapist: More than 4 times per year   Marital Status: Married  Human resources officer Violence: Not At Risk   Fear of Current or Ex-Partner: No   Emotionally Abused: No   Physically Abused: No   Sexually Abused: No        Objective:    BP (!) 149/83   Pulse 69   Temp (!) 97.5 F (36.4 C) (Temporal)   Ht '5\' 9"'  (1.753 m)   Wt 208 lb (94.3 kg)   SpO2 97%   BMI 30.72 kg/m   Wt Readings from Last 3 Encounters:  10/03/20 208 lb (94.3 kg)  09/04/20 211 lb 12.8 oz (96.1 kg)  08/09/20 210 lb (95.3 kg)    Physical  Exam Vitals reviewed.  Constitutional:      General: She is not in acute distress.    Appearance: Normal appearance. She is obese. She is not ill-appearing, toxic-appearing or diaphoretic.  HENT:     Head: Normocephalic and atraumatic.  Eyes:     General: No scleral icterus.       Right eye: No discharge.        Left eye: No discharge.     Conjunctiva/sclera: Conjunctivae normal.  Cardiovascular:     Rate and Rhythm: Normal rate.  Pulmonary:     Effort: Pulmonary effort is normal. No respiratory distress.  Musculoskeletal:        General: Normal range  of motion.     Cervical back: Normal range of motion.  Skin:    General: Skin is warm and dry.     Capillary Refill: Capillary refill takes less than 2 seconds.  Neurological:     General: No focal deficit present.     Mental Status: She is alert and oriented to person, place, and time. Mental status is at baseline.  Psychiatric:        Mood and Affect: Mood normal.        Behavior: Behavior normal.        Thought Content: Thought content normal.        Judgment: Judgment normal.    Lab Results  Component Value Date   TSH 0.870 06/11/2020   Lab Results  Component Value Date   WBC 4.5 06/11/2020   HGB 12.7 06/11/2020   HCT 38.7 06/11/2020   MCV 83 06/11/2020   PLT 234 06/11/2020   Lab Results  Component Value Date   NA 143 06/11/2020   K 4.6 06/11/2020   CO2 22 06/11/2020   GLUCOSE 101 (H) 06/11/2020   BUN 14 06/11/2020   CREATININE 0.61 06/11/2020   BILITOT 0.3 06/11/2020   ALKPHOS 84 06/11/2020   AST 13 06/11/2020   ALT 13 06/11/2020   PROT 7.2 06/11/2020   ALBUMIN 3.9 06/11/2020   CALCIUM 8.8 06/11/2020   EGFR 98 06/11/2020   Lab Results  Component Value Date   CHOL 169 06/11/2020   Lab Results  Component Value Date   HDL 46 06/11/2020   Lab Results  Component Value Date   LDLCALC 111 (H) 06/11/2020   Lab Results  Component Value Date   TRIG 60 06/11/2020   Lab Results  Component Value Date    CHOLHDL 3.7 06/11/2020   No results found for: HGBA1C

## 2020-10-05 ENCOUNTER — Encounter: Payer: Self-pay | Admitting: Family Medicine

## 2020-10-07 ENCOUNTER — Encounter: Payer: Self-pay | Admitting: Family Medicine

## 2020-10-07 NOTE — Telephone Encounter (Signed)
Yes, no interactions

## 2020-11-07 ENCOUNTER — Encounter: Payer: Self-pay | Admitting: Family Medicine

## 2020-11-11 ENCOUNTER — Other Ambulatory Visit: Payer: Self-pay

## 2020-11-11 ENCOUNTER — Ambulatory Visit (INDEPENDENT_AMBULATORY_CARE_PROVIDER_SITE_OTHER): Payer: Medicare HMO

## 2020-11-11 DIAGNOSIS — Z23 Encounter for immunization: Secondary | ICD-10-CM

## 2020-11-18 ENCOUNTER — Ambulatory Visit (INDEPENDENT_AMBULATORY_CARE_PROVIDER_SITE_OTHER): Payer: Medicare HMO | Admitting: Nurse Practitioner

## 2020-11-18 ENCOUNTER — Other Ambulatory Visit: Payer: Self-pay

## 2020-11-18 ENCOUNTER — Encounter: Payer: Self-pay | Admitting: Nurse Practitioner

## 2020-11-18 VITALS — BP 151/80 | HR 82 | Temp 97.6°F | Ht 69.0 in | Wt 208.0 lb

## 2020-11-18 DIAGNOSIS — R21 Rash and other nonspecific skin eruption: Secondary | ICD-10-CM | POA: Insufficient documentation

## 2020-11-18 MED ORDER — TRIAMCINOLONE ACETONIDE 0.1 % EX CREA: 1.0000 "application " | TOPICAL_CREAM | Freq: Two times a day (BID) | CUTANEOUS | 0 refills | Status: DC

## 2020-11-18 NOTE — Patient Instructions (Signed)
Rash, Adult  A rash is a change in the color of your skin. A rash can also change the way your skin feels. There are many different conditions and factors that can causea rash. Follow these instructions at home: The goal of treatment is to stop the itching and keep the rash from spreading. Watch for any changes in your symptoms. Let your doctor know about them. Followthese instructions to help with your condition: Medicine Take or apply over-the-counter and prescription medicines only as told by your doctor. These may include medicines: To treat red or swollen skin (corticosteroid creams). To treat itching. To treat an allergy (oral antihistamines). To treat very bad symptoms (oral corticosteroids).  Skin care Put cool cloths (compresses) on the affected areas. Do not scratch or rub your skin. Avoid covering the rash. Make sure that the rash is exposed to air as much as possible. Managing itching and discomfort Avoid hot showers or baths. These can make itching worse. A cold shower may help. Try taking a bath with: Epsom salts. You can get these at your local pharmacy or grocery store. Follow the instructions on the package. Baking soda. Pour a small amount into the bath as told by your doctor. Colloidal oatmeal. You can get this at your local pharmacy or grocery store. Follow the instructions on the package. Try putting baking soda paste onto your skin. Stir water into baking soda until it gets like a paste. Try putting on a lotion that relieves itchiness (calamine lotion). Keep cool and out of the sun. Sweating and being hot can make itching worse. General instructions  Rest as needed. Drink enough fluid to keep your pee (urine) pale yellow. Wear loose-fitting clothing. Avoid scented soaps, detergents, and perfumes. Use gentle soaps, detergents, perfumes, and other cosmetic products. Avoid anything that causes your rash. Keep a journal to help track what causes your rash. Write  down: What you eat. What cosmetic products you use. What you drink. What you wear. This includes jewelry. Keep all follow-up visits as told by your doctor. This is important.  Contact a doctor if: You sweat at night. You lose weight. You pee (urinate) more than normal. You pee less than normal, or you notice that your pee is a darker color than normal. You feel weak. You throw up (vomit). Your skin or the whites of your eyes look yellow (jaundice). Your skin: Tingles. Is numb. Your rash: Does not go away after a few days. Gets worse. You are: More thirsty than normal. More tired than normal. You have: New symptoms. Pain in your belly (abdomen). A fever. Watery poop (diarrhea). Get help right away if: You have a fever and your symptoms suddenly get worse. You start to feel mixed up (confused). You have a very bad headache or a stiff neck. You have very bad joint pains or stiffness. You have jerky movements that you cannot control (seizure). Your rash covers all or most of your body. The rash may or may not be painful. You have blisters that: Are on top of the rash. Grow larger. Grow together. Are painful. Are inside your nose or mouth. You have a rash that: Looks like purple pinprick-sized spots all over your body. Has a "bull's eye" or looks like a target. Is red and painful, causes your skin to peel, and is not from being in the sun too long. Summary A rash is a change in the color of your skin. A rash can also change the way your skin feels.   The goal of treatment is to stop the itching and keep the rash from spreading. Take or apply over-the-counter and prescription medicines only as told by your doctor. Contact a doctor if you have new symptoms or symptoms that get worse. Keep all follow-up visits as told by your doctor. This is important. This information is not intended to replace advice given to you by your health care provider. Make sure you discuss any  questions you have with your healthcare provider. Document Revised: 05/20/2018 Document Reviewed: 08/30/2017 Elsevier Patient Education  2022 [employer]  

## 2020-11-18 NOTE — Progress Notes (Signed)
Acute Office Visit  Subjective:    Patient ID: Janet Gardner, female    DOB: 03/22/1955, 65 y.o.   MRN: [id number]  Chief Complaint  Patient presents with  . Rash    Rash This is a new problem. The current episode started in the past 7 days. The problem has been rapidly improving since onset. The affected locations include the face. The rash is characterized by dryness and redness. She was exposed to nothing. Pertinent negatives include no congestion, cough, fatigue, fever, shortness of breath or sore throat. Past treatments include antibiotic cream. The treatment provided moderate relief. There is no history of allergies, asthma or eczema.    Past Medical History:  Diagnosis Date  . B12 deficiency    monthly injection  . Fibromyalgia   . GERD (gastroesophageal reflux disease)   . Hypothyroidism   . Osteopenia 06/11/2020    Past Surgical History:  Procedure Laterality Date  . ABDOMINAL HYSTERECTOMY    . CHOLECYSTECTOMY    . colonoscopy  2007   Dr. Lavone Neri, incomplete due to poor prep. scope passed to transverse colon. ACBE normal.  . ESOPHAGOGASTRODUODENOSCOPY  07/22/2009   [id number] apperaring esophagus s/p 56 dilator/small HH/large ulcerated gastric polyp in the prepyloric antral area. Inflammatory fibroid polyp.  . ESOPHAGOGASTRODUODENOSCOPY (EGD) WITH ESOPHAGEAL DILATION N/A 11/24/2012   Procedure: ESOPHAGOGASTRODUODENOSCOPY (EGD) WITH ESOPHAGEAL DILATION;  Surgeon: Daneil Dolin, MD;  [facility];  Service: Endoscopy;  Laterality: N/A;  9:30AM  . FOOT SURGERY     Right  . KIDNEY SURGERY      Family History  Problem Relation Age of Onset  . Breast cancer Mother   . Heart disease Father   . Colon cancer Neg Hx     Social History   Socioeconomic History  . Marital status: Married    Spouse name: Not on file  . Number of children: 1  . Years of education: Not on file  . Highest education level: Not on file  Occupational History  . Occupation:  retired    Comment: retired  Tobacco Use  . Smoking status: Never  . Smokeless tobacco: Never  Vaping Use  . Vaping Use: Never used  Substance and Sexual Activity  . Alcohol use: No  . Drug use: No  . Sexual activity: Not on file  Other Topics Concern  . Not on file  Social History Narrative   Lives with her husband and her 7 year old granddaughter. Her daughter lives in [place] Determinants of Health   Financial Resource Strain: Low Risk   . Difficulty of Paying Living Expenses: Not hard at all  Food Insecurity: No Food Insecurity  . Worried About Charity fundraiser in the Last Year: Never true  . Ran Out of Food in the Last Year: Never true  Transportation Needs: No Transportation Needs  . Lack of Transportation (Medical): No  . Lack of Transportation (Non-Medical): No  Physical Activity: Sufficiently Active  . Days of Exercise per Week: 7 days  . Minutes of Exercise per Session: 30 min  Stress: No Stress Concern Present  . Feeling of Stress : Not at all  Social Connections: Socially Integrated  . Frequency of Communication with Friends and Family: More than three times a week  . Frequency of Social Gatherings with Friends and Family: Once a week  . Attends Religious Services: More than 4 times per year  . Active Member of Clubs or Organizations: Yes  .  Attends Archivist Meetings: More than 4 times per year  . Marital Status: Married  Human resources officer Violence: Not At Risk  . Fear of Current or Ex-Partner: No  . Emotionally Abused: No  . Physically Abused: No  . Sexually Abused: No    Outpatient Medications Prior to Visit  Medication Sig Dispense Refill  . albuterol (VENTOLIN HFA) 108 (90 Base) MCG/ACT inhaler Inhale 2 puffs into the lungs every 6 (six) hours as needed for wheezing or shortness of breath. 18 g 1  . DULoxetine (CYMBALTA) 30 MG capsule Take 1 capsule (30 mg total) by mouth daily. 90 capsule 1  . levothyroxine (SYNTHROID) 125 MCG  tablet TAKE 1 TABLET (125 MCG TOTAL) BY MOUTH DAILY BEFORE BREAKFAST. 90 tablet 3  . lisinopril (ZESTRIL) 10 MG tablet Take 1 tablet (10 mg total) by mouth daily. 90 tablet 1  . Vitamin D, Ergocalciferol, (DRISDOL) 1.25 MG (50000 UNIT) CAPS capsule Take 1 capsule (50,000 Units total) by mouth every 7 (seven) days. 12 capsule 1   No facility-administered medications prior to visit.    Allergies  Allergen Reactions  . Azithromycin Other (See Comments)    Caused hematuria.  [place] [Levofloxacin] Nausea And Vomiting  . Sulfa Antibiotics Hives    Vomiting    Review of Systems  Constitutional:  Negative for fatigue and fever.  HENT:  Negative for congestion and sore throat.   Respiratory:  Negative for cough and shortness of breath.   Skin:  Positive for rash.  All other systems reviewed and are negative.     Objective:    Physical Exam Vitals and nursing note reviewed.  Constitutional:      Appearance: Normal appearance.  HENT:     Head: Normocephalic.     Nose: Nose normal.  Cardiovascular:     Rate and Rhythm: Normal rate.     Pulses: Normal pulses.     Heart sounds: Normal heart sounds.  Pulmonary:     Effort: Pulmonary effort is normal.     Breath sounds: Normal breath sounds.  Abdominal:     General: Bowel sounds are normal.  Skin:    General: Skin is dry.     Findings: Erythema and rash present.  Neurological:     Mental Status: She is alert and oriented to person, place, and time.  Psychiatric:        Behavior: Behavior normal.    BP (!) 151/80   Pulse 82   Temp 97.6 F (36.4 C) (Temporal)   Ht 5' 9" (1.753 m)   Wt 208 lb (94.3 kg)   BMI 30.72 kg/m  Wt Readings from Last 3 Encounters:  11/18/20 208 lb (94.3 kg)  10/03/20 208 lb (94.3 kg)  09/04/20 211 lb 12.8 oz (96.1 kg)    Health Maintenance Due  Topic Date Due  . COVID-19 Vaccine (4 - Booster for Moderna series) 07/03/2020    There are no preventive care reminders to display for this  patient.   Lab Results  Component Value Date   TSH 0.870 06/11/2020   Lab Results  Component Value Date   WBC 4.5 06/11/2020   HGB 12.7 06/11/2020   HCT 38.7 06/11/2020   MCV 83 06/11/2020   PLT 234 06/11/2020   Lab Results  Component Value Date   NA 143 06/11/2020   K 4.6 06/11/2020   CO2 22 06/11/2020   GLUCOSE 101 (H) 06/11/2020   BUN 14 06/11/2020   CREATININE 0.61 06/11/2020  BILITOT 0.3 06/11/2020   ALKPHOS 84 06/11/2020   AST 13 06/11/2020   ALT 13 06/11/2020   PROT 7.2 06/11/2020   ALBUMIN 3.9 06/11/2020   CALCIUM 8.8 06/11/2020   EGFR 98 06/11/2020   Lab Results  Component Value Date   CHOL 169 06/11/2020   Lab Results  Component Value Date   HDL 46 06/11/2020   Lab Results  Component Value Date   LDLCALC 111 (H) 06/11/2020   Lab Results  Component Value Date   TRIG 60 06/11/2020   Lab Results  Component Value Date   CHOLHDL 3.7 06/11/2020   No results found for: HGBA1C     Assessment & Plan:   Problem List Items Addressed This Visit       Musculoskeletal and Integument   Rash - Primary    -Facial and inner thigh rash gradually improving. -Patient is not willing to use prednisone taper at this time. -Started Cymbalta in the past 6 weeks and rash may be a medication reaction. -Advised to watch for repeat reaction.  -No changes to current cosmetic products or new diets. -Triamcinolone cream topical 0.1% Rx sent to pharmacy.  Education -provided to patient printed handouts given.  Patient knows to follow-up with unresolved symptoms.      Relevant Medications   triamcinolone cream (KENALOG) 0.1 %     Meds ordered this encounter  Medications  . triamcinolone cream (KENALOG) 0.1 %    Sig: Apply 1 application topically 2 (two) times daily.    Dispense:  30 g    Refill:  0    Order Specific Question:   Supervising Provider    Answer:   Janora Norlander [[id number]]     Ivy Lynn, NP

## 2020-11-18 NOTE — Assessment & Plan Note (Signed)
-  Facial and inner thigh rash gradually improving. -Patient is not willing to use prednisone taper at this time. -Started Cymbalta in the past 6 weeks and rash may be a medication reaction. -Advised to watch for repeat reaction.  -No changes to current cosmetic products or new diets. -Triamcinolone cream topical 0.1% Rx sent to pharmacy.  Education -provided to patient printed handouts given.  Patient knows to follow-up with unresolved symptoms.

## 2020-11-19 ENCOUNTER — Encounter: Payer: Self-pay | Admitting: Family Medicine

## 2020-11-21 ENCOUNTER — Encounter: Payer: Self-pay | Admitting: Family Medicine

## 2020-11-21 ENCOUNTER — Ambulatory Visit (INDEPENDENT_AMBULATORY_CARE_PROVIDER_SITE_OTHER): Payer: Medicare HMO | Admitting: Family Medicine

## 2020-11-21 DIAGNOSIS — J069 Acute upper respiratory infection, unspecified: Secondary | ICD-10-CM

## 2020-11-21 NOTE — Progress Notes (Signed)
Virtual Visit via Telephone Note  I connected with Janet Gardner on 11/21/20 at 4:30 PM by telephone and verified that I am speaking with the correct person using two identifiers. Janet Gardner is currently located at home and her granddaughter is currently with her during this visit. The provider, Gwenlyn Fudge, FNP is located in their office at time of visit.  I discussed the limitations, risks, security and privacy concerns of performing an evaluation and management service by telephone and the availability of in person appointments. I also discussed with the patient that there may be a patient responsible charge related to this service. The patient expressed understanding and agreed to proceed.  Subjective: PCP: Gwenlyn Fudge, FNP  Chief Complaint  Patient presents with   Sinusitis   Patient complains of cough, head congestion, sneezing, ear pain/pressure, facial pain/pressure, postnasal drainage, and wheezing. Onset of symptoms was 6 days ago, gradually improving since that time. States she feels great now.  She is drinking plenty of fluids. Evaluation to date: none. Treatment to date:  OTC sinus medicine . She does smoke.    ROS: Per HPI  Current Outpatient Medications:    albuterol (VENTOLIN HFA) 108 (90 Base) MCG/ACT inhaler, Inhale 2 puffs into the lungs every 6 (six) hours as needed for wheezing or shortness of breath., Disp: 18 g, Rfl: 1   DULoxetine (CYMBALTA) 30 MG capsule, Take 1 capsule (30 mg total) by mouth daily., Disp: 90 capsule, Rfl: 1   levothyroxine (SYNTHROID) 125 MCG tablet, TAKE 1 TABLET (125 MCG TOTAL) BY MOUTH DAILY BEFORE BREAKFAST., Disp: 90 tablet, Rfl: 3   lisinopril (ZESTRIL) 10 MG tablet, Take 1 tablet (10 mg total) by mouth daily., Disp: 90 tablet, Rfl: 1   triamcinolone cream (KENALOG) 0.1 %, Apply 1 application topically 2 (two) times daily., Disp: 30 g, Rfl: 0   Vitamin D, Ergocalciferol, (DRISDOL) 1.25 MG (50000 UNIT) CAPS capsule, Take 1 capsule  (50,000 Units total) by mouth every 7 (seven) days., Disp: 12 capsule, Rfl: 1  Allergies  Allergen Reactions   Azithromycin Other (See Comments)    Caused hematuria.   Levaquin [Levofloxacin] Nausea And Vomiting   Sulfa Antibiotics Hives    Vomiting   Past Medical History:  Diagnosis Date   B12 deficiency    monthly injection   Fibromyalgia    GERD (gastroesophageal reflux disease)    Hypothyroidism    Osteopenia 06/11/2020    Observations/Objective: A&O  No respiratory distress or wheezing audible over the phone Mood, judgement, and thought processes all WNL  Assessment and Plan: 1. Viral URI Continue symptom management.   Follow Up Instructions:  I discussed the assessment and treatment plan with the patient. The patient was provided an opportunity to ask questions and all were answered. The patient agreed with the plan and demonstrated an understanding of the instructions.   The patient was advised to call back or seek an in-person evaluation if the symptoms worsen or if the condition fails to improve as anticipated.  The above assessment and management plan was discussed with the patient. The patient verbalized understanding of and has agreed to the management plan. Patient is aware to call the clinic if symptoms persist or worsen. Patient is aware when to return to the clinic for a follow-up visit. Patient educated on when it is appropriate to go to the emergency department.   Time call ended: 4:41 PM  I provided 11 minutes of non-face-to-face time during this encounter.  Deliah Boston, MSN, APRN, FNP-C [facility] 11/21/20

## 2020-11-25 ENCOUNTER — Encounter: Payer: Self-pay | Admitting: Family Medicine

## 2020-11-25 MED ORDER — METHYLPREDNISOLONE 4 MG PO TBPK
ORAL_TABLET | ORAL | 0 refills | Status: DC
Start: 2020-11-25 — End: 2021-01-07

## 2020-12-03 ENCOUNTER — Encounter: Payer: Self-pay | Admitting: Family Medicine

## 2021-01-07 ENCOUNTER — Ambulatory Visit (INDEPENDENT_AMBULATORY_CARE_PROVIDER_SITE_OTHER): Payer: Medicare HMO | Admitting: Family Medicine

## 2021-01-07 ENCOUNTER — Encounter: Payer: Self-pay | Admitting: Family Medicine

## 2021-01-07 VITALS — BP 148/73 | HR 66 | Temp 96.4°F | Ht 69.0 in | Wt 212.0 lb

## 2021-01-07 DIAGNOSIS — M797 Fibromyalgia: Secondary | ICD-10-CM

## 2021-01-07 DIAGNOSIS — I10 Essential (primary) hypertension: Secondary | ICD-10-CM | POA: Diagnosis not present

## 2021-01-07 DIAGNOSIS — E538 Deficiency of other specified B group vitamins: Secondary | ICD-10-CM

## 2021-01-07 DIAGNOSIS — M5432 Sciatica, left side: Secondary | ICD-10-CM | POA: Diagnosis not present

## 2021-01-07 DIAGNOSIS — E039 Hypothyroidism, unspecified: Secondary | ICD-10-CM

## 2021-01-07 DIAGNOSIS — E785 Hyperlipidemia, unspecified: Secondary | ICD-10-CM | POA: Diagnosis not present

## 2021-01-07 DIAGNOSIS — E559 Vitamin D deficiency, unspecified: Secondary | ICD-10-CM

## 2021-01-07 MED ORDER — METHYLPREDNISOLONE ACETATE 80 MG/ML IJ SUSP
80.0000 mg | Freq: Once | INTRAMUSCULAR | Status: AC
  Administered 2021-01-07: 80 mg via INTRAMUSCULAR

## 2021-01-07 MED ORDER — LISINOPRIL 20 MG PO TABS: 20.0000 mg | ORAL_TABLET | Freq: Every day | ORAL | 2 refills | Status: DC

## 2021-01-07 NOTE — Progress Notes (Signed)
Assessment & Plan:  1. Essential hypertension Improving. Lisinopril increased from 10 mg to 20 mg daily. Patient to monitor BP at home, keep a log, and bring it back with her to her next appointment. - CBC with Differential/Platelet - CMP14+EGFR - Lipid panel - lisinopril (ZESTRIL) 20 MG tablet; Take 1 tablet (20 mg total) by mouth daily.  Dispense: 30 tablet; Refill: 2  2. Acquired hypothyroidism Well controlled on current regimen.  - TSH - T4, free  3. Dyslipidemia Labs to assess. - Lipid panel  4. Fibromyalgia Well controlled on current regimen.  - CMP14+EGFR  5. Vitamin D deficiency Labs to assess. - VITAMIN D 25 Hydroxy (Vit-D Deficiency, Fractures)  6. Vitamin B12 deficiency Labs to assess. - Vitamin B12  7. Left sided sciatica - methylPREDNISolone acetate (DEPO-MEDROL) injection 80 mg   Return in about 6 weeks (around 02/18/2021) for HTN.  Hendricks Limes, MSN, APRN, FNP-C [facility]  Subjective:    Patient ID: Janet Gardner, female    DOB: December 19, 1955, 65 y.o.   MRN: [id number]  Patient Care Team: Loman Brooklyn, FNP as PCP - General (Family Medicine) Gala Romney Cristopher Estimable, MD as Consulting Physician (Gastroenterology)   Chief Complaint:  Chief Complaint  Patient presents with   Hypertension   Hypothyroidism    3 month follow up of chronic medical conditions    Hip Pain    Left x 2 weeks     HPI: Janet Gardner is a 65 y.o. female presenting on 01/07/2021 for Hypertension, Hypothyroidism (3 month follow up of chronic medical conditions ), and Hip Pain (Left x 2 weeks )  Hypertension: on Lisinopril 10 mg daily, but has not yet taken her medications this morning. She does monitor her blood pressure at home and reports readings 138-140s/70s.   Hypothyroidism: taking levothyroxine 125 mcg daily.  Fibromyalgia: doing well with Cymbalta.  Vitamin D deficiency: not taking prescribed once weekly vitamin D 50,000 units. Last vitamin D  level low at 25.5 on 06/11/2020.   Vitamin B-12 deficiency: previously was getting injections and was advised after lab work in May to change to an OTC supplement, which she did not do.  New complaints: Patient reports left sided sciatica that has been acting up x2 weeks. She is currently unable to sleep on her left side, which is her favorite sleeping side. She describes the pain as a constant throbbing and rates it 7/10. Nothing makes the pain better or worse.    Social history:  Relevant past medical, surgical, family and social history reviewed and updated as indicated. Interim medical history since our last visit reviewed.  Allergies and medications reviewed and updated.  DATA REVIEWED: CHART IN EPIC  ROS: Negative unless specifically indicated above in HPI.    Current Outpatient Medications:    albuterol (VENTOLIN HFA) 108 (90 Base) MCG/ACT inhaler, Inhale 2 puffs into the lungs every 6 (six) hours as needed for wheezing or shortness of breath., Disp: 18 g, Rfl: 1   DULoxetine (CYMBALTA) 30 MG capsule, Take 1 capsule (30 mg total) by mouth daily., Disp: 90 capsule, Rfl: 1   levothyroxine (SYNTHROID) 125 MCG tablet, TAKE 1 TABLET (125 MCG TOTAL) BY MOUTH DAILY BEFORE BREAKFAST., Disp: 90 tablet, Rfl: 3   lisinopril (ZESTRIL) 10 MG tablet, Take 1 tablet (10 mg total) by mouth daily., Disp: 90 tablet, Rfl: 1   Vitamin D, Ergocalciferol, (DRISDOL) 1.25 MG (50000 UNIT) CAPS capsule, Take 1 capsule (50,000 Units total) by mouth every  7 (seven) days., Disp: 12 capsule, Rfl: 1   Allergies  Allergen Reactions   Azithromycin Other (See Comments)    Caused hematuria.   Levaquin [Levofloxacin] Nausea And Vomiting   Sulfa Antibiotics Hives    Vomiting   Past Medical History:  Diagnosis Date   B12 deficiency    monthly injection   Fibromyalgia    GERD (gastroesophageal reflux disease)    Hypothyroidism    Osteopenia 06/11/2020    Past Surgical History:  Procedure Laterality Date    ABDOMINAL HYSTERECTOMY     CHOLECYSTECTOMY     colonoscopy  2007   Dr. Lavone Neri, incomplete due to poor prep. scope passed to transverse colon. ACBE normal.   ESOPHAGOGASTRODUODENOSCOPY  07/22/2009   [id number] apperaring esophagus s/p 56 dilator/small HH/large ulcerated gastric polyp in the prepyloric antral area. Inflammatory fibroid polyp.   ESOPHAGOGASTRODUODENOSCOPY (EGD) WITH ESOPHAGEAL DILATION N/A 11/24/2012   Procedure: ESOPHAGOGASTRODUODENOSCOPY (EGD) WITH ESOPHAGEAL DILATION;  Surgeon: Daneil Dolin, MD;  Location: [facility];  Service: Endoscopy;  Laterality: N/A;  9:30AM   FOOT SURGERY     Right   KIDNEY SURGERY      Social History   Socioeconomic History   Marital status: Married    Spouse name: Not on file   Number of children: 1   Years of education: Not on file   Highest education level: Not on file  Occupational History   Occupation: retired    Comment: retired  Tobacco Use   Smoking status: Never   Smokeless tobacco: Never  Scientific laboratory technician Use: Never used  Substance and Sexual Activity   Alcohol use: No   Drug use: No   Sexual activity: Not on file  Other Topics Concern   Not on file  Social History Narrative   Lives with her husband and her 40 year old granddaughter. Her daughter lives in [place] Resource Strain: Low Risk    Difficulty of Paying Living Expenses: Not hard at all  Food Insecurity: No Food Insecurity   Worried About Charity fundraiser in the Last Year: Never true   Arboriculturist in the Last Year: Never true  Transportation Needs: No Transportation Needs   Lack of Transportation (Medical): No   Lack of Transportation (Non-Medical): No  Physical Activity: Sufficiently Active   Days of Exercise per Week: 7 days   Minutes of Exercise per Session: 30 min  Stress: No Stress Concern Present   Feeling of Stress : Not at all  Social Connections: Socially Integrated   Frequency of  Communication with Friends and Family: More than three times a week   Frequency of Social Gatherings with Friends and Family: Once a week   Attends Religious Services: More than 4 times per year   Active Member of [employer] or Organizations: Yes   Attends Music therapist: More than 4 times per year   Marital Status: Married  Human resources officer Violence: Not At Risk   Fear of Current or Ex-Partner: No   Emotionally Abused: No   Physically Abused: No   Sexually Abused: No        Objective:    BP (!) 148/73   Pulse 66   Temp (!) 96.4 F (35.8 C) (Temporal)   Ht '5\' 9"'  (1.753 m)   Wt 212 lb (96.2 kg)   SpO2 99%   BMI 31.31 kg/m   Wt Readings from Last 3  Encounters:  01/07/21 212 lb (96.2 kg)  11/18/20 208 lb (94.3 kg)  10/03/20 208 lb (94.3 kg)    Physical Exam Vitals reviewed.  Constitutional:      General: She is not in acute distress.    Appearance: Normal appearance. She is obese. She is not ill-appearing, toxic-appearing or diaphoretic.  HENT:     Head: Normocephalic and atraumatic.  Eyes:     General: No scleral icterus.       Right eye: No discharge.        Left eye: No discharge.     Conjunctiva/sclera: Conjunctivae normal.  Cardiovascular:     Rate and Rhythm: Normal rate and regular rhythm.     Heart sounds: Normal heart sounds. No murmur heard.   No friction rub. No gallop.  Pulmonary:     Effort: Pulmonary effort is normal. No respiratory distress.     Breath sounds: Normal breath sounds. No stridor. No wheezing, rhonchi or rales.  Musculoskeletal:        General: Normal range of motion.     Cervical back: Normal range of motion.  Skin:    General: Skin is warm and dry.     Capillary Refill: Capillary refill takes less than 2 seconds.  Neurological:     General: No focal deficit present.     Mental Status: She is alert and oriented to person, place, and time. Mental status is at baseline.  Psychiatric:        Mood and Affect: Mood normal.         Behavior: Behavior normal.        Thought Content: Thought content normal.        Judgment: Judgment normal.    Lab Results  Component Value Date   TSH 0.870 06/11/2020   Lab Results  Component Value Date   WBC 4.5 06/11/2020   HGB 12.7 06/11/2020   HCT 38.7 06/11/2020   MCV 83 06/11/2020   PLT 234 06/11/2020   Lab Results  Component Value Date   NA 143 06/11/2020   K 4.6 06/11/2020   CO2 22 06/11/2020   GLUCOSE 101 (H) 06/11/2020   BUN 14 06/11/2020   CREATININE 0.61 06/11/2020   BILITOT 0.3 06/11/2020   ALKPHOS 84 06/11/2020   AST 13 06/11/2020   ALT 13 06/11/2020   PROT 7.2 06/11/2020   ALBUMIN 3.9 06/11/2020   CALCIUM 8.8 06/11/2020   EGFR 98 06/11/2020   Lab Results  Component Value Date   CHOL 169 06/11/2020   Lab Results  Component Value Date   HDL 46 06/11/2020   Lab Results  Component Value Date   LDLCALC 111 (H) 06/11/2020   Lab Results  Component Value Date   TRIG 60 06/11/2020   Lab Results  Component Value Date   CHOLHDL 3.7 06/11/2020   No results found for: HGBA1C

## 2021-01-08 ENCOUNTER — Encounter: Payer: Self-pay | Admitting: Family Medicine

## 2021-01-08 LAB — CBC WITH DIFFERENTIAL/PLATELET
Basophils Absolute: 0.1 10*3/uL (ref 0.0–0.2)
Basos: 1 %
EOS (ABSOLUTE): 0.2 10*3/uL (ref 0.0–0.4)
Eos: 4 %
Hematocrit: 39.7 % (ref 34.0–46.6)
Hemoglobin: 13 g/dL (ref 11.1–15.9)
Immature Grans (Abs): 0 10*3/uL (ref 0.0–0.1)
Immature Granulocytes: 0 %
Lymphocytes Absolute: 1.2 10*3/uL (ref 0.7–3.1)
Lymphs: 27 %
MCH: 27.3 pg (ref 26.6–33.0)
MCHC: 32.7 g/dL (ref 31.5–35.7)
MCV: 83 fL (ref 79–97)
Monocytes Absolute: 0.3 10*3/uL (ref 0.1–0.9)
Monocytes: 7 %
Neutrophils Absolute: 2.8 10*3/uL (ref 1.4–7.0)
Neutrophils: 61 %
Platelets: 246 10*3/uL (ref 150–450)
RBC: 4.76 x10E6/uL (ref 3.77–5.28)
RDW: 13.7 % (ref 11.7–15.4)
WBC: 4.6 10*3/uL (ref 3.4–10.8)

## 2021-01-08 LAB — CMP14+EGFR
ALT: 13 IU/L (ref 0–32)
AST: 18 IU/L (ref 0–40)
Albumin/Globulin Ratio: 1.2 (ref 1.2–2.2)
Albumin: 3.8 g/dL (ref 3.8–4.8)
Alkaline Phosphatase: 88 IU/L (ref 44–121)
BUN/Creatinine Ratio: 22 (ref 12–28)
BUN: 15 mg/dL (ref 8–27)
Bilirubin Total: 0.4 mg/dL (ref 0.0–1.2)
CO2: 24 mmol/L (ref 20–29)
Calcium: 8.8 mg/dL (ref 8.7–10.3)
Chloride: 102 mmol/L (ref 96–106)
Creatinine, Ser: 0.67 mg/dL (ref 0.57–1.00)
Globulin, Total: 3.2 g/dL (ref 1.5–4.5)
Glucose: 92 mg/dL (ref 70–99)
Potassium: 4.4 mmol/L (ref 3.5–5.2)
Sodium: 137 mmol/L (ref 134–144)
Total Protein: 7 g/dL (ref 6.0–8.5)
eGFR: 96 mL/min/{1.73_m2} (ref >59)

## 2021-01-08 LAB — VITAMIN D 25 HYDROXY (VIT D DEFICIENCY, FRACTURES): Vit D, 25-Hydroxy: 26.3 ng/mL — ABNORMAL LOW (ref 30.0–100.0)

## 2021-01-08 LAB — LIPID PANEL
Chol/HDL Ratio: 3.7 ratio (ref 0.0–4.4)
Cholesterol, Total: 181 mg/dL (ref 100–199)
HDL: 49 mg/dL (ref >39)
LDL Chol Calc (NIH): 118 mg/dL — ABNORMAL HIGH (ref 0–99)
Triglycerides: 77 mg/dL (ref 0–149)
VLDL Cholesterol Cal: 14 mg/dL (ref 5–40)

## 2021-01-08 LAB — TSH: TSH: 0.677 u[IU]/mL (ref 0.450–4.500)

## 2021-01-08 LAB — T4, FREE: Free T4: 1.72 ng/dL (ref 0.82–1.77)

## 2021-01-08 LAB — VITAMIN B12: Vitamin B-12: 220 pg/mL — ABNORMAL LOW (ref 232–1245)

## 2021-01-13 ENCOUNTER — Encounter: Payer: Self-pay | Admitting: Family Medicine

## 2021-01-13 DIAGNOSIS — E785 Hyperlipidemia, unspecified: Secondary | ICD-10-CM

## 2021-01-15 MED ORDER — LOVASTATIN 40 MG PO TABS: 40.0000 mg | ORAL_TABLET | Freq: Every day | ORAL | 2 refills | Status: DC

## 2021-01-22 ENCOUNTER — Encounter: Payer: Self-pay | Admitting: Family Medicine

## 2021-01-29 ENCOUNTER — Encounter: Payer: Self-pay | Admitting: Family Medicine

## 2021-04-09 ENCOUNTER — Encounter: Payer: Self-pay | Admitting: Family Medicine

## 2021-04-10 ENCOUNTER — Ambulatory Visit (INDEPENDENT_AMBULATORY_CARE_PROVIDER_SITE_OTHER): Payer: Medicare HMO | Admitting: Family Medicine

## 2021-04-10 ENCOUNTER — Encounter: Payer: Self-pay | Admitting: Family Medicine

## 2021-04-10 VITALS — BP 134/81 | HR 83 | Temp 98.1°F | Ht 69.0 in | Wt 220.2 lb

## 2021-04-10 DIAGNOSIS — K644 Residual hemorrhoidal skin tags: Secondary | ICD-10-CM | POA: Diagnosis not present

## 2021-04-10 DIAGNOSIS — J309 Allergic rhinitis, unspecified: Secondary | ICD-10-CM | POA: Diagnosis not present

## 2021-04-10 NOTE — Progress Notes (Signed)
? ?Assessment & Plan:  ?1. Allergic shiners ?Encouraged daily antihistamine and cool compresses. ? ?2. External hemorrhoids ?Patient declines any treatment from me since they are improving with OTC hemorrhoid cream. Education provided on hemorrhoids. ? ? ?Follow up plan: Return in about 3 months (around 07/11/2021) for annual physical. ? ?Hendricks Limes, MSN, APRN, FNP-C ?[place] ? ?Subjective:  ? ?Patient ID: Janet Gardner, female    DOB: October 07, 1955, 66 y.o.   MRN: [id number] ? ?HPI: ?Janet Gardner is a 66 y.o. female presenting on 04/10/2021 for medication managemnt (Per pt med follow up. Per pt believe she may have hemorrhoids? ) ? ?Patient reports eye redness started two weeks ago and resolved two days later. She is worried it may be related to her Cymbalta. She has been on Cymbalta for a long time.  ? ?Patient reports hemorrhoids that are improving with hemorrhoid cream. Denies constipation. ? ? ?ROS: Negative unless specifically indicated above in HPI.  ? ?Relevant past medical history reviewed and updated as indicated.  ? ?Allergies and medications reviewed and updated. ? ? ?Current Outpatient Medications:  ?  albuterol (VENTOLIN HFA) 108 (90 Base) MCG/ACT inhaler, Inhale 2 puffs into the lungs every 6 (six) hours as needed for wheezing or shortness of breath., Disp: 18 g, Rfl: 1 ?  DULoxetine (CYMBALTA) 30 MG capsule, Take 1 capsule (30 mg total) by mouth daily. (Patient taking differently: Take 30 mg by mouth at bedtime.), Disp: 90 capsule, Rfl: 1 ?  levothyroxine (SYNTHROID) 125 MCG tablet, TAKE 1 TABLET (125 MCG TOTAL) BY MOUTH DAILY BEFORE BREAKFAST., Disp: 90 tablet, Rfl: 3 ?  lisinopril (ZESTRIL) 20 MG tablet, Take 1 tablet (20 mg total) by mouth daily., Disp: 30 tablet, Rfl: 2 ?  lovastatin (MEVACOR) 40 MG tablet, Take 1 tablet (40 mg total) by mouth at bedtime., Disp: 30 tablet, Rfl: 2 ? ?Allergies  ?Allergen Reactions  ? Azithromycin Other (See Comments)  ?  Caused hematuria.  ?  Levaquin [Levofloxacin] Nausea And Vomiting  ? Sulfa Antibiotics Hives  ?  Vomiting  ? ? ?Objective:  ? ?BP 134/81   Pulse 83   Temp 98.1 ?F (36.7 ?C) (Temporal)   Ht 5\' 9"  (1.753 m)   Wt 220 lb 3.2 oz (99.9 kg)   SpO2 97%   BMI 32.52 kg/m?   ? ?Physical Exam ?Vitals reviewed.  ?Constitutional:   ?   General: She is not in acute distress. ?   Appearance: Normal appearance. She is not ill-appearing, toxic-appearing or diaphoretic.  ?HENT:  ?   Head: Normocephalic and atraumatic.  ?Eyes:  ?   General: Allergic shiner present. No scleral icterus.    ?   Right eye: No discharge.     ?   Left eye: No discharge.  ?   Conjunctiva/sclera: Conjunctivae normal.  ?Cardiovascular:  ?   Rate and Rhythm: Normal rate and regular rhythm.  ?   Heart sounds: Normal heart sounds. No murmur heard. ?  No friction rub. No gallop.  ?Pulmonary:  ?   Effort: Pulmonary effort is normal. No respiratory distress.  ?   Breath sounds: Normal breath sounds. No stridor. No wheezing, rhonchi or rales.  ?Musculoskeletal:     ?   General: Normal range of motion.  ?   Cervical back: Normal range of motion.  ?Skin: ?   General: Skin is warm and dry.  ?   Capillary Refill: Capillary refill takes less than 2 seconds.  ?Neurological:  ?  General: No focal deficit present.  ?   Mental Status: She is alert and oriented to person, place, and time. Mental status is at baseline.  ?Psychiatric:     ?   Mood and Affect: Mood normal.     ?   Behavior: Behavior normal.     ?   Thought Content: Thought content normal.     ?   Judgment: Judgment normal.  ? ? ? ? ? ? ?

## 2021-06-05 ENCOUNTER — Ambulatory Visit (INDEPENDENT_AMBULATORY_CARE_PROVIDER_SITE_OTHER): Payer: Medicare HMO

## 2021-06-05 VITALS — Wt 215.0 lb

## 2021-06-05 DIAGNOSIS — Z0000 Encounter for general adult medical examination without abnormal findings: Secondary | ICD-10-CM

## 2021-06-05 DIAGNOSIS — Z1231 Encounter for screening mammogram for malignant neoplasm of breast: Secondary | ICD-10-CM | POA: Diagnosis not present

## 2021-06-05 NOTE — Patient Instructions (Signed)
Janet Gardner , ?Thank you for taking time to come for your Medicare Wellness Visit. I appreciate your ongoing commitment to your health goals. Please review the following plan we discussed and let me know if I can assist you in the future.  ? ?Screening recommendations/referrals: ?Colonoscopy: Cologuard done 06/25/2020- repeat in 3 years ?Mammogram: Done 06/03/2020 - Make appointment soon with [facility] ?Bone Density: Done 06/14/2020 - Repeat every 2 years ?Recommended yearly ophthalmology/optometry visit for glaucoma screening and checkup ?Recommended yearly dental visit for hygiene and checkup ? ?Vaccinations: ?Influenza vaccine: Done 11/11/2020 - Repeat annually ?Pneumococcal vaccine: one 09/02/2018 - get Pneumovax-23 next ?Tdap vaccine: Done 07/15/2015 - Repeat in 10 years ?Shingles vaccine: Due - Shingrix is 2 doses 2-6 months apart and over 90% effective     ?Covid-19: Done 04/06/2019, 05/05/2019, & 03/05/2020 ? ?Advanced directives: Advance directive discussed with you today. Even though you declined this today, please call our office should you change your mind, and we can give you the proper paperwork for you to fill out.  ? ?Conditions/risks identified: Keep up the great work! Aim for 30 minutes of exercise or brisk walking, 6-8 glasses of water, and 5 servings of fruits and vegetables each day.  ? ?Next appointment: Follow up in one year for your annual wellness visit  ? ? ?Preventive Care 98 Years and Older, Female ?Preventive care refers to lifestyle choices and visits with your health care provider that can promote health and wellness. ?What does preventive care include? ?A yearly physical exam. This is also called an annual well check. ?Dental exams once or twice a year. ?Routine eye exams. Ask your health care provider how often you should have your eyes checked. ?Personal lifestyle choices, including: ?Daily care of your teeth and gums. ?Regular physical activity. ?Eating a healthy diet. ?Avoiding tobacco and  drug use. ?Limiting alcohol use. ?Practicing safe sex. ?Taking low-dose aspirin every day. ?Taking vitamin and mineral supplements as recommended by your health care provider. ?What happens during an annual well check? ?The services and screenings done by your health care provider during your annual well check will depend on your age, overall health, lifestyle risk factors, and family history of disease. ?Counseling  ?Your health care provider may ask you questions about your: ?Alcohol use. ?Tobacco use. ?Drug use. ?Emotional well-being. ?Home and relationship well-being. ?Sexual activity. ?Eating habits. ?History of falls. ?Memory and ability to understand (cognition). ?Work and work Astronomer. ?Reproductive health. ?Screening  ?You may have the following tests or measurements: ?Height, weight, and BMI. ?Blood pressure. ?Lipid and cholesterol levels. These may be checked every 5 years, or more frequently if you are over 20 years old. ?Skin check. ?Lung cancer screening. You may have this screening every year starting at age 90 if you have a 30-pack-year history of smoking and currently smoke or have quit within the past 15 years. ?Fecal occult blood test (FOBT) of the stool. You may have this test every year starting at age 84. ?Flexible sigmoidoscopy or colonoscopy. You may have a sigmoidoscopy every 5 years or a colonoscopy every 10 years starting at age 68. ?Hepatitis C blood test. ?Hepatitis B blood test. ?Sexually transmitted disease (STD) testing. ?Diabetes screening. This is done by checking your blood sugar (glucose) after you have not eaten for a while (fasting). You may have this done every 1-3 years. ?Bone density scan. This is done to screen for osteoporosis. You may have this done starting at age 7. ?Mammogram. This may be done every 1-2  years. Talk to your health care provider about how often you should have regular mammograms. ?Talk with your health care provider about your test results, treatment  options, and if necessary, the need for more tests. ?Vaccines  ?Your health care provider may recommend certain vaccines, such as: ?Influenza vaccine. This is recommended every year. ?Tetanus, diphtheria, and acellular pertussis (Tdap, Td) vaccine. You may need a Td booster every 10 years. ?Zoster vaccine. You may need this after age 46. ?Pneumococcal 13-valent conjugate (PCV13) vaccine. One dose is recommended after age 24. ?Pneumococcal polysaccharide (PPSV23) vaccine. One dose is recommended after age 59. ?Talk to your health care provider about which screenings and vaccines you need and how often you need them. ?This information is not intended to replace advice given to you by your health care provider. Make sure you discuss any questions you have with your health care provider. ?Document Released: 02/22/2015 Document Revised: 10/16/2015 Document Reviewed: 11/27/2014 ?Elsevier Interactive Patient Education ? 2017 [place]. ? ?Fall Prevention in the Home ?Falls can cause injuries. They can happen to people of all ages. There are many things you can do to make your home safe and to help prevent falls. ?What can I do on the outside of my home? ?Regularly fix the edges of walkways and driveways and fix any cracks. ?Remove anything that might make you trip as you walk through a door, such as a raised step or threshold. ?Trim any bushes or trees on the path to your home. ?Use bright outdoor lighting. ?Clear any walking paths of anything that might make someone trip, such as rocks or tools. ?Regularly check to see if handrails are loose or broken. Make sure that both sides of any steps have handrails. ?Any raised decks and porches should have guardrails on the edges. ?Have any leaves, snow, or ice cleared regularly. ?Use sand or salt on walking paths during winter. ?Clean up any spills in your garage right away. This includes oil or grease spills. ?What can I do in the bathroom? ?Use night lights. ?Install grab  bars by the toilet and in the tub and shower. Do not use towel bars as grab bars. ?Use non-skid mats or decals in the tub or shower. ?If you need to sit down in the shower, use a plastic, non-slip stool. ?Keep the floor dry. Clean up any water that spills on the floor as soon as it happens. ?Remove soap buildup in the tub or shower regularly. ?Attach bath mats securely with double-sided non-slip rug tape. ?Do not have throw rugs and other things on the floor that can make you trip. ?What can I do in the bedroom? ?Use night lights. ?Make sure that you have a light by your bed that is easy to reach. ?Do not use any sheets or blankets that are too big for your bed. They should not hang down onto the floor. ?Have a firm chair that has side arms. You can use this for support while you get dressed. ?Do not have throw rugs and other things on the floor that can make you trip. ?What can I do in the kitchen? ?Clean up any spills right away. ?Avoid walking on wet floors. ?Keep items that you use a lot in easy-to-reach places. ?If you need to reach something above you, use a strong step stool that has a grab bar. ?Keep electrical cords out of the way. ?Do not use floor polish or wax that makes floors slippery. If you must use wax, use non-skid floor  wax. ?Do not have throw rugs and other things on the floor that can make you trip. ?What can I do with my stairs? ?Do not leave any items on the stairs. ?Make sure that there are handrails on both sides of the stairs and use them. Fix handrails that are broken or loose. Make sure that handrails are as long as the stairways. ?Check any carpeting to make sure that it is firmly attached to the stairs. Fix any carpet that is loose or worn. ?Avoid having throw rugs at the top or bottom of the stairs. If you do have throw rugs, attach them to the floor with carpet tape. ?Make sure that you have a light switch at the top of the stairs and the bottom of the stairs. If you do not have them,  ask someone to add them for you. ?What else can I do to help prevent falls? ?Wear shoes that: ?Do not have high heels. ?Have rubber bottoms. ?Are comfortable and fit you well. ?Are closed at the toe. Do

## 2021-06-05 NOTE — Progress Notes (Signed)
? ?Subjective:  ? Janet Gardner is a 66 y.o. female who presents for Medicare Annual (Subsequent) preventive examination. ? ?Virtual Visit via Telephone Note ? ?I connected with  [name] on 06/05/21 at  9:45 AM EDT by telephone and verified that I am speaking with the correct person using two identifiers. ? ?Location: ?Patient: Home ?Provider: [facility] ?Persons participating in the virtual visit: patient/Nurse Health Advisor ?  ?I discussed the limitations, risks, security and privacy concerns of performing an evaluation and management service by telephone and the availability of in person appointments. The patient expressed understanding and agreed to proceed. ? ?Interactive audio and video telecommunications were attempted between this nurse and patient, however failed, due to patient having technical difficulties OR patient did not have access to video capability.  We continued and completed visit with audio only. ? ?Some vital signs may be absent or patient reported.  ? ?Janet Gardner Janet Ano, LPN  ? ?Review of Systems    ? ?Cardiac Risk Factors include: advanced age (>33men, >48 women);dyslipidemia;hypertension;obesity (BMI >30kg/m2) ? ?   ?Objective:  ?  ?Today's Vitals  ? 06/05/21 0946  ?Weight: 215 lb (97.5 kg)  ?PainSc: 3   ? ?Body mass index is 31.75 kg/m?. ? ? ?  06/05/2021  ?  9:55 AM 06/04/2020  ? 10:03 AM 06/02/2019  ?  9:35 AM 11/24/2012  ?  8:22 AM  ?Advanced Directives  ?Does Patient Have a Medical Advance Directive? No No No Patient does not have advance directive;Patient would not like information  ?Would patient like information on creating a medical advance directive? No - Patient declined Yes (MAU/Ambulatory/Procedural Areas - Information given) No - Patient declined   ? ? ?Current Medications (verified) ?Outpatient Encounter Medications as of 06/05/2021  ?Medication Sig  ? albuterol (VENTOLIN HFA) 108 (90 Base) MCG/ACT inhaler Inhale 2 puffs into the lungs every 6 (six) hours as needed for wheezing or  shortness of breath.  ? DULoxetine (CYMBALTA) 30 MG capsule Take 1 capsule (30 mg total) by mouth daily. (Patient taking differently: Take 30 mg by mouth at bedtime.)  ? levothyroxine (SYNTHROID) 125 MCG tablet TAKE 1 TABLET (125 MCG TOTAL) BY MOUTH DAILY BEFORE BREAKFAST.  ? lisinopril (ZESTRIL) 20 MG tablet Take 1 tablet (20 mg total) by mouth daily.  ? lovastatin (MEVACOR) 40 MG tablet Take 1 tablet (40 mg total) by mouth at bedtime.  ? ?No facility-administered encounter medications on file as of 06/05/2021.  ? ? ?Allergies (verified) ?Azithromycin, Levaquin [levofloxacin], and Sulfa antibiotics  ? ?History: ?Past Medical History:  ?Diagnosis Date  ? B12 deficiency   ? monthly injection  ? Fibromyalgia   ? GERD (gastroesophageal reflux disease)   ? Hypothyroidism   ? Osteopenia 06/11/2020  ? ?Past Surgical History:  ?Procedure Laterality Date  ? ABDOMINAL HYSTERECTOMY    ? CHOLECYSTECTOMY    ? colonoscopy  2007  ? Dr. Lavone Neri, incomplete due to poor prep. scope passed to transverse colon. ACBE normal.  ? ESOPHAGOGASTRODUODENOSCOPY  07/22/2009  ? [id number] apperaring esophagus s/p 56 dilator/small HH/large ulcerated gastric polyp in the prepyloric antral area. Inflammatory fibroid polyp.  ? ESOPHAGOGASTRODUODENOSCOPY (EGD) WITH ESOPHAGEAL DILATION N/A 11/24/2012  ? Procedure: ESOPHAGOGASTRODUODENOSCOPY (EGD) WITH ESOPHAGEAL DILATION;  Surgeon: Daneil Dolin, MD;  Location: [facility];  Service: Endoscopy;  Laterality: N/A;  9:30AM  ? FOOT SURGERY    ? Right  ? KIDNEY SURGERY    ? ?Family History  ?Problem Relation Age of Onset  ? Breast  cancer Mother   ? Heart disease Father   ? Colon cancer Neg Hx   ? ?Social History  ? ?Socioeconomic History  ? Marital status: Married  ?  Spouse name: Not on file  ? Number of children: 1  ? Years of education: Not on file  ? Highest education level: Not on file  ?Occupational History  ? Occupation: retired  ?  Comment: retired  ?Tobacco Use  ? Smoking status: Never  ?  Smokeless tobacco: Never  ?Vaping Use  ? Vaping Use: Never used  ?Substance and Sexual Activity  ? Alcohol use: No  ? Drug use: No  ? Sexual activity: Not on file  ?Other Topics Concern  ? Not on file  ?Social History Narrative  ? Lives with her husband and her 39 year old granddaughter. Her daughter lives in [place]  ? ?Social Determinants of Health  ? ?Financial Resource Strain: Not on file  ?Food Insecurity: Not on file  ?Transportation Needs: Not on file  ?Physical Activity: Sufficiently Active  ? Days of Exercise per Week: 7 days  ? Minutes of Exercise per Session: 30 min  ?Stress: No Stress Concern Present  ? Feeling of Stress : Not at all  ?Social Connections: Not on file  ? ? ?Tobacco Counseling ?Counseling given: Not Answered ? ? ?Clinical Intake: ? ?Pre-visit preparation completed: Yes ? ?Pain : 0-10 ?Pain Score: 3  ?Pain Type: Chronic pain ?Pain Location: Generalized ?Pain Descriptors / Indicators: Aching ?Pain Onset: More than a month ago ?Pain Frequency: Intermittent ? ?  ? ?BMI - recorded: 31.75 ?Nutritional Status: BMI > 30  Obese ?Nutritional Risks: None ?Diabetes: No ? ?How often do you need to have someone help you when you read instructions, pamphlets, or other written materials from your doctor or pharmacy?: 1 - Never ? ?Diabetic? No ? ?Interpreter Needed?: No ? ?Information entered by :: Sheryll Dymek, LPN ? ? ?Activities of Daily Living ? ?  06/05/2021  ?  9:52 AM  ?In your present state of health, do you have any difficulty performing the following activities:  ?Hearing? 0  ?Vision? 0  ?Difficulty concentrating or making decisions? 0  ?Walking or climbing stairs? 0  ?Dressing or bathing? 0  ?Doing errands, shopping? 0  ?Preparing Food and eating ? N  ?Using the Toilet? N  ?In the past six months, have you accidently leaked urine? Y  ?Comment mild - only if she waits too long  ?Do you have problems with loss of bowel control? N  ?Managing your Medications? N  ?Managing your Finances? N   ?Housekeeping or managing your Housekeeping? N  ? ? ?Patient Care Team: ?Loman Brooklyn, FNP as PCP - General (Family Medicine) ?Rourk, Cristopher Estimable, MD as Consulting Physician (Gastroenterology) ?Celestia Khat, OD (Optometry) ? ?Indicate any recent Medical Services you may have received from other than [facility] providers in the past year (date may be approximate). ? ?   ?Assessment:  ? This is a routine wellness examination for Kegan. ? ?Hearing/Vision screen ?Hearing Screening - Comments:: Denies hearing difficulties   ?Vision Screening - Comments:: Wears rx glasses - up to date with routine eye exams with [facility] ? ?Dietary issues and exercise activities discussed: ?Current Exercise Habits: Home exercise routine, Type of exercise: walking, Time (Minutes): 30, Frequency (Times/Week): 7, Weekly Exercise (Minutes/Week): 210, Intensity: Mild, Exercise limited by: None identified ? ? Goals Addressed   ? ?  ?  ?  ?  ? This Visit's Progress  ?  Exercise 150 min/wk Moderate Activity   On track  ? ?  ? ?Depression Screen ? ?  06/05/2021  ?  9:50 AM 04/10/2021  ? 10:19 AM 01/07/2021  ?  8:01 AM 11/18/2020  ?  9:02 AM 10/03/2020  ?  8:54 AM 09/04/2020  ?  8:19 AM 08/09/2020  ?  9:33 AM  ?PHQ 2/9 Scores  ?PHQ - 2 Score 0 0 0 0 0 0 0  ?PHQ- 9 Score 0 1 0  0    ?  ?Fall Risk ? ?  06/05/2021  ?  9:48 AM 04/10/2021  ? 10:19 AM 01/07/2021  ?  8:01 AM 11/18/2020  ?  9:02 AM 10/03/2020  ?  8:53 AM  ?Fall Risk   ?Falls in the past year? 0 0 1 1 1   ?Number falls in past yr: 0  0 0 0  ?Injury with Fall? 0  0 0 1  ?Risk for fall due to : No Fall Risks   History of fall(s)   ?Follow up Falls prevention discussed  Falls prevention discussed Falls prevention discussed Falls prevention discussed  ? ? ?FALL RISK PREVENTION PERTAINING TO THE HOME: ? ?Any stairs in or around the home? Yes  ?If so, are there any without handrails? No  ?Home free of loose throw rugs in walkways, pet beds, electrical cords, etc? Yes  ?Adequate lighting in your home to  reduce risk of falls? Yes  ? ?ASSISTIVE DEVICES UTILIZED TO PREVENT FALLS: ? ?Life alert? No  ?Use of a cane, walker or w/c? No  ?Grab bars in the bathroom? Yes  ?Shower chair or bench in shower? No  ?Elevated toilet seat

## 2021-06-18 ENCOUNTER — Ambulatory Visit (HOSPITAL_COMMUNITY)
Admission: RE | Admit: 2021-06-18 | Discharge: 2021-06-18 | Disposition: A | Discharge status: Home / Self Care | Payer: Medicare HMO | Source: Ambulatory Visit | Attending: Family Medicine | Admitting: Family Medicine

## 2021-06-18 DIAGNOSIS — Z1231 Encounter for screening mammogram for malignant neoplasm of breast: Secondary | ICD-10-CM | POA: Insufficient documentation

## 2021-06-27 ENCOUNTER — Encounter: Payer: Self-pay | Admitting: Family Medicine

## 2021-06-27 ENCOUNTER — Ambulatory Visit (INDEPENDENT_AMBULATORY_CARE_PROVIDER_SITE_OTHER): Payer: Medicare HMO | Admitting: Family Medicine

## 2021-06-27 VITALS — BP 162/80 | HR 75 | Temp 96.9°F | Ht 69.0 in | Wt 213.2 lb

## 2021-06-27 DIAGNOSIS — R3989 Other symptoms and signs involving the genitourinary system: Secondary | ICD-10-CM | POA: Diagnosis not present

## 2021-06-27 DIAGNOSIS — R1030 Lower abdominal pain, unspecified: Secondary | ICD-10-CM | POA: Diagnosis not present

## 2021-06-27 LAB — URINALYSIS, ROUTINE W REFLEX MICROSCOPIC
Bilirubin, UA: NEGATIVE
Glucose, UA: NEGATIVE
Ketones, UA: NEGATIVE
Nitrite, UA: NEGATIVE
Protein,UA: NEGATIVE
RBC, UA: NEGATIVE
Specific Gravity, UA: 1.02 (ref 1.005–1.030)
Urobilinogen, Ur: 0.2 mg/dL (ref 0.2–1.0)
pH, UA: 6 (ref 5.0–7.5)

## 2021-06-27 LAB — MICROSCOPIC EXAMINATION: RBC, Urine: NONE SEEN /hpf (ref 0–2)

## 2021-06-27 MED ORDER — CEFDINIR 300 MG PO CAPS: 300.0000 mg | ORAL_CAPSULE | Freq: Two times a day (BID) | ORAL | 0 refills | Status: AC

## 2021-06-27 NOTE — Progress Notes (Signed)
Assessment & Plan:  1. Suspected UTI Education provided on UTIs. Encouraged adequate hydration.  - Urine Culture - cefdinir (OMNICEF) 300 MG capsule; Take 1 capsule (300 mg total) by mouth 2 (two) times daily for 7 days.  Dispense: 14 capsule; Refill: 0  2. Lower abdominal pain - Urinalysis, Routine w reflex microscopic - Urine dipstick shows positive for leukocytes.  Micro exam: 11-30 WBC's per HPF and few bacteria.   Follow up plan: Return as scheduled.  Deliah Boston, MSN, APRN, FNP-C [facility]  Subjective:   Patient ID: Janet Gardner, female    DOB: 12-16-1955, 66 y.o.   MRN: [id number]  HPI: Janet Gardner is a 66 y.o. female presenting on 06/27/2021 for Abdominal Pain (Lower abd pain x 1 day)  Patient complains of pain in the lower abdomen. She has had symptoms for 1 day. Patient denies back pain and fever. Patient does have a history of recurrent UTI.  Patient does not have a history of pyelonephritis.    ROS: Negative unless specifically indicated above in HPI.   Relevant past medical history reviewed and updated as indicated.   Allergies and medications reviewed and updated.   Current Outpatient Medications:    albuterol (VENTOLIN HFA) 108 (90 Base) MCG/ACT inhaler, Inhale 2 puffs into the lungs every 6 (six) hours as needed for wheezing or shortness of breath., Disp: 18 g, Rfl: 1   DULoxetine (CYMBALTA) 30 MG capsule, Take 1 capsule (30 mg total) by mouth daily. (Patient taking differently: Take 30 mg by mouth at bedtime.), Disp: 90 capsule, Rfl: 1   levothyroxine (SYNTHROID) 125 MCG tablet, TAKE 1 TABLET (125 MCG TOTAL) BY MOUTH DAILY BEFORE BREAKFAST., Disp: 90 tablet, Rfl: 3   lisinopril (ZESTRIL) 20 MG tablet, Take 1 tablet (20 mg total) by mouth daily., Disp: 30 tablet, Rfl: 2   lovastatin (MEVACOR) 40 MG tablet, Take 1 tablet (40 mg total) by mouth at bedtime., Disp: 30 tablet, Rfl: 2  Allergies  Allergen Reactions   Azithromycin Other  (See Comments)    Caused hematuria.   Levaquin [Levofloxacin] Nausea And Vomiting   Sulfa Antibiotics Hives    Vomiting    Objective:   BP (!) 162/80   Pulse 75   Temp (!) 96.9 F (36.1 C) (Temporal)   Ht 5\' 9"  (1.753 m)   Wt 213 lb 3.2 oz (96.7 kg)   SpO2 97%   BMI 31.48 kg/m    Physical Exam Vitals reviewed.  Constitutional:      General: She is not in acute distress.    Appearance: Normal appearance. She is not ill-appearing, toxic-appearing or diaphoretic.  HENT:     Head: Normocephalic and atraumatic.  Eyes:     General: No scleral icterus.       Right eye: No discharge.        Left eye: No discharge.     Conjunctiva/sclera: Conjunctivae normal.  Cardiovascular:     Rate and Rhythm: Normal rate.  Pulmonary:     Effort: Pulmonary effort is normal. No respiratory distress.  Abdominal:     General: Abdomen is flat. Bowel sounds are normal. There is no distension or abdominal bruit. There are no signs of injury.     Palpations: Abdomen is rigid. There is no shifting dullness, fluid wave, hepatomegaly, splenomegaly, mass or pulsatile mass.     Tenderness: There is abdominal tenderness in the suprapubic area. There is no right CVA tenderness or left CVA tenderness.  Musculoskeletal:  General: Normal range of motion.     Cervical back: Normal range of motion.  Skin:    General: Skin is warm and dry.     Capillary Refill: Capillary refill takes less than 2 seconds.  Neurological:     General: No focal deficit present.     Mental Status: She is alert and oriented to person, place, and time. Mental status is at baseline.  Psychiatric:        Mood and Affect: Mood normal.        Behavior: Behavior normal.        Thought Content: Thought content normal.        Judgment: Judgment normal.

## 2021-07-10 ENCOUNTER — Ambulatory Visit (INDEPENDENT_AMBULATORY_CARE_PROVIDER_SITE_OTHER): Payer: Medicare HMO | Admitting: Family Medicine

## 2021-07-10 ENCOUNTER — Encounter: Payer: Self-pay | Admitting: Family Medicine

## 2021-07-10 VITALS — BP 134/76 | HR 88 | Temp 97.6°F | Ht 69.0 in | Wt 215.0 lb

## 2021-07-10 DIAGNOSIS — Z0001 Encounter for general adult medical examination with abnormal findings: Secondary | ICD-10-CM

## 2021-07-10 DIAGNOSIS — E559 Vitamin D deficiency, unspecified: Secondary | ICD-10-CM

## 2021-07-10 DIAGNOSIS — I10 Essential (primary) hypertension: Secondary | ICD-10-CM

## 2021-07-10 DIAGNOSIS — Z0000 Encounter for general adult medical examination without abnormal findings: Secondary | ICD-10-CM | POA: Diagnosis not present

## 2021-07-10 DIAGNOSIS — E785 Hyperlipidemia, unspecified: Secondary | ICD-10-CM

## 2021-07-10 DIAGNOSIS — E039 Hypothyroidism, unspecified: Secondary | ICD-10-CM

## 2021-07-10 DIAGNOSIS — E538 Deficiency of other specified B group vitamins: Secondary | ICD-10-CM

## 2021-07-10 MED ORDER — LOVASTATIN 40 MG PO TABS: 40.0000 mg | ORAL_TABLET | Freq: Every day | ORAL | 3 refills | Status: DC

## 2021-07-10 MED ORDER — LISINOPRIL 20 MG PO TABS: 20.0000 mg | ORAL_TABLET | Freq: Every day | ORAL | 3 refills | Status: DC

## 2021-07-10 MED ORDER — LEVOTHYROXINE SODIUM 125 MCG PO TABS: 125.0000 ug | ORAL_TABLET | Freq: Every day | ORAL | 3 refills | Status: DC

## 2021-07-10 NOTE — Progress Notes (Signed)
Assessment & Plan:  Well adult exam Discussed health benefits of physical activity, and encouraged her to engage in regular exercise appropriate for her age and condition. Preventive health education provided. Patient declined Shingrix, PNA, and COVID booster.  Immunization History  Administered Date(s) Administered   Fluad Quad(high Dose 65+) 10/13/2018, 10/27/2019, 11/11/2020   Influenza,inj,Quad PF,6+ Mos 11/04/2016, 11/09/2017   Influenza-Unspecified 11/23/2013, 11/20/2015, 11/04/2016, 11/09/2017, 10/13/2018, 10/27/2019   Moderna Sars-Covid-2 Vaccination 04/06/2019, 05/05/2019, 03/05/2020   Pneumococcal Conjugate-13 09/02/2018   Tdap 07/15/2015   Health Maintenance  Topic Date Due   COVID-19 Vaccine (4 - Booster for Moderna series) 07/26/2021 (Originally 04/30/2020)   Zoster Vaccines- Shingrix (1 of 2) 10/10/2021 (Originally 04/24/1974)   Pneumonia Vaccine 21+ Years old (2 - PPSV23 if available, else PCV20) 01/07/2022 (Originally 09/02/2019)   INFLUENZA VACCINE  09/09/2021   DEXA SCAN  06/12/2022   MAMMOGRAM  06/19/2022   Fecal DNA (Cologuard)  06/26/2023   TETANUS/TDAP  07/14/2025   Hepatitis C Screening  Completed   HPV VACCINES  Aged Out   COLONOSCOPY (Pts 45-6yr Insurance coverage will need to be confirmed)  Discontinued   - CBC with Differential/Platelet - CMP14+EGFR - Lipid panel  2. Dyslipidemia - lovastatin (MEVACOR) 40 MG tablet; Take 1 tablet (40 mg total) by mouth at bedtime.  Dispense: 90 tablet; Refill: 3 - CBC with Differential/Platelet - CMP14+EGFR - Lipid panel  3. Essential hypertension Well controlled on current regimen.  - lisinopril (ZESTRIL) 20 MG tablet; Take 1 tablet (20 mg total) by mouth daily.  Dispense: 90 tablet; Refill: 3 - CBC with Differential/Platelet - CMP14+EGFR - Lipid panel  4. Acquired hypothyroidism Well controlled on current regimen.  - levothyroxine (SYNTHROID) 125 MCG tablet; Take 1 tablet (125 mcg total) by mouth daily  before breakfast.  Dispense: 90 tablet; Refill: 3 - TSH - T4, free  5. Vitamin B12 deficiency - Vitamin B12  6. Vitamin D deficiency - VITAMIN D 25 Hydroxy (Vit-D Deficiency, Fractures)   Follow-up: Return in about 6 months (around 01/09/2022) for follow-up of chronic medication conditions.   BHendricks Limes MSN, APRN, FNP-C [facility]  Subjective:  Patient ID: Janet Gardner female    DOB: [birth year] Age: 66y.o. MRN: [id number] Patient Care Team: JLoman Brooklyn FNP as PCP - General (Family Medicine) RGala RomneyRCristopher Estimable MD as Consulting Physician (Gastroenterology) JCelestia Khat OD (Optometry)   CC:  Chief Complaint  Patient presents with   Annual Exam    HPI Janet JANEWAYis a 66y.o. female who presents today for a complete physical exam. She reports consuming a general diet. Home exercise routine includes walking 5,000-6,000 steps per day. She generally feels well. She reports sleeping well. She does not have additional problems to discuss today.   Vision:Not within last year and upcoming appointment Dental:No regular dental care  Advanced Directives Patient does not have advanced directives including DNR, living will, healthcare power of attorney, financial power of attorney, and MOST form.   DEPRESSION SCREENING    07/10/2021    2:22 PM 06/27/2021   11:11 AM 06/05/2021    9:50 AM 04/10/2021   10:19 AM 01/07/2021    8:01 AM 11/18/2020    9:02 AM 10/03/2020    8:54 AM  PHQ 2/9 Scores  PHQ - 2 Score 0 0 0 0 0 0 0  PHQ- 9 Score 0 2 0 1 0  0     Review of Systems  Constitutional:  Negative  for chills, fever, malaise/fatigue and weight loss.  HENT:  Negative for congestion, ear discharge, ear pain, nosebleeds, sinus pain, sore throat and tinnitus.   Eyes:  Negative for blurred vision, double vision, pain, discharge and redness.  Respiratory:  Negative for cough, shortness of breath and wheezing.   Cardiovascular:  Negative for chest pain,  palpitations and leg swelling.  Gastrointestinal:  Negative for abdominal pain, constipation, diarrhea, heartburn, nausea and vomiting.  Genitourinary:  Negative for dysuria, frequency and urgency.  Musculoskeletal:  Positive for joint pain. Negative for myalgias.  Skin:  Negative for rash.  Neurological:  Negative for dizziness, seizures, weakness and headaches.  Psychiatric/Behavioral:  Negative for depression, substance abuse and suicidal ideas. The patient is not nervous/anxious.     Current Outpatient Medications:    albuterol (VENTOLIN HFA) 108 (90 Base) MCG/ACT inhaler, Inhale 2 puffs into the lungs every 6 (six) hours as needed for wheezing or shortness of breath., Disp: 18 g, Rfl: 1   levothyroxine (SYNTHROID) 125 MCG tablet, TAKE 1 TABLET (125 MCG TOTAL) BY MOUTH DAILY BEFORE BREAKFAST., Disp: 90 tablet, Rfl: 3   lisinopril (ZESTRIL) 20 MG tablet, Take 1 tablet (20 mg total) by mouth daily., Disp: 30 tablet, Rfl: 2   lovastatin (MEVACOR) 40 MG tablet, Take 1 tablet (40 mg total) by mouth at bedtime., Disp: 30 tablet, Rfl: 2  Allergies  Allergen Reactions   Azithromycin Other (See Comments)    Caused hematuria.   Levaquin [Levofloxacin] Nausea And Vomiting   Sulfa Antibiotics Hives    Vomiting    Past Medical History:  Diagnosis Date   B12 deficiency    monthly injection   Fibromyalgia    GERD (gastroesophageal reflux disease)    Hypothyroidism    Osteopenia 06/11/2020    Past Surgical History:  Procedure Laterality Date   ABDOMINAL HYSTERECTOMY     CHOLECYSTECTOMY     colonoscopy  2007   Dr. Lavone Neri, incomplete due to poor prep. scope passed to transverse colon. ACBE normal.   ESOPHAGOGASTRODUODENOSCOPY  07/22/2009   WUJ:[id number] apperaring esophagus s/p 56 dilator/small HH/large ulcerated gastric polyp in the prepyloric antral area. Inflammatory fibroid polyp.   ESOPHAGOGASTRODUODENOSCOPY (EGD) WITH ESOPHAGEAL DILATION N/A 11/24/2012   Procedure:  ESOPHAGOGASTRODUODENOSCOPY (EGD) WITH ESOPHAGEAL DILATION;  Surgeon: Daneil Dolin, MD;  Location: [facility];  Service: Endoscopy;  Laterality: N/A;  9:30AM   FOOT SURGERY     Right   KIDNEY SURGERY      Family History  Problem Relation Age of Onset   Breast cancer Mother    Heart disease Father    Colon cancer Neg Hx     Social History   Socioeconomic History   Marital status: Married    Spouse name: Not on file   Number of children: 1   Years of education: Not on file   Highest education level: Not on file  Occupational History   Occupation: retired    Comment: retired  Tobacco Use   Smoking status: Never   Smokeless tobacco: Never  Scientific laboratory technician Use: Never used  Substance and Sexual Activity   Alcohol use: No   Drug use: No   Sexual activity: Not on file  Other Topics Concern   Not on file  Social History Narrative   Lives with her husband and her 14 year old granddaughter. Her daughter lives in [place] Determinants of Health   Financial Resource Strain: Not on [employer] Insecurity: Not on  file  Transportation Needs: Not on file  Physical Activity: Sufficiently Active   Days of Exercise per Week: 7 days   Minutes of Exercise per Session: 30 min  Stress: No Stress Concern Present   Feeling of Stress : Not at all  Social Connections: Not on file  Intimate Partner Violence: Not At Risk   Fear of Current or Ex-Partner: No   Emotionally Abused: No   Physically Abused: No   Sexually Abused: No      Objective:    BP 134/76   Pulse 88   Temp 97.6 F (36.4 C)   Ht _0  (1.753 m)   Wt 215 lb (97.5 kg)   SpO2 95%   BMI 31.75 kg/m   Wt Readings from Last 3 Encounters:  07/10/21 215 lb (97.5 kg)  06/27/21 213 lb 3.2 oz (96.7 kg)  06/05/21 215 lb (97.5 kg)   Physical Exam Vitals reviewed.  Constitutional:      General: She is not in acute distress.    Appearance: Normal appearance. She is obese. She is not ill-appearing,  toxic-appearing or diaphoretic.  HENT:     Head: Normocephalic and atraumatic.     Right Ear: Tympanic membrane, ear canal and external ear normal. There is no impacted cerumen.     Left Ear: Tympanic membrane, ear canal and external ear normal. There is no impacted cerumen.     Nose: Nose normal. No congestion or rhinorrhea.     Mouth/Throat:     Mouth: Mucous membranes are moist.     Pharynx: Oropharynx is clear. No oropharyngeal exudate or posterior oropharyngeal erythema.  Eyes:     General: No scleral icterus.       Right eye: No discharge.        Left eye: No discharge.     Conjunctiva/sclera: Conjunctivae normal.     Pupils: Pupils are equal, round, and reactive to light.  Cardiovascular:     Rate and Rhythm: Normal rate and regular rhythm.     Heart sounds: Normal heart sounds. No murmur heard.   No friction rub. No gallop.  Pulmonary:     Effort: Pulmonary effort is normal. No respiratory distress.     Breath sounds: Normal breath sounds. No stridor. No wheezing, rhonchi or rales.  Abdominal:     General: Abdomen is flat. Bowel sounds are normal. There is no distension.     Palpations: Abdomen is soft. There is no hepatomegaly, splenomegaly or mass.     Tenderness: There is no abdominal tenderness. There is no guarding or rebound.     Hernia: No hernia is present.  Musculoskeletal:        General: Normal range of motion.     Cervical back: Normal range of motion and neck supple. No rigidity. No muscular tenderness.  Lymphadenopathy:     Cervical: No cervical adenopathy.  Skin:    General: Skin is warm and dry.     Capillary Refill: Capillary refill takes less than 2 seconds.  Neurological:     General: No focal deficit present.     Mental Status: She is alert and oriented to person, place, and time. Mental status is at baseline.  Psychiatric:        Mood and Affect: Mood normal.        Behavior: Behavior normal.        Thought Content: Thought content normal.         Judgment: Judgment normal.    Lab  Results  Component Value Date   TSH 0.677 01/07/2021   Lab Results  Component Value Date   WBC 4.6 01/07/2021   HGB 13.0 01/07/2021   HCT 39.7 01/07/2021   MCV 83 01/07/2021   PLT 246 01/07/2021   Lab Results  Component Value Date   NA 137 01/07/2021   K 4.4 01/07/2021   CO2 24 01/07/2021   GLUCOSE 92 01/07/2021   BUN 15 01/07/2021   CREATININE 0.67 01/07/2021   BILITOT 0.4 01/07/2021   ALKPHOS 88 01/07/2021   AST 18 01/07/2021   ALT 13 01/07/2021   PROT 7.0 01/07/2021   ALBUMIN 3.8 01/07/2021   CALCIUM 8.8 01/07/2021   EGFR 96 01/07/2021   Lab Results  Component Value Date   CHOL 181 01/07/2021   Lab Results  Component Value Date   HDL 49 01/07/2021   Lab Results  Component Value Date   LDLCALC 118 (H) 01/07/2021   Lab Results  Component Value Date   TRIG 77 01/07/2021   Lab Results  Component Value Date   CHOLHDL 3.7 01/07/2021   No results found for: HGBA1C

## 2021-07-11 ENCOUNTER — Encounter: Payer: Self-pay | Admitting: Family Medicine

## 2021-07-11 ENCOUNTER — Other Ambulatory Visit: Payer: Self-pay | Admitting: Family Medicine

## 2021-07-11 DIAGNOSIS — E039 Hypothyroidism, unspecified: Secondary | ICD-10-CM

## 2021-07-11 LAB — CMP14+EGFR
ALT: 17 IU/L (ref 0–32)
AST: 16 IU/L (ref 0–40)
Albumin/Globulin Ratio: 1.3 (ref 1.2–2.2)
Albumin: 3.9 g/dL (ref 3.8–4.8)
Alkaline Phosphatase: 81 IU/L (ref 44–121)
BUN/Creatinine Ratio: 18 (ref 12–28)
BUN: 14 mg/dL (ref 8–27)
Bilirubin Total: 0.3 mg/dL (ref 0.0–1.2)
CO2: 23 mmol/L (ref 20–29)
Calcium: 8.6 mg/dL — ABNORMAL LOW (ref 8.7–10.3)
Chloride: 103 mmol/L (ref 96–106)
Creatinine, Ser: 0.79 mg/dL (ref 0.57–1.00)
Globulin, Total: 2.9 g/dL (ref 1.5–4.5)
Glucose: 120 mg/dL — ABNORMAL HIGH (ref 70–99)
Potassium: 4.2 mmol/L (ref 3.5–5.2)
Sodium: 136 mmol/L (ref 134–144)
Total Protein: 6.8 g/dL (ref 6.0–8.5)
eGFR: 81 mL/min/{1.73_m2} (ref >59)

## 2021-07-11 LAB — CBC WITH DIFFERENTIAL/PLATELET
Basophils Absolute: 0 10*3/uL (ref 0.0–0.2)
Basos: 1 %
EOS (ABSOLUTE): 0.2 10*3/uL (ref 0.0–0.4)
Eos: 3 %
Hematocrit: 39.9 % (ref 34.0–46.6)
Hemoglobin: 12.6 g/dL (ref 11.1–15.9)
Immature Grans (Abs): 0 10*3/uL (ref 0.0–0.1)
Immature Granulocytes: 0 %
Lymphocytes Absolute: 1.2 10*3/uL (ref 0.7–3.1)
Lymphs: 25 %
MCH: 26.1 pg — ABNORMAL LOW (ref 26.6–33.0)
MCHC: 31.6 g/dL (ref 31.5–35.7)
MCV: 83 fL (ref 79–97)
Monocytes Absolute: 0.3 10*3/uL (ref 0.1–0.9)
Monocytes: 7 %
Neutrophils Absolute: 3.1 10*3/uL (ref 1.4–7.0)
Neutrophils: 64 %
Platelets: 259 10*3/uL (ref 150–450)
RBC: 4.83 x10E6/uL (ref 3.77–5.28)
RDW: 14.2 % (ref 11.7–15.4)
WBC: 4.9 10*3/uL (ref 3.4–10.8)

## 2021-07-11 LAB — LIPID PANEL
Chol/HDL Ratio: 3.9 ratio (ref 0.0–4.4)
Cholesterol, Total: 170 mg/dL (ref 100–199)
HDL: 44 mg/dL (ref >39)
LDL Chol Calc (NIH): 111 mg/dL — ABNORMAL HIGH (ref 0–99)
Triglycerides: 77 mg/dL (ref 0–149)
VLDL Cholesterol Cal: 15 mg/dL (ref 5–40)

## 2021-07-11 LAB — VITAMIN D 25 HYDROXY (VIT D DEFICIENCY, FRACTURES): Vit D, 25-Hydroxy: 30.8 ng/mL (ref 30.0–100.0)

## 2021-07-11 LAB — T4, FREE: Free T4: 1.63 ng/dL (ref 0.82–1.77)

## 2021-07-11 LAB — TSH: TSH: 0.371 u[IU]/mL — ABNORMAL LOW (ref 0.450–4.500)

## 2021-07-11 LAB — VITAMIN B12: Vitamin B-12: 214 pg/mL — ABNORMAL LOW (ref 232–1245)

## 2021-07-17 ENCOUNTER — Encounter: Payer: Self-pay | Admitting: Family Medicine

## 2021-07-18 ENCOUNTER — Encounter: Payer: Self-pay | Admitting: Family Medicine

## 2021-07-18 NOTE — Telephone Encounter (Signed)
Pt rc for lab results please call back [phone]

## 2021-08-20 ENCOUNTER — Encounter: Payer: Self-pay | Admitting: Family Medicine

## 2021-09-23 ENCOUNTER — Other Ambulatory Visit: Payer: Medicare HMO

## 2021-09-23 DIAGNOSIS — E039 Hypothyroidism, unspecified: Secondary | ICD-10-CM | POA: Diagnosis not present

## 2021-09-24 ENCOUNTER — Other Ambulatory Visit: Payer: Self-pay | Admitting: Family Medicine

## 2021-09-24 ENCOUNTER — Encounter: Payer: Self-pay | Admitting: Family Medicine

## 2021-09-24 DIAGNOSIS — E039 Hypothyroidism, unspecified: Secondary | ICD-10-CM

## 2021-09-24 LAB — TSH: TSH: 0.332 u[IU]/mL — ABNORMAL LOW (ref 0.450–4.500)

## 2021-09-24 LAB — T4, FREE: Free T4: 1.72 ng/dL (ref 0.82–1.77)

## 2021-09-24 MED ORDER — LEVOTHYROXINE SODIUM 112 MCG PO TABS: 112.0000 ug | ORAL_TABLET | Freq: Every day | ORAL | 2 refills | Status: DC

## 2021-10-07 DIAGNOSIS — H524 Presbyopia: Secondary | ICD-10-CM | POA: Diagnosis not present

## 2021-10-07 DIAGNOSIS — Z0100 Encounter for examination of eyes and vision without abnormal findings: Secondary | ICD-10-CM | POA: Diagnosis not present

## 2021-10-08 ENCOUNTER — Encounter: Payer: Self-pay | Admitting: Family Medicine

## 2021-10-27 ENCOUNTER — Encounter: Payer: Self-pay | Admitting: Family Medicine

## 2021-11-03 IMAGING — DX DG FOOT COMPLETE 3+V*R*
3 series · 3 of 3 positions shown · non-contrast
Comparison: None.

CLINICAL DATA: Pain following fall

EXAM:
RIGHT FOOT COMPLETE - 3+ VIEW

[foot ap]
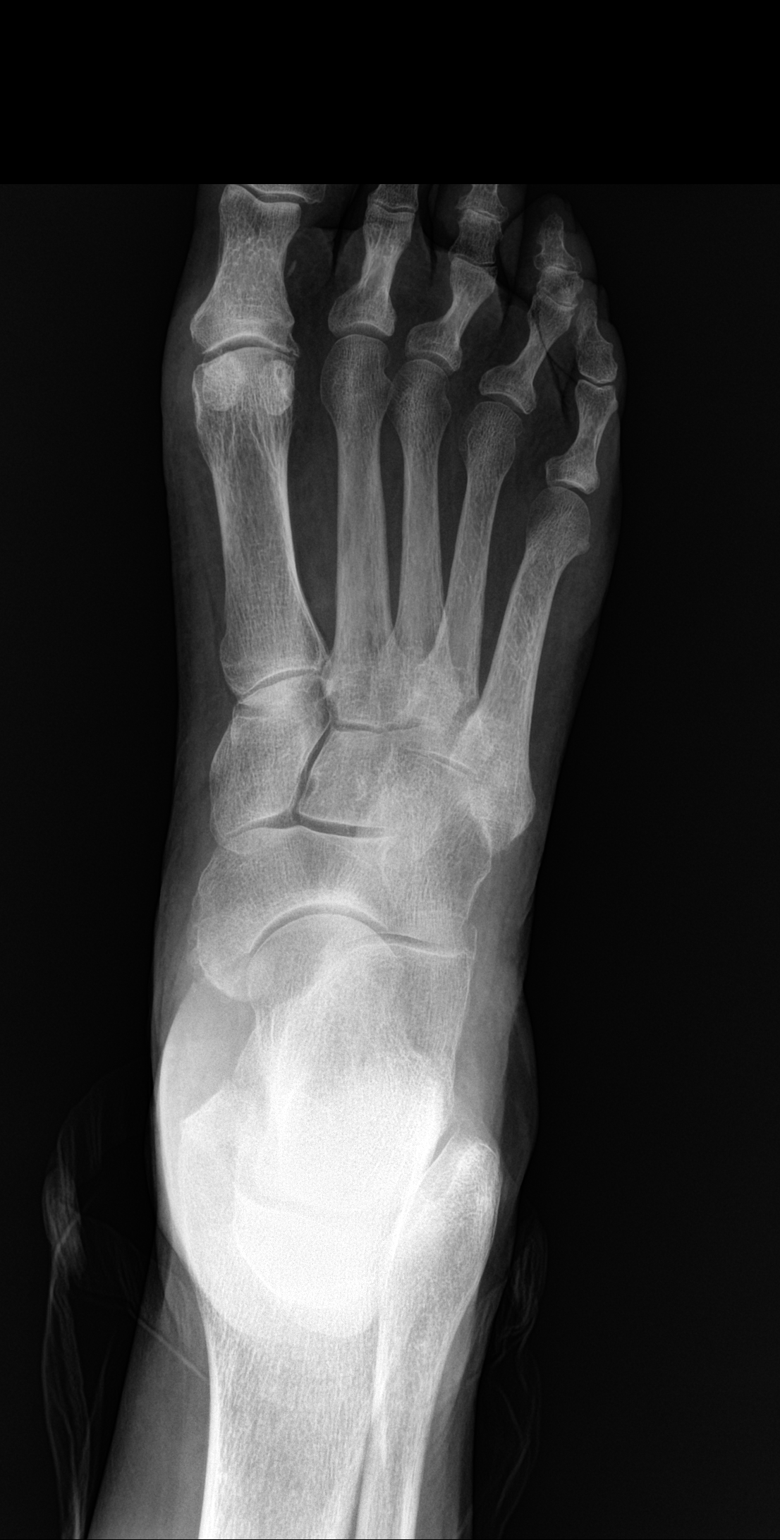

[foot obl]
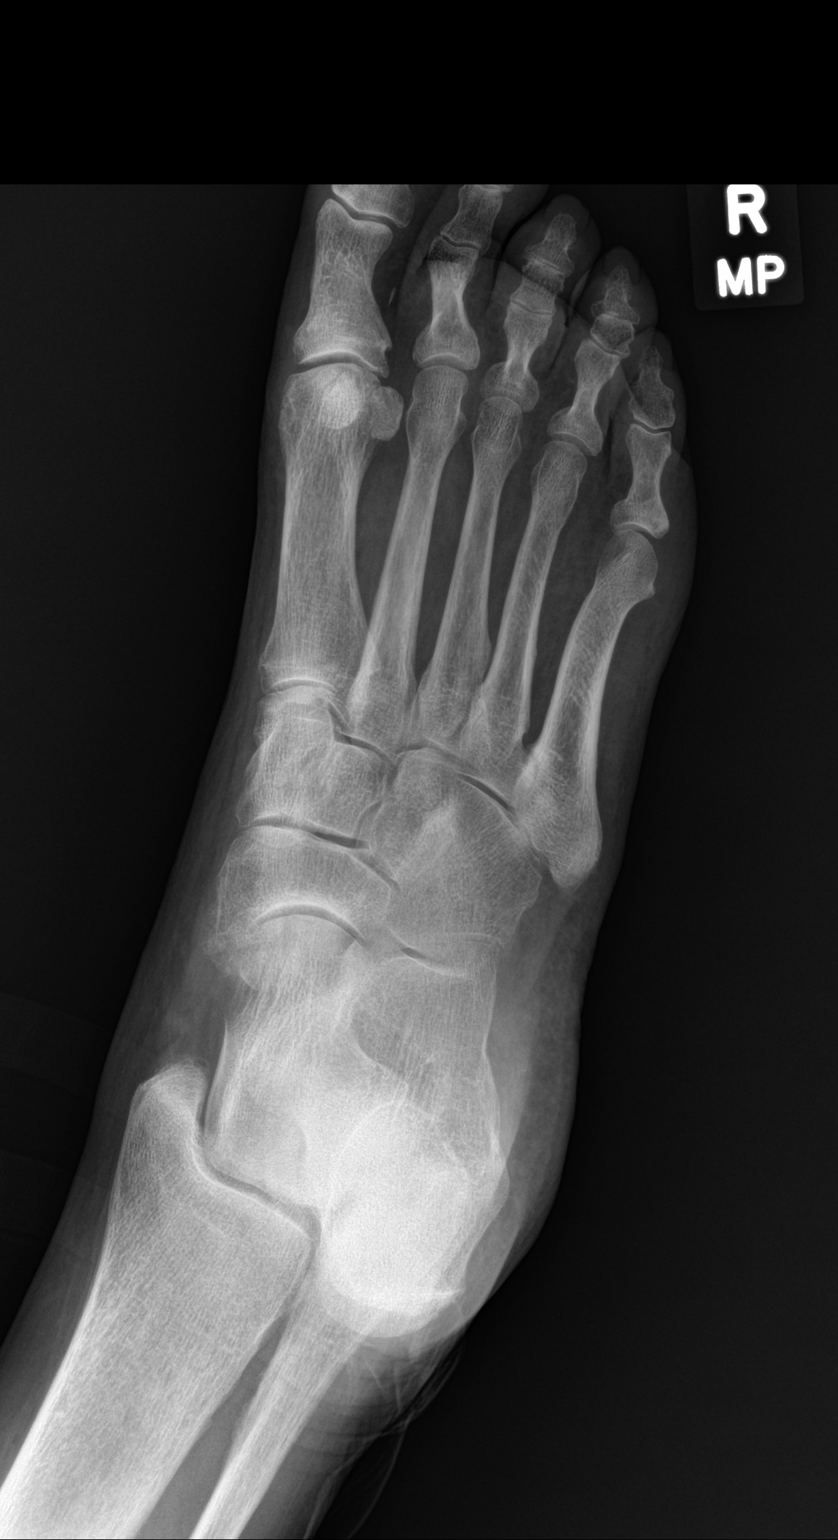

[foot lat]
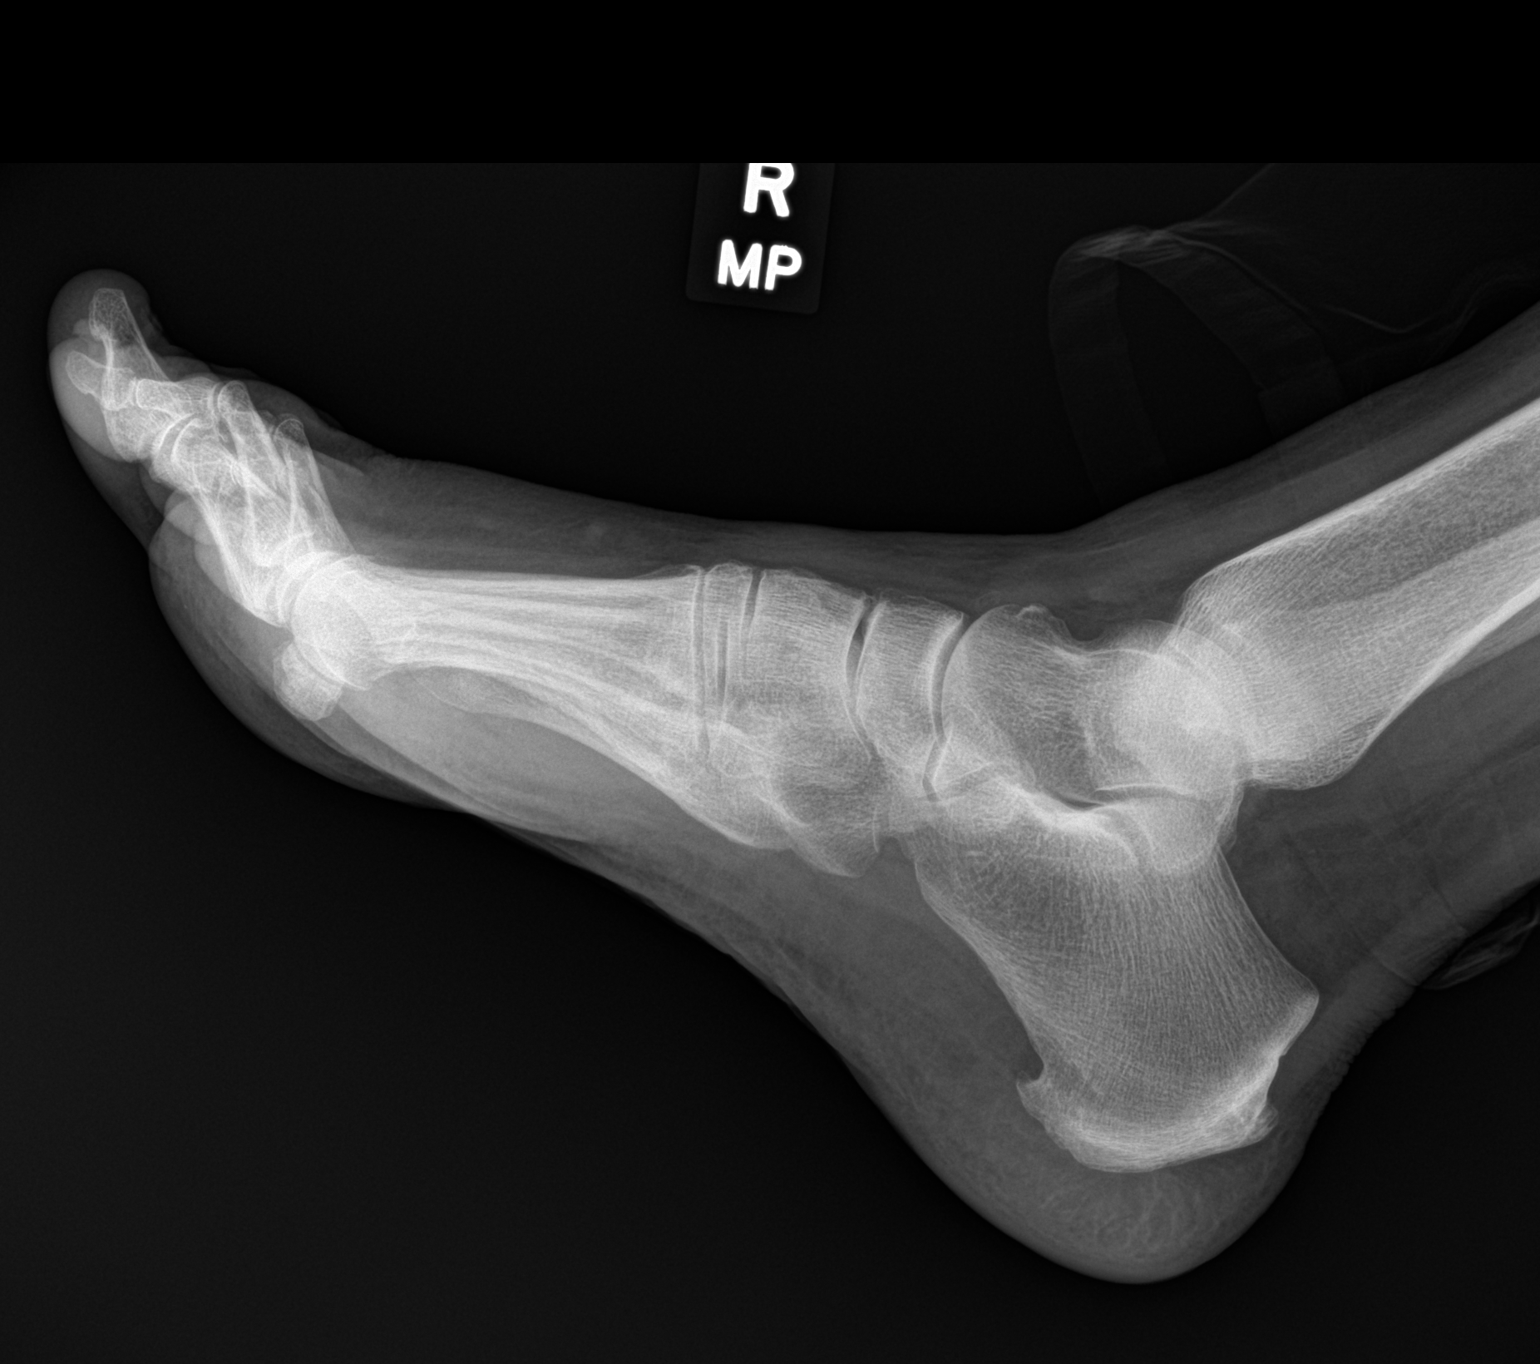

[3 of 3 positions shown; findings below may reference images not displayed]

FINDINGS: Frontal, oblique, and lateral views were obtained. No fracture or
dislocation. There is a linear radiopaque foreign body located
lateral to the midportion of the first proximal phalanx measuring 3
mm in length. There is osteoarthritic change in the first MTP joint.
Other joint spaces appear unremarkable. No erosive change. There are
small posterior and inferior calcaneal spurs.
IMPRESSION: 1. 3 mm nonmetallic radiopaque foreign body lateral to the
midportion of the first proximal phalanx.

2.  No fracture or dislocation.

3. Moderate osteoarthritic change first MTP joint. Other joint
spaces appear unremarkable.

4.  Calcaneal spurs present.

## 2021-11-05 ENCOUNTER — Ambulatory Visit (INDEPENDENT_AMBULATORY_CARE_PROVIDER_SITE_OTHER): Payer: Medicare HMO | Admitting: Family Medicine

## 2021-11-05 ENCOUNTER — Encounter: Payer: Self-pay | Admitting: Family Medicine

## 2021-11-05 VITALS — BP 163/94 | HR 72 | Temp 98.0°F | Ht 69.0 in | Wt 209.0 lb

## 2021-11-05 DIAGNOSIS — R300 Dysuria: Secondary | ICD-10-CM

## 2021-11-05 DIAGNOSIS — E039 Hypothyroidism, unspecified: Secondary | ICD-10-CM | POA: Diagnosis not present

## 2021-11-05 DIAGNOSIS — I10 Essential (primary) hypertension: Secondary | ICD-10-CM | POA: Diagnosis not present

## 2021-11-05 DIAGNOSIS — E538 Deficiency of other specified B group vitamins: Secondary | ICD-10-CM

## 2021-11-05 DIAGNOSIS — E559 Vitamin D deficiency, unspecified: Secondary | ICD-10-CM | POA: Diagnosis not present

## 2021-11-05 DIAGNOSIS — N3000 Acute cystitis without hematuria: Secondary | ICD-10-CM | POA: Diagnosis not present

## 2021-11-05 DIAGNOSIS — E785 Hyperlipidemia, unspecified: Secondary | ICD-10-CM | POA: Diagnosis not present

## 2021-11-05 LAB — URINALYSIS, ROUTINE W REFLEX MICROSCOPIC
Bilirubin, UA: NEGATIVE
Glucose, UA: NEGATIVE
Ketones, UA: NEGATIVE
Nitrite, UA: NEGATIVE
Protein,UA: NEGATIVE
RBC, UA: NEGATIVE
Specific Gravity, UA: 1.025 (ref 1.005–1.030)
Urobilinogen, Ur: 0.2 mg/dL (ref 0.2–1.0)
pH, UA: 5.5 (ref 5.0–7.5)

## 2021-11-05 LAB — MICROSCOPIC EXAMINATION
RBC, Urine: NONE SEEN /hpf (ref 0–2)
Renal Epithel, UA: NONE SEEN /hpf

## 2021-11-05 MED ORDER — FLUCONAZOLE 150 MG PO TABS: ORAL_TABLET | ORAL | 0 refills | Status: DC

## 2021-11-05 MED ORDER — CEPHALEXIN 500 MG PO CAPS: 500.0000 mg | ORAL_CAPSULE | Freq: Two times a day (BID) | ORAL | 0 refills | Status: DC

## 2021-11-05 NOTE — Progress Notes (Signed)
Subjective:  Patient ID: Janet Gardner, female    DOB: 1956-01-06, 66 y.o.   MRN: [id number]  Patient Care Team: Loman Brooklyn, FNP as PCP - General (Family Medicine) Gala Romney Cristopher Estimable, MD as Consulting Physician (Gastroenterology) Celestia Khat, [place] (Optometry)   Chief Complaint:  Dysuria (Frequency/malodorous)   HPI: Janet Gardner is a 66 y.o. female presenting on 11/05/2021 for Dysuria (Frequency/malodorous)   Dysuria  This is a new problem. The current episode started 1 to 4 weeks ago. The problem occurs every urination. The problem has been unchanged. The quality of the pain is described as burning and aching. The pain is mild. There has been no fever. She is Not sexually active. There is No history of pyelonephritis. Associated symptoms include frequency. Pertinent negatives include no chills, discharge, flank pain, hematuria, hesitancy, nausea, possible pregnancy, sweats, urgency or vomiting. She has tried increased fluids for the symptoms. The treatment provided no relief.   Vitamin D deficiency Pt is taking oral repletion therapy. Denies bone pain and tenderness, muscle weakness, fracture, and difficulty walking. Lab Results  Component Value Date   VD25OH 30.8 07/10/2021   VD25OH 26.3 (L) 01/07/2021   VD25OH 25.5 (L) 06/11/2020   Lab Results  Component Value Date   CALCIUM 8.6 (L) 07/10/2021     Vitamin B12 deficiency Has been taking repletion therapy but levels have remained low. No oral lesions noted. Does have fatigue but attributes this to her fibromyalgia.  5. Acquired hypothyroidism On repletion therapy. Dose was decreased at last visit due to low TSH. No reported hyperthyroid symptoms.   6. Essential hypertension States controlled at home. No headaches, leg swelling, visual changes, or confusion. Has not taken her medications this morning.      Relevant past medical, surgical, family, and social history reviewed and updated as indicated.  Allergies and  medications reviewed and updated. Data reviewed: Chart in Epic.   Past Medical History:  Diagnosis Date   B12 deficiency    monthly injection   Fibromyalgia    GERD (gastroesophageal reflux disease)    Hyperlipidemia    Hypothyroidism    Osteopenia 06/11/2020    Past Surgical History:  Procedure Laterality Date   ABDOMINAL HYSTERECTOMY     CHOLECYSTECTOMY     colonoscopy  2007   Dr. Lavone Neri, incomplete due to poor prep. scope passed to transverse colon. ACBE normal.   ESOPHAGOGASTRODUODENOSCOPY  07/22/2009   [id number] apperaring esophagus s/p 56 dilator/small HH/large ulcerated gastric polyp in the prepyloric antral area. Inflammatory fibroid polyp.   ESOPHAGOGASTRODUODENOSCOPY (EGD) WITH ESOPHAGEAL DILATION N/A 11/24/2012   Procedure: ESOPHAGOGASTRODUODENOSCOPY (EGD) WITH ESOPHAGEAL DILATION;  Surgeon: Daneil Dolin, MD;  Location: [facility];  Service: Endoscopy;  Laterality: N/A;  9:30AM   FOOT SURGERY     Right   KIDNEY SURGERY      Social History   Socioeconomic History   Marital status: Married    Spouse name: Not on file   Number of children: 1   Years of education: Not on file   Highest education level: Not on file  Occupational History   Occupation: retired    Comment: retired  Tobacco Use   Smoking status: Never   Smokeless tobacco: Never  Vaping Use   Vaping Use: Never used  Substance and Sexual Activity   Alcohol use: No   Drug use: No   Sexual activity: Not on file  Other Topics Concern   Not on file  Social History  Narrative   Lives with her husband and her 98 year old granddaughter. Her daughter lives in [place] Strain: Low Risk  (06/04/2020)   Overall Financial Resource Strain (CARDIA)    Difficulty of Paying Living Expenses: Not hard at all  Food Insecurity: No Food Insecurity (06/04/2020)   Hunger Vital Sign    Worried About Running Out of Food in the Last Year: Never true    Ran Out  of Food in the Last Year: Never true  Transportation Needs: No Transportation Needs (06/04/2020)   PRAPARE - Hydrologist (Medical): No    Lack of Transportation (Non-Medical): No  Physical Activity: Sufficiently Active (06/05/2021)   Exercise Vital Sign    Days of Exercise per Week: 7 days    Minutes of Exercise per Session: 30 min  Stress: No Stress Concern Present (06/05/2021)   [place]    Feeling of Stress : Not at all  Social Connections: [place] (06/04/2020)   Social Connection and Isolation Panel [NHANES]    Frequency of Communication with Friends and Family: More than three times a week    Frequency of Social Gatherings with Friends and Family: Once a week    Attends Religious Services: More than 4 times per year    Active Member of [employer] or Organizations: Yes    Attends Music therapist: More than 4 times per year    Marital Status: Married  Human resources officer Violence: Not At Risk (06/05/2021)   Humiliation, Afraid, Rape, and Kick questionnaire    Fear of Current or Ex-Partner: No    Emotionally Abused: No    Physically Abused: No    Sexually Abused: No    Outpatient Encounter Medications as of 11/05/2021  Medication Sig   cephALEXin (KEFLEX) 500 MG capsule Take 1 capsule (500 mg total) by mouth 2 (two) times daily.   fluconazole (DIFLUCAN) 150 MG tablet 1 po q week x 4 weeks   levothyroxine (SYNTHROID) 112 MCG tablet Take 1 tablet (112 mcg total) by mouth daily before breakfast.   lisinopril (ZESTRIL) 20 MG tablet Take 1 tablet (20 mg total) by mouth daily.   lovastatin (MEVACOR) 40 MG tablet Take 1 tablet (40 mg total) by mouth at bedtime.   albuterol (VENTOLIN HFA) 108 (90 Base) MCG/ACT inhaler Inhale 2 puffs into the lungs every 6 (six) hours as needed for wheezing or shortness of breath. (Patient not taking: Reported on 11/05/2021)   No  facility-administered encounter medications on file as of 11/05/2021.    Allergies  Allergen Reactions   Azithromycin Other (See Comments)    Caused hematuria.   Levaquin [Levofloxacin] Nausea And Vomiting   Sulfa Antibiotics Hives    Vomiting    Review of Systems  Constitutional:  Positive for fatigue. Negative for activity change, appetite change, chills, diaphoresis, fever and unexpected weight change.  HENT: Negative.    Eyes: Negative.  Negative for photophobia and visual disturbance.  Respiratory:  Negative for cough, chest tightness and shortness of breath.   Cardiovascular:  Negative for chest pain, palpitations and leg swelling.  Gastrointestinal:  Negative for abdominal pain, blood in stool, constipation, diarrhea, nausea and vomiting.  Endocrine: Negative.   Genitourinary:  Positive for dysuria and frequency. Negative for decreased urine volume, difficulty urinating, enuresis, flank pain, genital sores, hematuria, hesitancy, menstrual problem, pelvic pain, urgency, vaginal bleeding, vaginal discharge and  vaginal pain.  Musculoskeletal:  Positive for arthralgias and myalgias.  Skin: Negative.   Allergic/Immunologic: Negative.   Neurological:  Negative for dizziness, tremors, seizures, syncope, facial asymmetry, speech difficulty, weakness, light-headedness, numbness and headaches.  Hematological: Negative.   Psychiatric/Behavioral:  Negative for confusion, hallucinations, sleep disturbance and suicidal ideas.   All other systems reviewed and are negative.       Objective:  BP (!) 163/94   Pulse 72   Temp 98 F (36.7 C)   Ht '5\' 9"'  (1.753 m)   Wt 209 lb (94.8 kg)   SpO2 96%   BMI 30.86 kg/m    Wt Readings from Last 3 Encounters:  11/05/21 209 lb (94.8 kg)  07/10/21 215 lb (97.5 kg)  06/27/21 213 lb 3.2 oz (96.7 kg)    Physical Exam Vitals and nursing note reviewed.  Constitutional:      General: She is not in acute distress.    Appearance: Normal  appearance. She is well-developed and well-groomed. She is obese. She is not ill-appearing, toxic-appearing or diaphoretic.  HENT:     Head: Normocephalic and atraumatic.     Jaw: There is normal jaw occlusion.     Right Ear: Hearing normal.     Left Ear: Hearing normal.     Nose: Nose normal.     Mouth/Throat:     Lips: Pink.     Mouth: Mucous membranes are moist.     Pharynx: Uvula midline.  Eyes:     General: Lids are normal.     Pupils: Pupils are equal, round, and reactive to light.  Neck:     Thyroid: No thyroid mass, thyromegaly or thyroid tenderness.     Vascular: No carotid bruit or JVD.     Trachea: Trachea and phonation normal.  Cardiovascular:     Rate and Rhythm: Normal rate and regular rhythm.     Chest Wall: PMI is not displaced.     Pulses: Normal pulses.     Heart sounds: Normal heart sounds. No murmur heard.    No friction rub. No gallop.  Pulmonary:     Effort: Pulmonary effort is normal. No respiratory distress.     Breath sounds: Normal breath sounds. No wheezing.  Abdominal:     General: Bowel sounds are normal. There is no distension or abdominal bruit.     Palpations: Abdomen is soft. There is no hepatomegaly or splenomegaly.     Tenderness: There is no abdominal tenderness. There is no right CVA tenderness or left CVA tenderness.     Hernia: No hernia is present.  Musculoskeletal:        General: Normal range of motion.     Cervical back: Normal range of motion and neck supple.     Right lower leg: No edema.     Left lower leg: No edema.  Lymphadenopathy:     Cervical: No cervical adenopathy.  Skin:    General: Skin is warm and dry.     Capillary Refill: Capillary refill takes less than 2 seconds.     Coloration: Skin is not cyanotic, jaundiced or pale.     Findings: No rash.  Neurological:     General: No focal deficit present.     Mental Status: She is alert and oriented to person, place, and time.     Sensory: Sensation is intact.     Motor:  Motor function is intact.     Coordination: Coordination is intact.     Gait:  Gait is intact.     Deep Tendon Reflexes: Reflexes are normal and symmetric.  Psychiatric:        Attention and Perception: Attention and perception normal.        Mood and Affect: Mood and affect normal.        Speech: Speech normal.        Behavior: Behavior normal. Behavior is cooperative.        Thought Content: Thought content normal.        Cognition and Memory: Cognition and memory normal.        Judgment: Judgment normal.     Results for orders placed or performed in visit on 09/23/21  T4, free  Result Value Ref Range   Free T4 1.72 0.82 - 1.77 ng/dL  TSH  Result Value Ref Range   TSH 0.332 (L) 0.450 - 4.500 uIU/mL       Pertinent labs & imaging results that were available during my care of the patient were reviewed by me and considered in my medical decision making.  Assessment & Plan:  Riniyah was seen today for dysuria.  Diagnoses and all orders for this visit:  Dysuria Urinalysis in office: 1+ leukocytes few bacteria, yeast present. Culture pending.  -     Urinalysis, Routine w reflex microscopic -     Urine Culture  Acute cystitis without hematuria Urinalysis as noted. Will initiate keflex and diflucan. Culture pending, will adjust regimen if warranted. No red flags concerning for acute pyelonephritis.  -     cephALEXin (KEFLEX) 500 MG capsule; Take 1 capsule (500 mg total) by mouth 2 (two) times daily. -     fluconazole (DIFLUCAN) 150 MG tablet; 1 po q week x 4 weeks  Vitamin D deficiency Labs pending. Continue repletion therapy. If indicated, will change repletion dosage. Eat foods rich in Vit D including milk, orange juice, yogurt with vitamin D added, salmon or mackerel, canned tuna fish, cereals with vitamin D added, and cod liver oil. Get out in the sun but make sure to wear at least SPF 30 sunscreen.  -     CMP14+EGFR -     CBC with Differential/Platelet -     VITAMIN D 25  Hydroxy (Vit-D Deficiency, Fractures)  Vitamin B12 deficiency Currently on repletion therapy. Last level low, will recheck today and adjust repletion if warranted.  -     CMP14+EGFR -     CBC with Differential/Platelet -     Vitamin B12  Acquired hypothyroidism Thyroid disease has been poorly controlled. Labs are pending. Adjustments to regimen will be made if warranted. Make sure to take medications on an empty stomach with a full glass of water. Make sure to avoid vitamins or supplements for at least 4 hours before and 4 hours after taking medications. Repeat labs in 3 months if adjustments are made and in 6 months if stable.   -     Thyroid Panel With TSH  Essential hypertension Has not taken medications this morning, aware to take once home. DASH diet and exercise encouraged. Monitor BP and report persistent high readings.  -     CMP14+EGFR -     CBC with Differential/Platelet -     Thyroid Panel With TSH     Continue all other maintenance medications.  Follow up plan: Return in about 3 months (around 02/04/2022), or if symptoms worsen or fail to improve.   Continue healthy lifestyle choices, including diet (rich in fruits, vegetables, and lean  proteins, and low in salt and simple carbohydrates) and exercise (at least 30 minutes of moderate physical activity daily).   The above assessment and management plan was discussed with the patient. The patient verbalized understanding of and has agreed to the management plan. Patient is aware to call the clinic if they develop any new symptoms or if symptoms persist or worsen. Patient is aware when to return to the clinic for a follow-up visit. Patient educated on when it is appropriate to go to the emergency department.   Monia Pouch, FNP-C [facility] [phone]

## 2021-11-05 NOTE — Patient Instructions (Signed)
[employer]

## 2021-11-06 ENCOUNTER — Encounter: Payer: Self-pay | Admitting: Family Medicine

## 2021-11-06 LAB — CMP14+EGFR
ALT: 13 IU/L (ref 0–32)
AST: 15 IU/L (ref 0–40)
Albumin/Globulin Ratio: 1.2 (ref 1.2–2.2)
Albumin: 3.8 g/dL — ABNORMAL LOW (ref 3.9–4.9)
Alkaline Phosphatase: 86 IU/L (ref 44–121)
BUN/Creatinine Ratio: 23 (ref 12–28)
BUN: 16 mg/dL (ref 8–27)
Bilirubin Total: 0.3 mg/dL (ref 0.0–1.2)
CO2: 22 mmol/L (ref 20–29)
Calcium: 8.9 mg/dL (ref 8.7–10.3)
Chloride: 105 mmol/L (ref 96–106)
Creatinine, Ser: 0.7 mg/dL (ref 0.57–1.00)
Globulin, Total: 3.1 g/dL (ref 1.5–4.5)
Glucose: 93 mg/dL (ref 70–99)
Potassium: 4.6 mmol/L (ref 3.5–5.2)
Sodium: 140 mmol/L (ref 134–144)
Total Protein: 6.9 g/dL (ref 6.0–8.5)
eGFR: 94 mL/min/{1.73_m2} (ref >59)

## 2021-11-06 LAB — CBC WITH DIFFERENTIAL/PLATELET
Basophils Absolute: 0.1 10*3/uL (ref 0.0–0.2)
Basos: 1 %
EOS (ABSOLUTE): 0.2 10*3/uL (ref 0.0–0.4)
Eos: 4 %
Hematocrit: 40.6 % (ref 34.0–46.6)
Hemoglobin: 13 g/dL (ref 11.1–15.9)
Immature Grans (Abs): 0 10*3/uL (ref 0.0–0.1)
Immature Granulocytes: 0 %
Lymphocytes Absolute: 1.2 10*3/uL (ref 0.7–3.1)
Lymphs: 24 %
MCH: 26.9 pg (ref 26.6–33.0)
MCHC: 32 g/dL (ref 31.5–35.7)
MCV: 84 fL (ref 79–97)
Monocytes Absolute: 0.3 10*3/uL (ref 0.1–0.9)
Monocytes: 6 %
Neutrophils Absolute: 3.2 10*3/uL (ref 1.4–7.0)
Neutrophils: 65 %
Platelets: 234 10*3/uL (ref 150–450)
RBC: 4.84 x10E6/uL (ref 3.77–5.28)
RDW: 14.3 % (ref 11.7–15.4)
WBC: 4.9 10*3/uL (ref 3.4–10.8)

## 2021-11-06 LAB — THYROID PANEL WITH TSH
Free Thyroxine Index: 3.3 (ref 1.2–4.9)
T3 Uptake Ratio: 25 % (ref 24–39)
T4, Total: 13 ug/dL — ABNORMAL HIGH (ref 4.5–12.0)
TSH: 1.79 u[IU]/mL (ref 0.450–4.500)

## 2021-11-06 LAB — VITAMIN D 25 HYDROXY (VIT D DEFICIENCY, FRACTURES): Vit D, 25-Hydroxy: 31.8 ng/mL (ref 30.0–100.0)

## 2021-11-06 LAB — VITAMIN B12: Vitamin B-12: 386 pg/mL (ref 232–1245)

## 2021-11-07 ENCOUNTER — Encounter: Payer: Self-pay | Admitting: Family Medicine

## 2021-11-07 LAB — URINE CULTURE

## 2021-11-07 MED ORDER — LEVOTHYROXINE SODIUM 112 MCG PO TABS: 112.0000 ug | ORAL_TABLET | Freq: Every day | ORAL | 2 refills | Status: DC

## 2021-11-07 MED ORDER — LISINOPRIL 20 MG PO TABS: 20.0000 mg | ORAL_TABLET | Freq: Every day | ORAL | 3 refills | Status: DC

## 2021-11-07 MED ORDER — LOVASTATIN 40 MG PO TABS: 40.0000 mg | ORAL_TABLET | Freq: Every day | ORAL | 3 refills | Status: DC

## 2021-11-07 NOTE — Addendum Note (Signed)
Addended by: Baruch Gouty on: 11/07/2021 03:34 PM   Modules accepted: Orders

## 2021-11-12 ENCOUNTER — Encounter: Payer: Self-pay | Admitting: Family Medicine

## 2021-12-16 ENCOUNTER — Encounter: Payer: Self-pay | Admitting: Family Medicine

## 2022-01-06 ENCOUNTER — Other Ambulatory Visit: Payer: Self-pay | Admitting: Family Medicine

## 2022-01-06 DIAGNOSIS — N3000 Acute cystitis without hematuria: Secondary | ICD-10-CM

## 2022-01-14 ENCOUNTER — Ambulatory Visit (INDEPENDENT_AMBULATORY_CARE_PROVIDER_SITE_OTHER): Payer: Medicare HMO | Admitting: Family Medicine

## 2022-01-14 ENCOUNTER — Encounter: Payer: Self-pay | Admitting: Family Medicine

## 2022-01-14 VITALS — BP 150/84 | HR 87 | Temp 97.5°F | Ht 69.0 in | Wt 211.8 lb

## 2022-01-14 DIAGNOSIS — J0100 Acute maxillary sinusitis, unspecified: Secondary | ICD-10-CM | POA: Diagnosis not present

## 2022-01-14 DIAGNOSIS — M797 Fibromyalgia: Secondary | ICD-10-CM

## 2022-01-14 DIAGNOSIS — E039 Hypothyroidism, unspecified: Secondary | ICD-10-CM

## 2022-01-14 DIAGNOSIS — I10 Essential (primary) hypertension: Secondary | ICD-10-CM

## 2022-01-14 DIAGNOSIS — M5431 Sciatica, right side: Secondary | ICD-10-CM

## 2022-01-14 MED ORDER — AMOXICILLIN-POT CLAVULANATE 875-125 MG PO TABS: 1.0000 | ORAL_TABLET | Freq: Two times a day (BID) | ORAL | 0 refills | Status: AC

## 2022-01-14 MED ORDER — DULOXETINE HCL 30 MG PO CPEP: 30.0000 mg | ORAL_CAPSULE | Freq: Every day | ORAL | 3 refills | Status: DC

## 2022-01-14 MED ORDER — METHYLPREDNISOLONE ACETATE 80 MG/ML IJ SUSP
60.0000 mg | Freq: Once | INTRAMUSCULAR | Status: AC
  Administered 2022-01-14: 60 mg via INTRAMUSCULAR

## 2022-01-14 MED ORDER — METHYLPREDNISOLONE ACETATE 40 MG/ML IJ SUSP: 60.0000 mg | Freq: Once | INTRAMUSCULAR | Status: DC

## 2022-01-14 NOTE — Progress Notes (Signed)
Subjective:  Patient ID: Janet Gardner, female    DOB: 1955/06/01, 66 y.o.   MRN: [id number]  Patient Care Team: Baruch Gouty, FNP as PCP - General (Family Medicine) Gala Romney Cristopher Estimable, MD as Consulting Physician (Gastroenterology) Celestia Khat, [place] (Optometry)   Chief Complaint:  Medical Management of Chronic Issues   HPI: Janet Gardner is a 66 y.o. female presenting on 01/14/2022 for Medical Management of Chronic Issues   1. Essential hypertension Taking medications as prescribed. States well controlled at home. Denies headache, chest pain, visual changes, headaches, weakness, confusion, or leg swelling.   2. Acquired hypothyroidism On repletion therapy and denies hyper- or hypothyroid symptoms. Compliant with medications.   3. Fibromyalgia Was on Cymbalta in the past and tolerated well. Would like to restart as her pain has worsened with the weather changes.    Pt also complains of sinus pressure over the last 2-3 weeks, has been taking an antihistamine and using nasonex without relief of symptoms. She also reports ongoing right sided sciatica. Worse with certain movements. No new injuries, no loss of bowel or bladder. Aching to shooting in nature.    Relevant past medical, surgical, family, and social history reviewed and updated as indicated.  Allergies and medications reviewed and updated. Data reviewed: Chart in Epic.   Past Medical History:  Diagnosis Date   B12 deficiency    monthly injection   Fibromyalgia    GERD (gastroesophageal reflux disease)    Hyperlipidemia    Hypothyroidism    Osteopenia 06/11/2020    Past Surgical History:  Procedure Laterality Date   ABDOMINAL HYSTERECTOMY     CHOLECYSTECTOMY     colonoscopy  2007   Dr. Lavone Neri, incomplete due to poor prep. scope passed to transverse colon. ACBE normal.   ESOPHAGOGASTRODUODENOSCOPY  07/22/2009   [facility]:EQASTM apperaring esophagus s/p 56 dilator/small HH/large ulcerated gastric polyp in the prepyloric  antral area. Inflammatory fibroid polyp.   ESOPHAGOGASTRODUODENOSCOPY (EGD) WITH ESOPHAGEAL DILATION N/A 11/24/2012   Procedure: ESOPHAGOGASTRODUODENOSCOPY (EGD) WITH ESOPHAGEAL DILATION;  Surgeon: Daneil Dolin, MD;  Location: [facility];  Service: Endoscopy;  Laterality: N/A;  9:30AM   FOOT SURGERY     Right   KIDNEY SURGERY      Social History   Socioeconomic History   Marital status: Married    Spouse name: Not on file   Number of children: 1   Years of education: Not on file   Highest education level: Not on file  Occupational History   Occupation: retired    Comment: retired  Tobacco Use   Smoking status: Never   Smokeless tobacco: Never  Scientific laboratory technician Use: Never used  Substance and Sexual Activity   Alcohol use: No   Drug use: No   Sexual activity: Not on file  Other Topics Concern   Not on file  Social History Narrative   Lives with her husband and her 86 year old granddaughter. Her daughter lives in [place] Strain: Low Risk  (06/04/2020)   Overall Financial Resource Strain (CARDIA)    Difficulty of Paying Living Expenses: Not hard at all  Food Insecurity: No Food Insecurity (06/04/2020)   Hunger Vital Sign    Worried About Running Out of Food in the Last Year: Never true    Ran Out of Food in the Last Year: Never true  Transportation Needs: No Transportation Needs (06/04/2020)   PRAPARE -  Hydrologist (Medical): No    Lack of Transportation (Non-Medical): No  Physical Activity: Sufficiently Active (06/05/2021)   Exercise Vital Sign    Days of Exercise per Week: 7 days    Minutes of Exercise per Session: 30 min  Stress: No Stress Concern Present (06/05/2021)   [place]    Feeling of Stress : Not at all  Social Connections: [place] (06/04/2020)   Social Connection and Isolation Panel [NHANES]     Frequency of Communication with Friends and Family: More than three times a week    Frequency of Social Gatherings with Friends and Family: Once a week    Attends Religious Services: More than 4 times per year    Active Member of [employer] or Organizations: Yes    Attends Music therapist: More than 4 times per year    Marital Status: Married  Human resources officer Violence: Not At Risk (06/05/2021)   Humiliation, Afraid, Rape, and Kick questionnaire    Fear of Current or Ex-Partner: No    Emotionally Abused: No    Physically Abused: No    Sexually Abused: No    Outpatient Encounter Medications as of 01/14/2022  Medication Sig   amoxicillin-clavulanate (AUGMENTIN) 875-125 MG tablet Take 1 tablet by mouth 2 (two) times daily for 10 days.   DULoxetine (CYMBALTA) 30 MG capsule Take 1 capsule (30 mg total) by mouth daily.   albuterol (VENTOLIN HFA) 108 (90 Base) MCG/ACT inhaler Inhale 2 puffs into the lungs every 6 (six) hours as needed for wheezing or shortness of breath. (Patient not taking: Reported on 11/05/2021)   levothyroxine (SYNTHROID) 112 MCG tablet Take 1 tablet (112 mcg total) by mouth daily before breakfast.   lisinopril (ZESTRIL) 20 MG tablet Take 1 tablet (20 mg total) by mouth daily.   lovastatin (MEVACOR) 40 MG tablet Take 1 tablet (40 mg total) by mouth at bedtime.   [DISCONTINUED] cephALEXin (KEFLEX) 500 MG capsule Take 1 capsule (500 mg total) by mouth 2 (two) times daily.   [DISCONTINUED] fluconazole (DIFLUCAN) 150 MG tablet 1 po q week x 4 weeks   [EXPIRED] methylPREDNISolone acetate (DEPO-MEDROL) injection 60 mg    [DISCONTINUED] methylPREDNISolone acetate (DEPO-MEDROL) injection 60 mg    No facility-administered encounter medications on file as of 01/14/2022.    Allergies  Allergen Reactions   Azithromycin Other (See Comments)    Caused hematuria.   Levaquin [Levofloxacin] Nausea And Vomiting   Sulfa Antibiotics Hives    Vomiting    Review of Systems   Constitutional:  Negative for activity change, appetite change, chills, diaphoresis, fatigue, fever and unexpected weight change.  HENT:  Positive for congestion, postnasal drip, rhinorrhea, sinus pressure, sinus pain, sore throat and voice change. Negative for dental problem, drooling, ear discharge, ear pain, facial swelling, hearing loss, mouth sores, nosebleeds, sneezing, tinnitus and trouble swallowing.   Eyes: Negative.  Negative for photophobia and visual disturbance.  Respiratory:  Negative for cough, chest tightness and shortness of breath.   Cardiovascular:  Negative for chest pain, palpitations and leg swelling.  Gastrointestinal:  Negative for abdominal pain, blood in stool, constipation, diarrhea, nausea and vomiting.  Endocrine: Negative.  Negative for cold intolerance, heat intolerance, polydipsia, polyphagia and polyuria.  Genitourinary:  Negative for decreased urine volume, difficulty urinating, dysuria, frequency and urgency.  Musculoskeletal:  Positive for arthralgias and back pain. Negative for gait problem, joint swelling, myalgias, neck pain and neck stiffness.  Skin: Negative.   Allergic/Immunologic: Negative.   Neurological:  Negative for dizziness, tremors, seizures, syncope, facial asymmetry, speech difficulty, weakness, light-headedness, numbness and headaches.  Hematological: Negative.   Psychiatric/Behavioral:  Negative for confusion, hallucinations, sleep disturbance and suicidal ideas.   All other systems reviewed and are negative.       Objective:  BP (!) 150/84   Pulse 87   Temp (!) 97.5 F (36.4 C)   Ht _0  (1.753 m)   Wt 211 lb 12.8 oz (96.1 kg)   SpO2 95%   BMI 31.28 kg/m    Wt Readings from Last 3 Encounters:  01/14/22 211 lb 12.8 oz (96.1 kg)  11/05/21 209 lb (94.8 kg)  07/10/21 215 lb (97.5 kg)    Physical Exam Vitals and nursing note reviewed.  Constitutional:      General: She is not in acute distress.    Appearance: Normal  appearance. She is well-developed and well-groomed. She is obese. She is not ill-appearing, toxic-appearing or diaphoretic.  HENT:     Head: Normocephalic and atraumatic.     Jaw: There is normal jaw occlusion.     Right Ear: Hearing, ear canal and external ear normal. A middle ear effusion is present.     Left Ear: Hearing, ear canal and external ear normal. A middle ear effusion is present.     Nose: Congestion present.     Right Turbinates: Enlarged.     Left Turbinates: Enlarged.     Right Sinus: Maxillary sinus tenderness present.     Left Sinus: Maxillary sinus tenderness present.     Mouth/Throat:     Lips: Pink.     Mouth: Mucous membranes are moist.     Pharynx: Oropharynx is clear. Uvula midline. Posterior oropharyngeal erythema present. No pharyngeal swelling, oropharyngeal exudate or uvula swelling.     Tonsils: No tonsillar exudate or tonsillar abscesses.  Eyes:     General: Lids are normal.     Extraocular Movements: Extraocular movements intact.     Conjunctiva/sclera: Conjunctivae normal.     Pupils: Pupils are equal, round, and reactive to light.  Neck:     Thyroid: No thyroid mass, thyromegaly or thyroid tenderness.     Vascular: No carotid bruit or JVD.     Trachea: Trachea and phonation normal.  Cardiovascular:     Rate and Rhythm: Normal rate and regular rhythm.     Chest Wall: PMI is not displaced.     Pulses: Normal pulses.     Heart sounds: Normal heart sounds. No murmur heard.    No friction rub. No gallop.  Pulmonary:     Effort: Pulmonary effort is normal. No respiratory distress.     Breath sounds: Normal breath sounds. No wheezing.  Abdominal:     General: Bowel sounds are normal. There is no distension or abdominal bruit.     Palpations: Abdomen is soft. There is no hepatomegaly or splenomegaly.     Tenderness: There is no abdominal tenderness. There is no right CVA tenderness or left CVA tenderness.     Hernia: No hernia is present.   Musculoskeletal:     Cervical back: Normal range of motion and neck supple.     Thoracic back: Normal.     Lumbar back: No swelling, edema, deformity, signs of trauma, lacerations, spasms, tenderness or bony tenderness. Decreased range of motion. Negative right straight leg raise test and negative left straight leg raise test. No scoliosis.     Right  hip: Normal.     Left hip: Normal.     Right lower leg: No edema.     Left lower leg: No edema.  Lymphadenopathy:     Cervical: No cervical adenopathy.  Skin:    General: Skin is warm and dry.     Capillary Refill: Capillary refill takes less than 2 seconds.     Coloration: Skin is not cyanotic, jaundiced or pale.     Findings: No rash.  Neurological:     General: No focal deficit present.     Mental Status: She is alert and oriented to person, place, and time.     Sensory: Sensation is intact.     Motor: Motor function is intact.     Coordination: Coordination is intact.     Gait: Gait is intact.     Deep Tendon Reflexes: Reflexes are normal and symmetric.  Psychiatric:        Attention and Perception: Attention and perception normal.        Mood and Affect: Mood and affect normal.        Speech: Speech normal.        Behavior: Behavior normal. Behavior is cooperative.        Thought Content: Thought content normal.        Cognition and Memory: Cognition and memory normal.        Judgment: Judgment normal.     Results for orders placed or performed in visit on 11/05/21  Urine Culture   Specimen: Urine   UR  Result Value Ref Range   Urine Culture, Routine Final report    Organism ID, Bacteria Comment   Microscopic Examination   Urine  Result Value Ref Range   WBC, UA 6-10 (A) 0 - 5 /hpf   RBC, Urine None seen 0 - 2 /hpf   Epithelial Cells (non renal) 0-10 0 - 10 /hpf   Renal Epithel, UA None seen None seen /hpf   Mucus, UA Present (A) Not Estab.   Bacteria, UA Few (A) None seen/Few   Yeast, UA Present (A) None seen   Urinalysis, Routine w reflex microscopic  Result Value Ref Range   Specific Gravity, UA 1.025 1.005 - 1.030   pH, UA 5.5 5.0 - 7.5   Color, UA Yellow Yellow   Appearance Ur Clear Clear   Leukocytes,UA 1+ (A) Negative   Protein,UA Negative Negative/Trace   Glucose, UA Negative Negative   Ketones, UA Negative Negative   RBC, UA Negative Negative   Bilirubin, UA Negative Negative   Urobilinogen, Ur 0.2 0.2 - 1.0 mg/dL   Nitrite, UA Negative Negative   Microscopic Examination See below:   CMP14+EGFR  Result Value Ref Range   Glucose 93 70 - 99 mg/dL   BUN 16 8 - 27 mg/dL   Creatinine, Ser 0.70 0.57 - 1.00 mg/dL   eGFR 94 >59 mL/min/1.73   BUN/Creatinine Ratio 23 12 - 28   Sodium 140 134 - 144 mmol/L   Potassium 4.6 3.5 - 5.2 mmol/L   Chloride 105 96 - 106 mmol/L   CO2 22 20 - 29 mmol/L   Calcium 8.9 8.7 - 10.3 mg/dL   Total Protein 6.9 6.0 - 8.5 g/dL   Albumin 3.8 (L) 3.9 - 4.9 g/dL   Globulin, Total 3.1 1.5 - 4.5 g/dL   Albumin/Globulin Ratio 1.2 1.2 - 2.2   Bilirubin Total 0.3 0.0 - 1.2 mg/dL   Alkaline Phosphatase 86 44 - 121 IU/L  AST 15 0 - 40 IU/L   ALT 13 0 - 32 IU/L  CBC with Differential/Platelet  Result Value Ref Range   WBC 4.9 3.4 - 10.8 x10E3/uL   RBC 4.84 3.77 - 5.28 x10E6/uL   Hemoglobin 13.0 11.1 - 15.9 g/dL   Hematocrit 40.6 34.0 - 46.6 %   MCV 84 79 - 97 fL   MCH 26.9 26.6 - 33.0 pg   MCHC 32.0 31.5 - 35.7 g/dL   RDW 14.3 11.7 - 15.4 %   Platelets 234 150 - 450 x10E3/uL   Neutrophils 65 Not Estab. %   Lymphs 24 Not Estab. %   Monocytes 6 Not Estab. %   Eos 4 Not Estab. %   Basos 1 Not Estab. %   Neutrophils Absolute 3.2 1.4 - 7.0 x10E3/uL   Lymphocytes Absolute 1.2 0.7 - 3.1 x10E3/uL   Monocytes Absolute 0.3 0.1 - 0.9 x10E3/uL   EOS (ABSOLUTE) 0.2 0.0 - 0.4 x10E3/uL   Basophils Absolute 0.1 0.0 - 0.2 x10E3/uL   Immature Granulocytes 0 Not Estab. %   Immature Grans (Abs) 0.0 0.0 - 0.1 x10E3/uL  Vitamin B12  Result Value Ref Range   Vitamin  B-12 386 232 - 1,245 pg/mL  VITAMIN D 25 Hydroxy (Vit-D Deficiency, Fractures)  Result Value Ref Range   Vit D, 25-Hydroxy 31.8 30.0 - 100.0 ng/mL  Thyroid Panel With TSH  Result Value Ref Range   TSH 1.790 0.450 - 4.500 uIU/mL   T4, Total 13.0 (H) 4.5 - 12.0 ug/dL   T3 Uptake Ratio 25 24 - 39 %   Free Thyroxine Index 3.3 1.2 - 4.9       Pertinent labs & imaging results that were available during my care of the patient were reviewed by me and considered in my medical decision making.  Assessment & Plan:  Maleyah was seen today for medical management of chronic issues.  Diagnoses and all orders for this visit:  Essential hypertension BP fairly controlled. Changes were not made in regimen today. Goal BP is 130/80. Pt aware to report any persistent high or low readings. DASH diet and exercise encouraged. Exercise at least 150 minutes per week and increase as tolerated. Goal BMI > 25. Stress management encouraged. Avoid nicotine and tobacco product use. Avoid excessive alcohol and NSAID's. Avoid more than 2000 mg of sodium daily. Medications as prescribed. Follow up as scheduled.   Acquired hypothyroidism Has been well controlled. Will repeat labs at next visit and adjust regimen if warranted.   Fibromyalgia Has not been taking Cymbalta. Feels this was beneficial in the past and would like to restart. Will restart today. Follow up in 3 months to for reevaluation.  -     DULoxetine (CYMBALTA) 30 MG capsule; Take 1 capsule (30 mg total) by mouth daily.  Acute non-recurrent maxillary sinusitis Has tried and failed symptomatic care at home. Will add below. Continue Nasonex and antihistamine. Increase water intake.  -     amoxicillin-clavulanate (AUGMENTIN) 875-125 MG tablet; Take 1 tablet by mouth 2 (two) times daily for 10 days.  Sciatica of right side No red flags concerning for cauda equina syndrome. Will burst with steroids. Aware of rehab exercises to completed at home. Report, new  worsening, or persistent symptoms.  -     methylPREDNISolone acetate (DEPO-MEDROL) injection 60 mg     Continue all other maintenance medications.  Follow up plan: Return in about 3 months (around 04/15/2022), or if symptoms worsen or fail to improve, for  chroic follow up with labs.   Continue healthy lifestyle choices, including diet (rich in fruits, vegetables, and lean proteins, and low in salt and simple carbohydrates) and exercise (at least 30 minutes of moderate physical activity daily).  Educational handout given for sciatica  The above assessment and management plan was discussed with the patient. The patient verbalized understanding of and has agreed to the management plan. Patient is aware to call the clinic if they develop any new symptoms or if symptoms persist or worsen. Patient is aware when to return to the clinic for a follow-up visit. Patient educated on when it is appropriate to go to the emergency department.   Monia Pouch, FNP-C [facility] [phone]

## 2022-01-26 ENCOUNTER — Encounter: Payer: Self-pay | Admitting: Family Medicine

## 2022-02-04 ENCOUNTER — Ambulatory Visit: Payer: Medicare HMO | Admitting: Family Medicine

## 2022-02-05 ENCOUNTER — Encounter: Payer: Self-pay | Admitting: Family Medicine

## 2022-02-19 ENCOUNTER — Encounter: Payer: Self-pay | Admitting: Family Medicine

## 2022-02-19 DIAGNOSIS — E559 Vitamin D deficiency, unspecified: Secondary | ICD-10-CM

## 2022-02-19 DIAGNOSIS — E538 Deficiency of other specified B group vitamins: Secondary | ICD-10-CM

## 2022-02-19 DIAGNOSIS — E039 Hypothyroidism, unspecified: Secondary | ICD-10-CM

## 2022-02-20 ENCOUNTER — Other Ambulatory Visit: Payer: Medicare HMO

## 2022-02-20 DIAGNOSIS — E559 Vitamin D deficiency, unspecified: Secondary | ICD-10-CM

## 2022-02-20 DIAGNOSIS — E039 Hypothyroidism, unspecified: Secondary | ICD-10-CM | POA: Diagnosis not present

## 2022-02-20 DIAGNOSIS — E538 Deficiency of other specified B group vitamins: Secondary | ICD-10-CM

## 2022-02-21 LAB — THYROID PANEL WITH TSH
Free Thyroxine Index: 3.3 (ref 1.2–4.9)
T3 Uptake Ratio: 28 % (ref 24–39)
T4, Total: 11.7 ug/dL (ref 4.5–12.0)
TSH: 2.38 u[IU]/mL (ref 0.450–4.500)

## 2022-02-21 LAB — VITAMIN B12: Vitamin B-12: 376 pg/mL (ref 232–1245)

## 2022-02-21 LAB — VITAMIN D 25 HYDROXY (VIT D DEFICIENCY, FRACTURES): Vit D, 25-Hydroxy: 26.9 ng/mL — ABNORMAL LOW (ref 30.0–100.0)

## 2022-02-26 ENCOUNTER — Encounter: Payer: Self-pay | Admitting: Family Medicine

## 2022-03-04 ENCOUNTER — Encounter: Payer: Self-pay | Admitting: Family Medicine

## 2022-03-04 ENCOUNTER — Ambulatory Visit (INDEPENDENT_AMBULATORY_CARE_PROVIDER_SITE_OTHER): Payer: Medicare HMO | Admitting: Family Medicine

## 2022-03-04 VITALS — BP 158/79 | HR 73 | Temp 94.8°F | Ht 69.0 in | Wt 211.8 lb

## 2022-03-04 DIAGNOSIS — M62838 Other muscle spasm: Secondary | ICD-10-CM | POA: Diagnosis not present

## 2022-03-04 MED ORDER — CYCLOBENZAPRINE HCL 5 MG PO TABS: 5.0000 mg | ORAL_TABLET | Freq: Three times a day (TID) | ORAL | 0 refills | Status: DC | PRN

## 2022-03-04 NOTE — Progress Notes (Signed)
Subjective:  Patient ID: Janet Gardner, female    DOB: 01/02/1956, 67 y.o.   MRN: [id number]  Patient Care Team: Baruch Gouty, FNP as PCP - General (Family Medicine) Gala Romney Cristopher Estimable, MD as Consulting Physician (Gastroenterology) Celestia Khat, [place] (Optometry)   Chief Complaint:  Back Pain (Upper back pain near should blade x 1 week )   HPI: KEEANA Gardner is a 67 y.o. female presenting on 03/04/2022 for Back Pain (Upper back pain near should blade x 1 week )   Bilateral shoulder pain after sweeping floors a few days ago. States she has stiffness in her shoulders which is worse with movement. No other associated symptoms. Has tried icy hot and tylenol without relief of symptoms.   Shoulder Pain  The pain is present in the left shoulder and right shoulder. This is a new problem. The current episode started 1 to 4 weeks ago. The problem occurs constantly. The problem has been waxing and waning. The quality of the pain is described as aching and burning (stiffness). The pain is at a severity of 6/10. The pain is moderate. Associated symptoms include a limited range of motion and stiffness. Pertinent negatives include no fever, inability to bear weight, itching, joint locking, joint swelling, numbness or tingling. The symptoms are aggravated by activity. She has tried acetaminophen and heat for the symptoms. The treatment provided no relief.       Relevant past medical, surgical, family, and social history reviewed and updated as indicated.  Allergies and medications reviewed and updated. Data reviewed: Chart in Epic.   Past Medical History:  Diagnosis Date   B12 deficiency    monthly injection   Fibromyalgia    GERD (gastroesophageal reflux disease)    Hyperlipidemia    Hypothyroidism    Osteopenia 06/11/2020    Past Surgical History:  Procedure Laterality Date   ABDOMINAL HYSTERECTOMY     CHOLECYSTECTOMY     colonoscopy  2007   Dr. Lavone Neri, incomplete due to poor prep. scope  passed to transverse colon. ACBE normal.   ESOPHAGOGASTRODUODENOSCOPY  07/22/2009   RKY:HCWCBJ apperaring esophagus s/p 56 dilator/small HH/large ulcerated gastric polyp in the prepyloric antral area. Inflammatory fibroid polyp.   ESOPHAGOGASTRODUODENOSCOPY (EGD) WITH ESOPHAGEAL DILATION N/A 11/24/2012   Procedure: ESOPHAGOGASTRODUODENOSCOPY (EGD) WITH ESOPHAGEAL DILATION;  Surgeon: Daneil Dolin, MD;  Location: [facility];  Service: Endoscopy;  Laterality: N/A;  9:30AM   FOOT SURGERY     Right   KIDNEY SURGERY      Social History   Socioeconomic History   Marital status: Married    Spouse name: Not on file   Number of children: 1   Years of education: Not on file   Highest education level: Not on file  Occupational History   Occupation: retired    Comment: retired  Tobacco Use   Smoking status: Never   Smokeless tobacco: Never  Scientific laboratory technician Use: Never used  Substance and Sexual Activity   Alcohol use: No   Drug use: No   Sexual activity: Not on file  Other Topics Concern   Not on file  Social History Narrative   Lives with her husband and her 70 year old granddaughter. Her daughter lives in [place] Strain: Low Risk  (06/04/2020)   Overall Financial Resource Strain (CARDIA)    Difficulty of Paying Living Expenses: Not hard at all  Food Insecurity: No  Food Insecurity (06/04/2020)   Hunger Vital Sign    Worried About Running Out of Food in the Last Year: Never true    Ran Out of Food in the Last Year: Never true  Transportation Needs: No Transportation Needs (06/04/2020)   PRAPARE - Administrator, Civil Service (Medical): No    Lack of Transportation (Non-Medical): No  Physical Activity: Sufficiently Active (06/05/2021)   Exercise Vital Sign    Days of Exercise per Week: 7 days    Minutes of Exercise per Session: 30 min  Stress: No Stress Concern Present (06/05/2021)   [employer] of  Occupational Health - Occupational Stress Questionnaire    Feeling of Stress : Not at all  Social Connections: Socially Integrated (06/04/2020)   Social Connection and Isolation Panel [NHANES]    Frequency of Communication with Friends and Family: More than three times a week    Frequency of Social Gatherings with Friends and Family: Once a week    Attends Religious Services: More than 4 times per year    Active Member of [employer] or Organizations: Yes    Attends Engineer, structural: More than 4 times per year    Marital Status: Married  Catering manager Violence: Not At Risk (06/05/2021)   Humiliation, Afraid, Rape, and Kick questionnaire    Fear of Current or Ex-Partner: No    Emotionally Abused: No    Physically Abused: No    Sexually Abused: No    Outpatient Encounter Medications as of 03/04/2022  Medication Sig   albuterol (VENTOLIN HFA) 108 (90 Base) MCG/ACT inhaler Inhale 2 puffs into the lungs every 6 (six) hours as needed for wheezing or shortness of breath.   cyclobenzaprine (FLEXERIL) 5 MG tablet Take 1 tablet (5 mg total) by mouth 3 (three) times daily as needed for muscle spasms.   DULoxetine (CYMBALTA) 30 MG capsule Take 1 capsule (30 mg total) by mouth daily.   levothyroxine (SYNTHROID) 112 MCG tablet Take 1 tablet (112 mcg total) by mouth daily before breakfast.   lisinopril (ZESTRIL) 20 MG tablet Take 1 tablet (20 mg total) by mouth daily.   lovastatin (MEVACOR) 40 MG tablet Take 1 tablet (40 mg total) by mouth at bedtime.   No facility-administered encounter medications on file as of 03/04/2022.    Allergies  Allergen Reactions   Azithromycin Other (See Comments)    Caused hematuria.   Levaquin [Levofloxacin] Nausea And Vomiting   Sulfa Antibiotics Hives    Vomiting    Review of Systems  Constitutional:  Negative for activity change, appetite change, chills, diaphoresis, fatigue, fever and unexpected weight change.  HENT: Negative.    Eyes: Negative.   Negative for photophobia and visual disturbance.  Respiratory:  Negative for cough, chest tightness and shortness of breath.   Cardiovascular:  Negative for chest pain, palpitations and leg swelling.  Gastrointestinal:  Negative for abdominal pain, blood in stool, constipation, diarrhea, nausea and vomiting.  Endocrine: Negative.   Genitourinary:  Negative for decreased urine volume, difficulty urinating, dysuria, frequency and urgency.  Musculoskeletal:  Positive for arthralgias, myalgias and stiffness. Negative for back pain, gait problem, joint swelling, neck pain and neck stiffness.  Skin: Negative.  Negative for itching.  Allergic/Immunologic: Negative.   Neurological:  Negative for dizziness, tingling, numbness and headaches.  Hematological: Negative.   Psychiatric/Behavioral:  Negative for confusion, hallucinations, sleep disturbance and suicidal ideas.   All other systems reviewed and are negative.       Objective:  BP (!) 158/79   Pulse 73   Temp (!) 94.8 F (34.9 C) (Temporal)   Ht 5\' 9"  (1.753 m)   Wt 211 lb 12.8 oz (96.1 kg)   SpO2 97%   BMI 31.28 kg/m    Wt Readings from Last 3 Encounters:  03/04/22 211 lb 12.8 oz (96.1 kg)  01/14/22 211 lb 12.8 oz (96.1 kg)  11/05/21 209 lb (94.8 kg)    Physical Exam Constitutional:      General: She is not in acute distress.    Appearance: Normal appearance. She is obese. She is not ill-appearing, toxic-appearing or diaphoretic.  HENT:     Head: Normocephalic.     Nose: Nose normal.     Mouth/Throat:     Mouth: Mucous membranes are moist.  Eyes:     Conjunctiva/sclera: Conjunctivae normal.     Pupils: Pupils are equal, round, and reactive to light.  Cardiovascular:     Rate and Rhythm: Normal rate and regular rhythm.     Heart sounds: Normal heart sounds.  Pulmonary:     Effort: Pulmonary effort is normal.     Breath sounds: Normal breath sounds.  Musculoskeletal:     Right shoulder: Tenderness present. No swelling,  deformity, effusion, laceration, bony tenderness or crepitus. Decreased range of motion. Normal strength. Normal pulse.     Left shoulder: Tenderness present. No swelling, deformity, effusion, laceration, bony tenderness or crepitus. Decreased range of motion. Normal strength. Normal pulse.     Right upper arm: Normal.     Left upper arm: Normal.       Arms:     Cervical back: Spasms present. No swelling, edema, deformity, erythema, signs of trauma, lacerations, rigidity, torticollis, tenderness, bony tenderness or crepitus. No pain with movement. Normal range of motion.     Thoracic back: Normal.     Right lower leg: No edema.     Left lower leg: No edema.  Skin:    General: Skin is warm and dry.     Capillary Refill: Capillary refill takes less than 2 seconds.  Neurological:     General: No focal deficit present.     Mental Status: She is alert and oriented to person, place, and time.  Psychiatric:        Mood and Affect: Mood normal.        Behavior: Behavior normal.        Thought Content: Thought content normal.        Judgment: Judgment normal.     Results for orders placed or performed in visit on 02/20/22  Thyroid Panel With TSH  Result Value Ref Range   TSH 2.380 0.450 - 4.500 uIU/mL   T4, Total 11.7 4.5 - 12.0 ug/dL   T3 Uptake Ratio 28 24 - 39 %   Free Thyroxine Index 3.3 1.2 - 4.9  VITAMIN D 25 Hydroxy (Vit-D Deficiency, Fractures)  Result Value Ref Range   Vit D, 25-Hydroxy 26.9 (L) 30.0 - 100.0 ng/mL  Vitamin B12  Result Value Ref Range   Vitamin B-12 376 232 - 1,245 pg/mL       Pertinent labs & imaging results that were available during my care of the patient were reviewed by me and considered in my medical decision making.  Assessment & Plan:  Giana was seen today for back pain.  Diagnoses and all orders for this visit:  Trapezius muscle spasm Repetitive use injury. Symptomatic care with heat and massage discussed in detail. Will burst  with short course  of muscle relaxers. Sedation precautions discussed. Report new, worsening, or persistent symptoms.  -     cyclobenzaprine (FLEXERIL) 5 MG tablet; Take 1 tablet (5 mg total) by mouth 3 (three) times daily as needed for muscle spasms.     Continue all other maintenance medications.  Follow up plan: Return if symptoms worsen or fail to improve.   Continue healthy lifestyle choices, including diet (rich in fruits, vegetables, and lean proteins, and low in salt and simple carbohydrates) and exercise (at least 30 minutes of moderate physical activity daily).  Educational handout given for muscle strain  The above assessment and management plan was discussed with the patient. The patient verbalized understanding of and has agreed to the management plan. Patient is aware to call the clinic if they develop any new symptoms or if symptoms persist or worsen. Patient is aware when to return to the clinic for a follow-up visit. Patient educated on when it is appropriate to go to the emergency department.   Kari Baars, FNP-C [facility] [phone]

## 2022-04-23 ENCOUNTER — Ambulatory Visit: Payer: Medicare HMO | Admitting: Family Medicine

## 2022-05-19 ENCOUNTER — Ambulatory Visit (INDEPENDENT_AMBULATORY_CARE_PROVIDER_SITE_OTHER): Payer: Medicare HMO | Admitting: Family Medicine

## 2022-05-19 ENCOUNTER — Encounter: Payer: Self-pay | Admitting: Family Medicine

## 2022-05-19 VITALS — BP 146/77 | HR 70 | Temp 97.2°F | Ht 69.0 in | Wt 212.0 lb

## 2022-05-19 DIAGNOSIS — E538 Deficiency of other specified B group vitamins: Secondary | ICD-10-CM | POA: Diagnosis not present

## 2022-05-19 DIAGNOSIS — E039 Hypothyroidism, unspecified: Secondary | ICD-10-CM | POA: Diagnosis not present

## 2022-05-19 DIAGNOSIS — I10 Essential (primary) hypertension: Secondary | ICD-10-CM

## 2022-05-19 DIAGNOSIS — E559 Vitamin D deficiency, unspecified: Secondary | ICD-10-CM

## 2022-05-19 DIAGNOSIS — E782 Mixed hyperlipidemia: Secondary | ICD-10-CM | POA: Diagnosis not present

## 2022-05-19 NOTE — Progress Notes (Signed)
Subjective:  Patient ID: Janet Gardner, female    DOB: 1955-08-16, 67 y.o.   MRN: [id number]  Patient Care Team: Sonny Masters, FNP as PCP - General (Family Medicine) Jena Gauss Gerrit Friends, MD as Consulting Physician (Gastroenterology) Delora Fuel, OD (Optometry)   Chief Complaint:  Medical Management of Chronic Issues (3 month chronic follow up )   HPI: Janet Gardner is a 67 y.o. female presenting on 05/19/2022 for Medical Management of Chronic Issues (3 month chronic follow up )   1. Primary hypertension Complaint with meds - Yes Current Medications - lisinopril Checking BP at home - no Exercising Regularly - No Watching Salt intake - Yes Pertinent ROS:  Headache - No Fatigue - No Visual Disturbances - No Chest pain - No Dyspnea - No Palpitations - No LE edema - No They report good compliance with medications and can restate their regimen by memory. No medication side effects.  BP Readings from Last 3 Encounters:  05/19/22 (!) 146/77  03/04/22 (!) 158/79  01/14/22 (!) 150/84     2. Acquired hypothyroidism Compliant with medications - yes Current medications - Synthroid 112 mcg Adverse side effects - no Weight - stable  Bowel habit changes - No Heat or cold intolerance - No Mood changes - No Changes in sleep habits - No Fatigue - No Skin, hair, or nail changes - No Tremor - No Palpitations - No Edema - No Shortness of breath - No  3. Mixed hyperlipidemia Compliant with medications - No Current medications - was on mevacor but stopped  Side effects from medications - myalgias Diet - general, does not follow a strict diet Exercise - no   4. Vitamin B12 deficiency On repletion therapy and tolerating well. No paresthesias or neuropathy symptoms.   5. Vitamin D deficiency Pt is taking oral repletion therapy. Denies bone pain and tenderness, muscle weakness, fracture, and difficulty walking. Lab Results  Component Value Date   VD25OH 26.9 (L) 02/20/2022    VD25OH 31.8 11/05/2021   VD25OH 30.8 07/10/2021   Lab Results  Component Value Date   CALCIUM 8.9 11/05/2021         Relevant past medical, surgical, family, and social history reviewed and updated as indicated.  Allergies and medications reviewed and updated. Data reviewed: Chart in Epic.   Past Medical History:  Diagnosis Date   B12 deficiency    monthly injection   Fibromyalgia    GERD (gastroesophageal reflux disease)    Hyperlipidemia    Hypothyroidism    Osteopenia 06/11/2020    Past Surgical History:  Procedure Laterality Date   ABDOMINAL HYSTERECTOMY     CHOLECYSTECTOMY     colonoscopy  2007   Dr. Laurell Josephs, incomplete due to poor prep. scope passed to transverse colon. ACBE normal.   ESOPHAGOGASTRODUODENOSCOPY  07/22/2009   AES:LPNPYY apperaring esophagus s/p 56 dilator/small HH/large ulcerated gastric polyp in the prepyloric antral area. Inflammatory fibroid polyp.   ESOPHAGOGASTRODUODENOSCOPY (EGD) WITH ESOPHAGEAL DILATION N/A 11/24/2012   Procedure: ESOPHAGOGASTRODUODENOSCOPY (EGD) WITH ESOPHAGEAL DILATION;  Surgeon: Corbin Ade, MD;  Location: [facility];  Service: Endoscopy;  Laterality: N/A;  9:30AM   FOOT SURGERY     Right   KIDNEY SURGERY      Social History   Socioeconomic History   Marital status: Married    Spouse name: Not on file   Number of children: 1   Years of education: Not on file   Highest education level: Not on  file  Occupational History   Occupation: retired    Comment: retired  Tobacco Use   Smoking status: Never   Smokeless tobacco: Never  Vaping Use   Vaping Use: Never used  Substance and Sexual Activity   Alcohol use: No   Drug use: No   Sexual activity: Not on file  Other Topics Concern   Not on file  Social History Narrative   Lives with her husband and her 73 year old granddaughter. Her daughter lives in [place]   Social Determinants of Health   Financial Resource Strain: Low Risk  (06/04/2020)   Overall  Financial Resource Strain (CARDIA)    Difficulty of Paying Living Expenses: Not hard at all  Food Insecurity: No Food Insecurity (06/04/2020)   Hunger Vital Sign    Worried About Running Out of Food in the Last Year: Never true    Ran Out of Food in the Last Year: Never true  Transportation Needs: No Transportation Needs (06/04/2020)   PRAPARE - Administrator, Civil Service (Medical): No    Lack of Transportation (Non-Medical): No  Physical Activity: Sufficiently Active (06/05/2021)   Exercise Vital Sign    Days of Exercise per Week: 7 days    Minutes of Exercise per Session: 30 min  Stress: No Stress Concern Present (06/05/2021)   [employer] of Occupational Health - Occupational Stress Questionnaire    Feeling of Stress : Not at all  Social Connections: Socially Integrated (06/04/2020)   Social Connection and Isolation Panel [NHANES]    Frequency of Communication with Friends and Family: More than three times a week    Frequency of Social Gatherings with Friends and Family: Once a week    Attends Religious Services: More than 4 times per year    Active Member of [employer] or Organizations: Yes    Attends Engineer, structural: More than 4 times per year    Marital Status: Married  Catering manager Violence: Not At Risk (06/05/2021)   Humiliation, Afraid, Rape, and Kick questionnaire    Fear of Current or Ex-Partner: No    Emotionally Abused: No    Physically Abused: No    Sexually Abused: No    Outpatient Encounter Medications as of 05/19/2022  Medication Sig   cyclobenzaprine (FLEXERIL) 5 MG tablet Take 1 tablet (5 mg total) by mouth 3 (three) times daily as needed for muscle spasms.   DULoxetine (CYMBALTA) 30 MG capsule Take 1 capsule (30 mg total) by mouth daily.   levothyroxine (SYNTHROID) 112 MCG tablet Take 1 tablet (112 mcg total) by mouth daily before breakfast.   lisinopril (ZESTRIL) 20 MG tablet Take 1 tablet (20 mg total) by mouth daily.    [DISCONTINUED] albuterol (VENTOLIN HFA) 108 (90 Base) MCG/ACT inhaler Inhale 2 puffs into the lungs every 6 (six) hours as needed for wheezing or shortness of breath. (Patient not taking: Reported on 05/19/2022)   [DISCONTINUED] lovastatin (MEVACOR) 40 MG tablet Take 1 tablet (40 mg total) by mouth at bedtime. (Patient not taking: Reported on 05/19/2022)   No facility-administered encounter medications on file as of 05/19/2022.    Allergies  Allergen Reactions   Azithromycin Other (See Comments)    Caused hematuria.   Levaquin [Levofloxacin] Nausea And Vomiting   Sulfa Antibiotics Hives    Vomiting    Review of Systems  Constitutional:  Negative for activity change, appetite change, chills, diaphoresis, fatigue, fever and unexpected weight change.  HENT: Negative.    Eyes: Negative.  Negative for photophobia and visual disturbance.  Respiratory:  Negative for cough, chest tightness and shortness of breath.   Cardiovascular:  Negative for chest pain, palpitations and leg swelling.  Gastrointestinal:  Negative for abdominal pain, blood in stool, constipation, diarrhea, nausea and vomiting.  Endocrine: Negative.  Negative for cold intolerance, heat intolerance, polydipsia, polyphagia and polyuria.  Genitourinary:  Negative for decreased urine volume, difficulty urinating, dysuria, frequency and urgency.  Musculoskeletal:  Positive for arthralgias and myalgias. Negative for back pain, gait problem, joint swelling and neck pain.  Skin: Negative.   Allergic/Immunologic: Negative.   Neurological:  Negative for dizziness, tremors, seizures, syncope, facial asymmetry, speech difficulty, weakness, light-headedness, numbness and headaches.  Hematological: Negative.   Psychiatric/Behavioral:  Negative for confusion, hallucinations, sleep disturbance and suicidal ideas.   All other systems reviewed and are negative.       Objective:  BP (!) 146/77   Pulse 70   Temp (!) 97.2 F (36.2 C) (Temporal)    Ht 5\' 9"  (1.753 m)   Wt 212 lb (96.2 kg)   SpO2 99%   BMI 31.31 kg/m    Wt Readings from Last 3 Encounters:  05/19/22 212 lb (96.2 kg)  03/04/22 211 lb 12.8 oz (96.1 kg)  01/14/22 211 lb 12.8 oz (96.1 kg)    Physical Exam Vitals and nursing note reviewed.  Constitutional:      General: She is not in acute distress.    Appearance: Normal appearance. She is well-developed and well-groomed. She is obese. She is not ill-appearing, toxic-appearing or diaphoretic.  HENT:     Head: Normocephalic and atraumatic.     Jaw: There is normal jaw occlusion.     Right Ear: Hearing normal.     Left Ear: Hearing normal.     Nose: Nose normal.     Mouth/Throat:     Lips: Pink.     Mouth: Mucous membranes are moist.     Pharynx: Oropharynx is clear. Uvula midline.  Eyes:     General: Lids are normal.     Extraocular Movements: Extraocular movements intact.     Conjunctiva/sclera: Conjunctivae normal.     Pupils: Pupils are equal, round, and reactive to light.  Neck:     Thyroid: No thyroid mass, thyromegaly or thyroid tenderness.     Vascular: No carotid bruit or JVD.     Trachea: Trachea and phonation normal.  Cardiovascular:     Rate and Rhythm: Normal rate and regular rhythm.     Chest Wall: PMI is not displaced.     Pulses: Normal pulses.     Heart sounds: Normal heart sounds. No murmur heard.    No friction rub. No gallop.  Pulmonary:     Effort: Pulmonary effort is normal. No respiratory distress.     Breath sounds: Normal breath sounds. No wheezing.  Abdominal:     General: Bowel sounds are normal. There is no distension or abdominal bruit.     Palpations: Abdomen is soft. There is no hepatomegaly or splenomegaly.     Tenderness: There is no abdominal tenderness. There is no right CVA tenderness or left CVA tenderness.     Hernia: No hernia is present.  Musculoskeletal:        General: Normal range of motion.     Cervical back: Normal range of motion and neck supple.      Right lower leg: No edema.     Left lower leg: No edema.  Lymphadenopathy:  Cervical: No cervical adenopathy.  Skin:    General: Skin is warm and dry.     Capillary Refill: Capillary refill takes less than 2 seconds.     Coloration: Skin is not cyanotic, jaundiced or pale.     Findings: No rash.  Neurological:     General: No focal deficit present.     Mental Status: She is alert and oriented to person, place, and time.     Sensory: Sensation is intact.     Motor: Motor function is intact.     Coordination: Coordination is intact.     Gait: Gait is intact.     Deep Tendon Reflexes: Reflexes are normal and symmetric.  Psychiatric:        Attention and Perception: Attention and perception normal.        Mood and Affect: Mood and affect normal.        Speech: Speech normal.        Behavior: Behavior normal. Behavior is cooperative.        Thought Content: Thought content normal.        Cognition and Memory: Cognition and memory normal.        Judgment: Judgment normal.     Results for orders placed or performed in visit on 02/20/22  Thyroid Panel With TSH  Result Value Ref Range   TSH 2.380 0.450 - 4.500 uIU/mL   T4, Total 11.7 4.5 - 12.0 ug/dL   T3 Uptake Ratio 28 24 - 39 %   Free Thyroxine Index 3.3 1.2 - 4.9  VITAMIN D 25 Hydroxy (Vit-D Deficiency, Fractures)  Result Value Ref Range   Vit D, 25-Hydroxy 26.9 (L) 30.0 - 100.0 ng/mL  Vitamin B12  Result Value Ref Range   Vitamin B-12 376 232 - 1,245 pg/mL       Pertinent labs & imaging results that were available during my care of the patient were reviewed by me and considered in my medical decision making.  Assessment & Plan:  Cortni was seen today for medical management of chronic issues.  Diagnoses and all orders for this visit:  Primary hypertension BP fairly controlled. Changes were not made in regimen today. Goal BP is 130/80. Pt aware to report any persistent high or low readings. DASH diet and exercise  encouraged. Exercise at least 150 minutes per week and increase as tolerated. Goal BMI > 25. Stress management encouraged. Avoid nicotine and tobacco product use. Avoid excessive alcohol and NSAID's. Avoid more than 2000 mg of sodium daily. Medications as prescribed. Follow up as scheduled.  -     CBC with Differential/Platelet -     CMP14+EGFR -     Lipid panel -     Thyroid Panel With TSH  Acquired hypothyroidism Thyroid disease has been well controlled. Labs are pending. Adjustments to regimen will be made if warranted. Make sure to take medications on an empty stomach with a full glass of water. Make sure to avoid vitamins or supplements for at least 4 hours before and 4 hours after taking medications. Repeat labs in 3 months if adjustments are made and in 6 months if stable.   -     Thyroid Panel With TSH  Mixed hyperlipidemia Has not been taking Mevacor. Will trial Red Yeast Rice 2400 mg daily along with diet and exercise. Labs pending.  -     CMP14+EGFR -     Lipid panel  Vitamin B12 deficiency On repletion therapy, will adjust if warranted.  -  Vitamin B12  Vitamin D deficiency Labs pending. Continue repletion therapy. If indicated, will change repletion dosage. Eat foods rich in Vit D including milk, orange juice, yogurt with vitamin D added, salmon or mackerel, canned tuna fish, cereals with vitamin D added, and cod liver oil. Get out in the sun but make sure to wear at least SPF 30 sunscreen.  -     VITAMIN D 25 Hydroxy (Vit-D Deficiency, Fractures)     Continue all other maintenance medications.  Follow up plan: Return in about 6 months (around 11/18/2022) for chronic follow up.   Continue healthy lifestyle choices, including diet (rich in fruits, vegetables, and lean proteins, and low in salt and simple carbohydrates) and exercise (at least 30 minutes of moderate physical activity daily).  Educational handout given for dyslipidemia   The above assessment and  management plan was discussed with the patient. The patient verbalized understanding of and has agreed to the management plan. Patient is aware to call the clinic if they develop any new symptoms or if symptoms persist or worsen. Patient is aware when to return to the clinic for a follow-up visit. Patient educated on when it is appropriate to go to the emergency department.   Kari BaarsMichelle Amamda Curbow, FNP-C [facility] [phone]

## 2022-05-19 NOTE — Patient Instructions (Signed)
Red Yeast Rice 2400 mg daily. 

## 2022-05-21 LAB — CBC WITH DIFFERENTIAL/PLATELET
Basophils Absolute: 0.1 10*3/uL (ref 0.0–0.2)
Basos: 1 %
EOS (ABSOLUTE): 0.2 10*3/uL (ref 0.0–0.4)
Eos: 3 %
Hematocrit: 39.7 % (ref 34.0–46.6)
Hemoglobin: 13 g/dL (ref 11.1–15.9)
Immature Grans (Abs): 0 10*3/uL (ref 0.0–0.1)
Immature Granulocytes: 0 %
Lymphocytes Absolute: 1.2 10*3/uL (ref 0.7–3.1)
Lymphs: 22 %
MCH: 27.1 pg (ref 26.6–33.0)
MCHC: 32.7 g/dL (ref 31.5–35.7)
MCV: 83 fL (ref 79–97)
Monocytes Absolute: 0.4 10*3/uL (ref 0.1–0.9)
Monocytes: 8 %
Neutrophils Absolute: 3.6 10*3/uL (ref 1.4–7.0)
Neutrophils: 66 %
Platelets: 260 10*3/uL (ref 150–450)
RBC: 4.8 x10E6/uL (ref 3.77–5.28)
RDW: 13.3 % (ref 11.7–15.4)
WBC: 5.4 10*3/uL (ref 3.4–10.8)

## 2022-05-21 LAB — LIPID PANEL
Chol/HDL Ratio: 4.7 ratio — ABNORMAL HIGH (ref 0.0–4.4)
Cholesterol, Total: 194 mg/dL (ref 100–199)
HDL: 41 mg/dL (ref >39)
LDL Chol Calc (NIH): 127 mg/dL — ABNORMAL HIGH (ref 0–99)
Triglycerides: 146 mg/dL (ref 0–149)
VLDL Cholesterol Cal: 26 mg/dL (ref 5–40)

## 2022-05-21 LAB — CMP14+EGFR
ALT: 13 IU/L (ref 0–32)
AST: 20 IU/L (ref 0–40)
Albumin/Globulin Ratio: 1.4 (ref 1.2–2.2)
Albumin: 3.9 g/dL (ref 3.9–4.9)
Alkaline Phosphatase: 89 IU/L (ref 44–121)
BUN/Creatinine Ratio: 23 (ref 12–28)
BUN: 14 mg/dL (ref 8–27)
Bilirubin Total: 0.2 mg/dL (ref 0.0–1.2)
CO2: 23 mmol/L (ref 20–29)
Calcium: 8.7 mg/dL (ref 8.7–10.3)
Chloride: 105 mmol/L (ref 96–106)
Creatinine, Ser: 0.61 mg/dL (ref 0.57–1.00)
Globulin, Total: 2.8 g/dL (ref 1.5–4.5)
Glucose: 92 mg/dL (ref 70–99)
Potassium: 4.4 mmol/L (ref 3.5–5.2)
Sodium: 138 mmol/L (ref 134–144)
Total Protein: 6.7 g/dL (ref 6.0–8.5)
eGFR: 97 mL/min/{1.73_m2} (ref >59)

## 2022-05-21 LAB — THYROID PANEL WITH TSH
Free Thyroxine Index: 3.1 (ref 1.2–4.9)
T3 Uptake Ratio: 26 % (ref 24–39)
T4, Total: 12.1 ug/dL — ABNORMAL HIGH (ref 4.5–12.0)
TSH: 1.88 u[IU]/mL (ref 0.450–4.500)

## 2022-05-21 LAB — VITAMIN D 25 HYDROXY (VIT D DEFICIENCY, FRACTURES): Vit D, 25-Hydroxy: 31.4 ng/mL (ref 30.0–100.0)

## 2022-05-21 LAB — VITAMIN B12: Vitamin B-12: 323 pg/mL (ref 232–1245)

## 2022-05-28 ENCOUNTER — Other Ambulatory Visit: Payer: Self-pay

## 2022-05-28 ENCOUNTER — Encounter: Payer: Self-pay | Admitting: Family Medicine

## 2022-05-28 DIAGNOSIS — R319 Hematuria, unspecified: Secondary | ICD-10-CM

## 2022-05-29 ENCOUNTER — Telehealth: Payer: Medicare HMO | Admitting: Family

## 2022-05-29 ENCOUNTER — Encounter: Payer: Self-pay | Admitting: Family

## 2022-05-29 ENCOUNTER — Ambulatory Visit: Payer: Medicare HMO | Admitting: Family

## 2022-05-29 ENCOUNTER — Telehealth (INDEPENDENT_AMBULATORY_CARE_PROVIDER_SITE_OTHER): Payer: Medicare HMO | Admitting: Family

## 2022-05-29 DIAGNOSIS — R319 Hematuria, unspecified: Secondary | ICD-10-CM | POA: Diagnosis not present

## 2022-05-29 LAB — URINALYSIS, COMPLETE
Bilirubin, UA: NEGATIVE
Glucose, UA: NEGATIVE
Ketones, UA: NEGATIVE
Nitrite, UA: NEGATIVE
Protein,UA: NEGATIVE
Specific Gravity, UA: 1.015 (ref 1.005–1.030)
Urobilinogen, Ur: 1 mg/dL (ref 0.2–1.0)
pH, UA: 7 (ref 5.0–7.5)

## 2022-05-29 LAB — MICROSCOPIC EXAMINATION

## 2022-05-29 NOTE — Progress Notes (Signed)
Virtual Visit Consent   Janet Gardner, you are scheduled for a virtual visit with a [facility] provider today. Just as with appointments in the office, your consent must be obtained to participate. Your consent will be active for this visit and any virtual visit you may have with one of our providers in the next 365 days. If you have a MyChart account, a copy of this consent can be sent to you electronically.  As this is a virtual visit, video technology does not allow for your provider to perform a traditional examination. This may limit your provider's ability to fully assess your condition. If your provider identifies any concerns that need to be evaluated in person or the need to arrange testing (such as labs, EKG, etc.), we will make arrangements to do so. Although advances in technology are sophisticated, we cannot ensure that it will always work on either your end or our end. If the connection with a video visit is poor, the visit may have to be switched to a telephone visit. With either a video or telephone visit, we are not always able to ensure that we have a secure connection.  By engaging in this virtual visit, you consent to the provision of healthcare and authorize for your insurance to be billed (if applicable) for the services provided during this visit. Depending on your insurance coverage, you may receive a charge related to this service.  I need to obtain your verbal consent now. Are you willing to proceed with your visit today? Janet Gardner has provided verbal consent on 05/29/2022 for a virtual visit (video or telephone). Janet Rodney, FNP  Date: 05/29/2022 9:51 AM  Virtual Visit via Video Note   I, Janet Gardner, connected with  Janet Gardner  ([id number], 09/01/1955) on 05/29/22 at  4:30 PM EDT by a video-enabled telemedicine application and verified that I am speaking with the correct person using two identifiers.  Location: Patient: Virtual Visit Location Patient: Other:  restaurant Provider: Virtual Visit Location Provider: Office/Clinic   I discussed the limitations of evaluation and management by telemedicine and the availability of in person appointments. The patient expressed understanding and agreed to proceed.    History of Present Illness: Janet Gardner is a 67 y.o. who identifies as a female who was assigned female at birth, and is being seen today for hematuria yesterday, but resolved.  HPI: Hematuria This is a new problem. The current episode started yesterday. The problem has been resolved since onset. Her pain is at a severity of 0/10. Irritative symptoms include urgency. Irritative symptoms do not include frequency or nocturia. Pertinent negatives include no dysuria, fever, flank pain, hesitancy, nausea or vomiting.    Problems:  Patient Active Problem List   Diagnosis Date Noted   Mixed hyperlipidemia 05/19/2022   Primary hypertension 06/11/2020   Chronic cystitis with hematuria 09/14/2019   Urge incontinence of urine 06/22/2019   PVC (premature ventricular contraction) 05/10/2019   Fibromyalgia 09/02/2018   Vitamin D deficiency 09/02/2018   Vitamin B12 deficiency 09/02/2018   Osteopenia determined by x-ray 09/02/2018   H/O: hysterectomy 09/02/2018   History of esophageal dilatation 09/02/2018   History of cholecystectomy 09/02/2018   Acquired hypothyroidism 06/14/2013   GERD 07/11/2009    Allergies:  Allergies  Allergen Reactions   Azithromycin Other (See Comments)    Caused hematuria.   Levaquin [Levofloxacin] Nausea And Vomiting   Sulfa Antibiotics Hives    Vomiting   Medications:  Current Outpatient Medications:  cyclobenzaprine (FLEXERIL) 5 MG tablet, Take 1 tablet (5 mg total) by mouth 3 (three) times daily as needed for muscle spasms., Disp: 30 tablet, Rfl: 0   DULoxetine (CYMBALTA) 30 MG capsule, Take 1 capsule (30 mg total) by mouth daily., Disp: 90 capsule, Rfl: 3   levothyroxine (SYNTHROID) 112 MCG tablet, Take 1  tablet (112 mcg total) by mouth daily before breakfast., Disp: 90 tablet, Rfl: 2   lisinopril (ZESTRIL) 20 MG tablet, Take 1 tablet (20 mg total) by mouth daily., Disp: 90 tablet, Rfl: 3  Observations/Objective: Patient is well-developed, well-nourished in no acute distress.  Resting comfortably  Head is normocephalic, atraumatic.  No labored breathing.  Speech is clear and coherent with logical content.  Patient is alert and oriented at baseline.    Assessment and Plan: 1. Hematuria, unspecified type  Urine borderline, will hold off on culture  Force fluids All symptoms resolved at this time If blood continues and culture negative may need Urologists referral    Follow Up Instructions: I discussed the assessment and treatment plan with the patient. The patient was provided an opportunity to ask questions and all were answered. The patient agreed with the plan and demonstrated an understanding of the instructions.  A copy of instructions were sent to the patient via MyChart unless otherwise noted below.     The patient was advised to call back or seek an in-person evaluation if the symptoms worsen or if the condition fails to improve as anticipated.  Time:  I spent 9 minutes with the patient via telehealth technology discussing the above problems/concerns.    Janet Rodney, FNP

## 2022-05-31 LAB — URINE CULTURE

## 2022-06-01 ENCOUNTER — Encounter: Payer: Self-pay | Admitting: Family Medicine

## 2022-06-09 ENCOUNTER — Ambulatory Visit (INDEPENDENT_AMBULATORY_CARE_PROVIDER_SITE_OTHER): Payer: Medicare HMO

## 2022-06-09 VITALS — Ht 69.0 in | Wt 210.0 lb

## 2022-06-09 DIAGNOSIS — Z0000 Encounter for general adult medical examination without abnormal findings: Secondary | ICD-10-CM | POA: Diagnosis not present

## 2022-06-09 NOTE — Progress Notes (Signed)
Subjective:   Janet Gardner is a 67 y.o. female who presents for Medicare Annual (Subsequent) preventive examination. I connected with  Rosebud Poles on 06/09/22 by a audio enabled telemedicine application and verified that I am speaking with the correct person using two identifiers.  Patient Location: Home  Provider Location: Home Office  I discussed the limitations of evaluation and management by telemedicine. The patient expressed understanding and agreed to proceed.  Review of Systems     Cardiac Risk Factors include: advanced age (>48men, >70 women);hypertension;dyslipidemia     Objective:    Today's Vitals   06/09/22 0943  Weight: 210 lb (95.3 kg)  Height: 5\' 9"  (1.753 m)   Body mass index is 31.01 kg/m.     06/09/2022    9:45 AM 06/05/2021    9:55 AM 06/04/2020   10:03 AM 06/02/2019    9:35 AM 11/24/2012    8:22 AM  Advanced Directives  Does Patient Have a Medical Advance Directive? No No No No Patient does not have advance directive;Patient would not like information  Would patient like information on creating a medical advance directive? No - Patient declined No - Patient declined Yes (MAU/Ambulatory/Procedural Areas - Information given) No - Patient declined     Current Medications (verified) Outpatient Encounter Medications as of 06/09/2022  Medication Sig   cyclobenzaprine (FLEXERIL) 5 MG tablet Take 1 tablet (5 mg total) by mouth 3 (three) times daily as needed for muscle spasms.   DULoxetine (CYMBALTA) 30 MG capsule Take 1 capsule (30 mg total) by mouth daily.   levothyroxine (SYNTHROID) 112 MCG tablet Take 1 tablet (112 mcg total) by mouth daily before breakfast.   lisinopril (ZESTRIL) 20 MG tablet Take 1 tablet (20 mg total) by mouth daily.   No facility-administered encounter medications on file as of 06/09/2022.    Allergies (verified) Azithromycin, Levaquin [levofloxacin], and Sulfa antibiotics   History: Past Medical History:  Diagnosis Date   B12  deficiency    monthly injection   Fibromyalgia    GERD (gastroesophageal reflux disease)    Hyperlipidemia    Hypothyroidism    Osteopenia 06/11/2020   Past Surgical History:  Procedure Laterality Date   ABDOMINAL HYSTERECTOMY     CHOLECYSTECTOMY     colonoscopy  2007   Dr. Laurell Josephs, incomplete due to poor prep. scope passed to transverse colon. ACBE normal.   ESOPHAGOGASTRODUODENOSCOPY  07/22/2009   BJY:NWGNFA apperaring esophagus s/p 56 dilator/small HH/large ulcerated gastric polyp in the prepyloric antral area. Inflammatory fibroid polyp.   ESOPHAGOGASTRODUODENOSCOPY (EGD) WITH ESOPHAGEAL DILATION N/A 11/24/2012   Procedure: ESOPHAGOGASTRODUODENOSCOPY (EGD) WITH ESOPHAGEAL DILATION;  Surgeon: Corbin Ade, MD;  Location: [facility];  Service: Endoscopy;  Laterality: N/A;  9:30AM   FOOT SURGERY     Right   KIDNEY SURGERY     Family History  Problem Relation Age of Onset   Breast cancer Mother    Heart disease Father    Colon cancer Neg Hx    Social History   Socioeconomic History   Marital status: Married    Spouse name: Not on file   Number of children: 1   Years of education: Not on file   Highest education level: Not on file  Occupational History   Occupation: retired    Comment: retired  Tobacco Use   Smoking status: Never   Smokeless tobacco: Never  Vaping Use   Vaping Use: Never used  Substance and Sexual Activity   Alcohol use:  No   Drug use: No   Sexual activity: Not on file  Other Topics Concern   Not on file  Social History Narrative   Lives with her husband and her 54 year old granddaughter. Her daughter lives in [place]   Social Determinants of Health   Financial Resource Strain: Low Risk  (06/09/2022)   Overall Financial Resource Strain (CARDIA)    Difficulty of Paying Living Expenses: Not hard at all  Food Insecurity: No Food Insecurity (06/09/2022)   Hunger Vital Sign    Worried About Running Out of Food in the Last Year: Never true    Ran  Out of Food in the Last Year: Never true  Transportation Needs: No Transportation Needs (06/09/2022)   PRAPARE - Administrator, Civil Service (Medical): No    Lack of Transportation (Non-Medical): No  Physical Activity: Insufficiently Active (06/09/2022)   Exercise Vital Sign    Days of Exercise per Week: 3 days    Minutes of Exercise per Session: 30 min  Stress: No Stress Concern Present (06/09/2022)   [employer] of Occupational Health - Occupational Stress Questionnaire    Feeling of Stress : Not at all  Social Connections: Socially Integrated (06/09/2022)   Social Connection and Isolation Panel [NHANES]    Frequency of Communication with Friends and Family: More than three times a week    Frequency of Social Gatherings with Friends and Family: More than three times a week    Attends Religious Services: More than 4 times per year    Active Member of [employer] or Organizations: Yes    Attends Engineer, structural: More than 4 times per year    Marital Status: Married    Tobacco Counseling Counseling given: Not Answered   Clinical Intake:  Pre-visit preparation completed: Yes  Pain : No/denies pain     Nutritional Risks: None Diabetes: No  How often do you need to have someone help you when you read instructions, pamphlets, or other written materials from your doctor or pharmacy?: 1 - Never  Diabetic?no   Interpreter Needed?: No  Information entered by :: Renie Ora, LPN   Activities of Daily Living    06/09/2022    9:46 AM  In your present state of health, do you have any difficulty performing the following activities:  Hearing? 0  Vision? 0  Difficulty concentrating or making decisions? 0  Walking or climbing stairs? 0  Dressing or bathing? 0  Doing errands, shopping? 0  Preparing Food and eating ? N  Using the Toilet? N  In the past six months, have you accidently leaked urine? N  Do you have problems with loss of bowel control? N   Managing your Medications? N  Managing your Finances? N  Housekeeping or managing your Housekeeping? N    Patient Care Team: Sonny Masters, FNP as PCP - General (Family Medicine) Jena Gauss Gerrit Friends, MD as Consulting Physician (Gastroenterology) Delora Fuel, OD (Optometry)  Indicate any recent Medical Services you may have received from other than [facility] providers in the past year (date may be approximate).     Assessment:   This is a routine wellness examination for Nandita.  Hearing/Vision screen Vision Screening - Comments:: Wears rx glasses - up to date with routine eye exams with  Dr.Johnson   Dietary issues and exercise activities discussed: Current Exercise Habits: Home exercise routine, Type of exercise: walking, Time (Minutes): 30, Frequency (Times/Week): 3, Weekly Exercise (Minutes/Week): 90, Intensity: Mild, Exercise  limited by: neurologic condition(s)   Goals Addressed             This Visit's Progress    Exercise 150 min/wk Moderate Activity   On track      Depression Screen    06/09/2022    9:45 AM 05/19/2022   11:43 AM 01/14/2022    2:50 PM 11/05/2021    8:17 AM 07/10/2021    2:22 PM 06/27/2021   11:11 AM 06/05/2021    9:50 AM  PHQ 2/9 Scores  PHQ - 2 Score 0 0 0 0 0 0 0  PHQ- 9 Score 0 1  1 0 2 0    Fall Risk    06/09/2022    9:44 AM 05/19/2022   11:43 AM 01/14/2022    2:50 PM 11/05/2021    8:18 AM 07/10/2021    2:22 PM  Fall Risk   Falls in the past year? 0 0 0 0 0  Number falls in past yr: 0      Injury with Fall? 0      Risk for fall due to : No Fall Risks      Follow up Falls prevention discussed        FALL RISK PREVENTION PERTAINING TO THE HOME:  Any stairs in or around the home? Yes  If so, are there any without handrails? No  Home free of loose throw rugs in walkways, pet beds, electrical cords, etc? Yes  Adequate lighting in your home to reduce risk of falls? Yes   ASSISTIVE DEVICES UTILIZED TO PREVENT FALLS:  Life alert? No  Use of a  cane, walker or w/c? No  Grab bars in the bathroom? No  Shower chair or bench in shower? No  Elevated toilet seat or a handicapped toilet? No          06/09/2022    9:46 AM 06/05/2021    9:55 AM 06/02/2019    9:07 AM  6CIT Screen  What Year? 0 points 0 points 0 points  What month? 0 points 0 points 0 points  What time? 0 points 0 points 0 points  Count back from 20 0 points 0 points 0 points  Months in reverse 0 points 0 points 0 points  Repeat phrase 0 points 0 points 0 points  Total Score 0 points 0 points 0 points    Immunizations Immunization History  Administered Date(s) Administered   Fluad Quad(high Dose 65+) 10/13/2018, 10/27/2019, 11/11/2020   Influenza,inj,Quad PF,6+ Mos 11/04/2016, 11/09/2017   Influenza-Unspecified 11/23/2013, 11/21/2014, 11/20/2015, 11/04/2016, 11/09/2017, 10/13/2018, 10/27/2019   Moderna Sars-Covid-2 Vaccination 04/06/2019, 05/05/2019, 03/05/2020   Pneumococcal Conjugate-13 09/02/2018   Tdap 07/15/2015    TDAP status: Up to date  Flu Vaccine status: Up to date  Pneumococcal vaccine status: Up to date  Covid-19 vaccine status: Completed vaccines  Qualifies for Shingles Vaccine? Yes   Zostavax completed No   Shingrix Completed?: No.    Education has been provided regarding the importance of this vaccine. Patient has been advised to call insurance company to determine out of pocket expense if they have not yet received this vaccine. Advised may also receive vaccine at local pharmacy or Health Dept. Verbalized acceptance and understanding.  Screening Tests Health Maintenance  Topic Date Due   COVID-19 Vaccine (4 - 2023-24 season) 10/10/2021   Zoster Vaccines- Shingrix (1 of 2) 08/18/2022 (Originally 04/25/2003)   Pneumonia Vaccine 52+ Years old (2 of 2 - PPSV23 or PCV20) 01/15/2023 (Originally 09/02/2019)  DEXA SCAN  06/12/2022   MAMMOGRAM  06/19/2022   INFLUENZA VACCINE  09/10/2022   Medicare Annual Wellness (AWV)  06/09/2023   Fecal DNA  (Cologuard)  06/26/2023   DTaP/Tdap/Td (2 - Td or Tdap) 07/14/2025   Hepatitis C Screening  Completed   HPV VACCINES  Aged Out   COLONOSCOPY (Pts 45-9yrs Insurance coverage will need to be confirmed)  Discontinued    Health Maintenance  Health Maintenance Due  Topic Date Due   COVID-19 Vaccine (4 - 2023-24 season) 10/10/2021    Colorectal cancer screening: Type of screening: Cologuard. Completed 06/25/2020. Repeat every 3 years  Mammogram status: Completed 06/18/2021. Repeat every year  Bone Density status: Completed 06/11/2020. Results reflect: Bone density results: OSTEOPOROSIS. Repeat every 2 years.  Lung Cancer Screening: (Low Dose CT Chest recommended if Age 43-80 years, 30 pack-year currently smoking OR have quit w/in 15years.) does not qualify.   Lung Cancer Screening Referral: n/a  Additional Screening:  Hepatitis C Screening: does not qualify; Completed 09/11/2016  Vision Screening: Recommended annual ophthalmology exams for early detection of glaucoma and other disorders of the eye. Is the patient up to date with their annual eye exam?  Yes  Who is the provider or what is the name of the office in which the patient attends annual eye exams? Dr.Johnson  If pt is not established with a provider, would they like to be referred to a provider to establish care? No .   Dental Screening: Recommended annual dental exams for proper oral hygiene  Community Resource Referral / Chronic Care Management: CRR required this visit?  No   CCM required this visit?  No      Plan:     I have personally reviewed and noted the following in the patient's chart:   Medical and social history Use of alcohol, tobacco or illicit drugs  Current medications and supplements including opioid prescriptions. Patient is not currently taking opioid prescriptions. Functional ability and status Nutritional status Physical activity Advanced directives List of other  physicians Hospitalizations, surgeries, and ER visits in previous 12 months Vitals Screenings to include cognitive, depression, and falls Referrals and appointments  In addition, I have reviewed and discussed with patient certain preventive protocols, quality metrics, and best practice recommendations. A written personalized care plan for preventive services as well as general preventive health recommendations were provided to patient.     Lorrene Reid, LPN   05/25/6061   Nurse Notes: none

## 2022-06-09 NOTE — Patient Instructions (Signed)
[name] , Thank you for taking time to come for your Medicare Wellness Visit. I appreciate your ongoing commitment to your health goals. Please review the following plan we discussed and let me know if I can assist you in the future.   These are the goals we discussed:  Goals      Exercise 150 min/wk Moderate Activity        This is a list of the screening recommended for you and due dates:  Health Maintenance  Topic Date Due   COVID-19 Vaccine (4 - 2023-24 season) 10/10/2021   Zoster (Shingles) Vaccine (1 of 2) 08/18/2022*   Pneumonia Vaccine (2 of 2 - PPSV23 or PCV20) 01/15/2023*   DEXA scan (bone density measurement)  06/12/2022   Mammogram  06/19/2022   Flu Shot  09/10/2022   Medicare Annual Wellness Visit  06/09/2023   Cologuard (Stool DNA test)  06/26/2023   DTaP/Tdap/Td vaccine (2 - Td or Tdap) 07/14/2025   Hepatitis C Screening: USPSTF Recommendation to screen - Ages 65-79 yo.  Completed   HPV Vaccine  Aged Out   Colon Cancer Screening  Discontinued  *Topic was postponed. The date shown is not the original due date.    Advanced directives: Advance directive discussed with you today. I have provided a copy for you to complete at home and have notarized. Once this is complete please bring a copy in to our office so we can scan it into your chart.   Conditions/risks identified: Aim for 30 minutes of exercise or brisk walking, 6-8 glasses of water, and 5 servings of fruits and vegetables each day.   Next appointment: Follow up in one year for your annual wellness visit    Preventive Care 65 Years and Older, Female Preventive care refers to lifestyle choices and visits with your health care provider that can promote health and wellness. What does preventive care include? A yearly physical exam. This is also called an annual well check. Dental exams once or twice a year. Routine eye exams. Ask your health care provider how often you should have your eyes checked. Personal  lifestyle choices, including: Daily care of your teeth and gums. Regular physical activity. Eating a healthy diet. Avoiding tobacco and drug use. Limiting alcohol use. Practicing safe sex. Taking low-dose aspirin every day. Taking vitamin and mineral supplements as recommended by your health care provider. What happens during an annual well check? The services and screenings done by your health care provider during your annual well check will depend on your age, overall health, lifestyle risk factors, and family history of disease. Counseling  Your health care provider may ask you questions about your: Alcohol use. Tobacco use. Drug use. Emotional well-being. Home and relationship well-being. Sexual activity. Eating habits. History of falls. Memory and ability to understand (cognition). Work and work Astronomer. Reproductive health. Screening  You may have the following tests or measurements: Height, weight, and BMI. Blood pressure. Lipid and cholesterol levels. These may be checked every 5 years, or more frequently if you are over 79 years old. Skin check. Lung cancer screening. You may have this screening every year starting at age 91 if you have a 30-pack-year history of smoking and currently smoke or have quit within the past 15 years. Fecal occult blood test (FOBT) of the stool. You may have this test every year starting at age 52. Flexible sigmoidoscopy or colonoscopy. You may have a sigmoidoscopy every 5 years or a colonoscopy every 10 years starting at  age 94. Hepatitis C blood test. Hepatitis B blood test. Sexually transmitted disease (STD) testing. Diabetes screening. This is done by checking your blood sugar (glucose) after you have not eaten for a while (fasting). You may have this done every 1-3 years. Bone density scan. This is done to screen for osteoporosis. You may have this done starting at age 39. Mammogram. This may be done every 1-2 years. Talk to your  health care provider about how often you should have regular mammograms. Talk with your health care provider about your test results, treatment options, and if necessary, the need for more tests. Vaccines  Your health care provider may recommend certain vaccines, such as: Influenza vaccine. This is recommended every year. Tetanus, diphtheria, and acellular pertussis (Tdap, Td) vaccine. You may need a Td booster every 10 years. Zoster vaccine. You may need this after age 47. Pneumococcal 13-valent conjugate (PCV13) vaccine. One dose is recommended after age 31. Pneumococcal polysaccharide (PPSV23) vaccine. One dose is recommended after age 29. Talk to your health care provider about which screenings and vaccines you need and how often you need them. This information is not intended to replace advice given to you by your health care provider. Make sure you discuss any questions you have with your health care provider. Document Released: 02/22/2015 Document Revised: 10/16/2015 Document Reviewed: 11/27/2014 Elsevier Interactive Patient Education  2017 [place] Prevention in the Home Falls can cause injuries. They can happen to people of all ages. There are many things you can do to make your home safe and to help prevent falls. What can I do on the outside of my home? Regularly fix the edges of walkways and driveways and fix any cracks. Remove anything that might make you trip as you walk through a door, such as a raised step or threshold. Trim any bushes or trees on the path to your home. Use bright outdoor lighting. Clear any walking paths of anything that might make someone trip, such as rocks or tools. Regularly check to see if handrails are loose or broken. Make sure that both sides of any steps have handrails. Any raised decks and porches should have guardrails on the edges. Have any leaves, snow, or ice cleared regularly. Use sand or salt on walking paths during winter. Clean  up any spills in your garage right away. This includes oil or grease spills. What can I do in the bathroom? Use night lights. Install grab bars by the toilet and in the tub and shower. Do not use towel bars as grab bars. Use non-skid mats or decals in the tub or shower. If you need to sit down in the shower, use a plastic, non-slip stool. Keep the floor dry. Clean up any water that spills on the floor as soon as it happens. Remove soap buildup in the tub or shower regularly. Attach bath mats securely with double-sided non-slip rug tape. Do not have throw rugs and other things on the floor that can make you trip. What can I do in the bedroom? Use night lights. Make sure that you have a light by your bed that is easy to reach. Do not use any sheets or blankets that are too big for your bed. They should not hang down onto the floor. Have a firm chair that has side arms. You can use this for support while you get dressed. Do not have throw rugs and other things on the floor that can make you trip. What can I  do in the kitchen? Clean up any spills right away. Avoid walking on wet floors. Keep items that you use a lot in easy-to-reach places. If you need to reach something above you, use a strong step stool that has a grab bar. Keep electrical cords out of the way. Do not use floor polish or wax that makes floors slippery. If you must use wax, use non-skid floor wax. Do not have throw rugs and other things on the floor that can make you trip. What can I do with my stairs? Do not leave any items on the stairs. Make sure that there are handrails on both sides of the stairs and use them. Fix handrails that are broken or loose. Make sure that handrails are as long as the stairways. Check any carpeting to make sure that it is firmly attached to the stairs. Fix any carpet that is loose or worn. Avoid having throw rugs at the top or bottom of the stairs. If you do have throw rugs, attach them to the  floor with carpet tape. Make sure that you have a light switch at the top of the stairs and the bottom of the stairs. If you do not have them, ask someone to add them for you. What else can I do to help prevent falls? Wear shoes that: Do not have high heels. Have rubber bottoms. Are comfortable and fit you well. Are closed at the toe. Do not wear sandals. If you use a stepladder: Make sure that it is fully opened. Do not climb a closed stepladder. Make sure that both sides of the stepladder are locked into place. Ask someone to hold it for you, if possible. Clearly mark and make sure that you can see: Any grab bars or handrails. First and last steps. Where the edge of each step is. Use tools that help you move around (mobility aids) if they are needed. These include: Canes. Walkers. Scooters. Crutches. Turn on the lights when you go into a dark area. Replace any light bulbs as soon as they burn out. Set up your furniture so you have a clear path. Avoid moving your furniture around. If any of your floors are uneven, fix them. If there are any pets around you, be aware of where they are. Review your medicines with your doctor. Some medicines can make you feel dizzy. This can increase your chance of falling. Ask your doctor what other things that you can do to help prevent falls. This information is not intended to replace advice given to you by your health care provider. Make sure you discuss any questions you have with your health care provider. Document Released: 11/22/2008 Document Revised: 07/04/2015 Document Reviewed: 03/02/2014 Elsevier Interactive Patient Education  2017 [employer].

## 2022-06-17 ENCOUNTER — Encounter: Payer: Self-pay | Admitting: Family Medicine

## 2022-06-17 ENCOUNTER — Other Ambulatory Visit: Payer: Self-pay | Admitting: Family Medicine

## 2022-06-17 DIAGNOSIS — M62838 Other muscle spasm: Secondary | ICD-10-CM

## 2022-06-17 MED ORDER — CYCLOBENZAPRINE HCL 5 MG PO TABS
5.0000 mg | ORAL_TABLET | Freq: Three times a day (TID) | ORAL | 0 refills | Status: DC | PRN
Start: 2022-06-17 — End: 2022-10-01

## 2022-07-11 ENCOUNTER — Other Ambulatory Visit: Payer: Self-pay | Admitting: Family Medicine

## 2022-07-11 DIAGNOSIS — E039 Hypothyroidism, unspecified: Secondary | ICD-10-CM

## 2022-07-12 ENCOUNTER — Encounter: Payer: Self-pay | Admitting: Family Medicine

## 2022-07-21 ENCOUNTER — Encounter: Payer: Self-pay | Admitting: Family Medicine

## 2022-07-22 ENCOUNTER — Encounter: Payer: Self-pay | Admitting: Family Medicine

## 2022-08-16 ENCOUNTER — Encounter: Payer: Self-pay | Admitting: Family Medicine

## 2022-09-14 ENCOUNTER — Other Ambulatory Visit (HOSPITAL_COMMUNITY): Payer: Self-pay | Admitting: Family Medicine

## 2022-09-14 DIAGNOSIS — Z1231 Encounter for screening mammogram for malignant neoplasm of breast: Secondary | ICD-10-CM

## 2022-09-21 ENCOUNTER — Ambulatory Visit (HOSPITAL_COMMUNITY)
Admission: RE | Admit: 2022-09-21 | Discharge: 2022-09-21 | Disposition: A | Discharge status: Home / Self Care | Payer: Medicare HMO | Source: Ambulatory Visit | Attending: Family Medicine | Admitting: Family Medicine

## 2022-09-21 DIAGNOSIS — Z1231 Encounter for screening mammogram for malignant neoplasm of breast: Secondary | ICD-10-CM | POA: Insufficient documentation

## 2022-09-23 ENCOUNTER — Other Ambulatory Visit: Payer: Self-pay | Admitting: Family Medicine

## 2022-09-23 DIAGNOSIS — I10 Essential (primary) hypertension: Secondary | ICD-10-CM

## 2022-09-23 DIAGNOSIS — E785 Hyperlipidemia, unspecified: Secondary | ICD-10-CM

## 2022-09-24 ENCOUNTER — Encounter: Payer: Self-pay | Admitting: Family Medicine

## 2022-09-24 NOTE — Telephone Encounter (Signed)
Rakes NTBS in Oct for 6 mos FU. Rf sent to pharmacy

## 2022-10-01 ENCOUNTER — Encounter: Payer: Self-pay | Admitting: Family Medicine

## 2022-10-01 ENCOUNTER — Ambulatory Visit (INDEPENDENT_AMBULATORY_CARE_PROVIDER_SITE_OTHER): Payer: Medicare HMO | Admitting: Family Medicine

## 2022-10-01 VITALS — BP 146/71 | HR 71 | Temp 96.7°F | Ht 69.0 in | Wt 214.4 lb

## 2022-10-01 DIAGNOSIS — R6889 Other general symptoms and signs: Secondary | ICD-10-CM | POA: Diagnosis not present

## 2022-10-01 DIAGNOSIS — M62838 Other muscle spasm: Secondary | ICD-10-CM

## 2022-10-01 DIAGNOSIS — R61 Generalized hyperhidrosis: Secondary | ICD-10-CM

## 2022-10-01 DIAGNOSIS — E039 Hypothyroidism, unspecified: Secondary | ICD-10-CM

## 2022-10-01 MED ORDER — CYCLOBENZAPRINE HCL 5 MG PO TABS
5.0000 mg | ORAL_TABLET | Freq: Three times a day (TID) | ORAL | 1 refills | Status: DC | PRN
Start: 2022-10-01 — End: 2023-07-20

## 2022-10-01 NOTE — Progress Notes (Signed)
Subjective:  Patient ID: Janet Gardner, female    DOB: January 15, 1956, 67 y.o.   MRN: [id number]  Patient Care Team: Corbin Ade, MD as Consulting Physician (Gastroenterology) Delora Fuel, [place] (Optometry)   Chief Complaint:  Medication Refill (Flexeril ) and Hypertension (Patient states that when she takes her lisinopril it makes her feel nauseous and she would like to know if anything else can be sent in. )   HPI: Janet Gardner is a 67 y.o. female presenting on 10/01/2022 for Medication Refill (Flexeril ) and Hypertension (Patient states that when she takes her lisinopril it makes her feel nauseous and she would like to know if anything else can be sent in. )   1. Trapezius muscle spasm Has been taking flexeril as prescribed. Only on an as needed basis and not daily. This works very well for her. She would like a refill today. No adverse side effects from the medications, no sedation. Aware to trial a massage to see if this weill help relieve the spasms.   2. Night sweats This has been intermittent for the last several weeks. She denies any other associated symptoms. No weight changes, pain, changes in bowel habits, cough, hemoptysis, or melena. Has not tried anything to help mitigate the symptoms. Menopausal for several years.    3. Thyroid disease Medications were adjusted after last blood draw due to elevated TSH. Has been having night sweats but denies other symptoms.   Relevant past medical, surgical, family, and social history reviewed and updated as indicated.  Allergies and medications reviewed and updated. Data reviewed: Chart in Epic.   Past Medical History:  Diagnosis Date   B12 deficiency    monthly injection   Fibromyalgia    GERD (gastroesophageal reflux disease)    Hyperlipidemia    Hypothyroidism    Osteopenia 06/11/2020    Past Surgical History:  Procedure Laterality Date   ABDOMINAL HYSTERECTOMY     CHOLECYSTECTOMY     colonoscopy  2007   Dr. Laurell Josephs,  incomplete due to poor prep. scope passed to transverse colon. ACBE normal.   ESOPHAGOGASTRODUODENOSCOPY  07/22/2009   [id number] apperaring esophagus s/p 56 dilator/small HH/large ulcerated gastric polyp in the prepyloric antral area. Inflammatory fibroid polyp.   ESOPHAGOGASTRODUODENOSCOPY (EGD) WITH ESOPHAGEAL DILATION N/A 11/24/2012   Procedure: ESOPHAGOGASTRODUODENOSCOPY (EGD) WITH ESOPHAGEAL DILATION;  Surgeon: Corbin Ade, MD;  Location: [facility];  Service: Endoscopy;  Laterality: N/A;  9:30AM   FOOT SURGERY     Right   KIDNEY SURGERY      Social History   Socioeconomic History   Marital status: Married    Spouse name: Not on file   Number of children: 1   Years of education: Not on file   Highest education level: Not on file  Occupational History   Occupation: retired    Comment: retired  Tobacco Use   Smoking status: Never   Smokeless tobacco: Never  Vaping Use   Vaping status: Never Used  Substance and Sexual Activity   Alcohol use: No   Drug use: No   Sexual activity: Not on file  Other Topics Concern   Not on file  Social History Narrative   Lives with her husband and her 64 year old granddaughter. Her daughter lives in [place]   Social Determinants of Health   Financial Resource Strain: Low Risk  (06/09/2022)   Overall Financial Resource Strain (CARDIA)    Difficulty of Paying Living Expenses: Not hard at  all  Food Insecurity: No Food Insecurity (06/09/2022)   Hunger Vital Sign    Worried About Running Out of Food in the Last Year: Never true    Ran Out of Food in the Last Year: Never true  Transportation Needs: No Transportation Needs (06/09/2022)   PRAPARE - Administrator, Civil Service (Medical): No    Lack of Transportation (Non-Medical): No  Physical Activity: Insufficiently Active (06/09/2022)   Exercise Vital Sign    Days of Exercise per Week: 3 days    Minutes of Exercise per Session: 30 min  Stress: No Stress Concern Present  (06/09/2022)   [employer] of Occupational Health - Occupational Stress Questionnaire    Feeling of Stress : Not at all  Social Connections: Socially Integrated (06/09/2022)   Social Connection and Isolation Panel [NHANES]    Frequency of Communication with Friends and Family: More than three times a week    Frequency of Social Gatherings with Friends and Family: More than three times a week    Attends Religious Services: More than 4 times per year    Active Member of [employer] or Organizations: Yes    Attends Engineer, structural: More than 4 times per year    Marital Status: Married  Catering manager Violence: Not At Risk (06/09/2022)   Humiliation, Afraid, Rape, and Kick questionnaire    Fear of Current or Ex-Partner: No    Emotionally Abused: No    Physically Abused: No    Sexually Abused: No    Outpatient Encounter Medications as of 10/01/2022  Medication Sig   DULoxetine (CYMBALTA) 30 MG capsule Take 1 capsule (30 mg total) by mouth daily.   levothyroxine (SYNTHROID) 112 MCG tablet TAKE 1 TABLET EVERY DAY BEFORE BREAKFAST   lisinopril (ZESTRIL) 20 MG tablet TAKE 1 TABLET EVERY DAY   [DISCONTINUED] cyclobenzaprine (FLEXERIL) 5 MG tablet Take 1 tablet (5 mg total) by mouth 3 (three) times daily as needed for muscle spasms.   cyclobenzaprine (FLEXERIL) 5 MG tablet Take 1 tablet (5 mg total) by mouth 3 (three) times daily as needed for muscle spasms.   No facility-administered encounter medications on file as of 10/01/2022.    Allergies  Allergen Reactions   Azithromycin Other (See Comments)    Caused hematuria.   Levaquin [Levofloxacin] Nausea And Vomiting   Sulfa Antibiotics Hives    Vomiting    Review of Systems  Constitutional:  Positive for diaphoresis. Negative for activity change, appetite change, chills, fatigue, fever and unexpected weight change.  HENT: Negative.    Eyes: Negative.  Negative for photophobia and visual disturbance.  Respiratory:  Negative  for cough, chest tightness and shortness of breath.   Cardiovascular:  Negative for chest pain, palpitations and leg swelling.  Gastrointestinal:  Negative for abdominal distention, abdominal pain, anal bleeding, blood in stool, constipation, diarrhea, nausea, rectal pain and vomiting.  Endocrine: Negative.  Negative for cold intolerance, heat intolerance, polydipsia, polyphagia and polyuria.  Genitourinary:  Negative for decreased urine volume, difficulty urinating, dysuria, frequency and urgency.  Musculoskeletal:  Positive for myalgias. Negative for arthralgias.  Skin: Negative.   Allergic/Immunologic: Negative.   Neurological:  Negative for dizziness, tremors, seizures, syncope, facial asymmetry, speech difficulty, weakness, light-headedness, numbness and headaches.  Hematological: Negative.   Psychiatric/Behavioral:  Negative for confusion, hallucinations, sleep disturbance and suicidal ideas.   All other systems reviewed and are negative.       Objective:  BP (!) 146/71   Pulse 71  Temp (!) 96.7 F (35.9 C) (Temporal)   Ht 5\' 9"  (1.753 m)   Wt 214 lb 6.4 oz (97.3 kg)   SpO2 96%   BMI 31.66 kg/m    Wt Readings from Last 3 Encounters:  10/01/22 214 lb 6.4 oz (97.3 kg)  06/09/22 210 lb (95.3 kg)  05/19/22 212 lb (96.2 kg)    Physical Exam Vitals and nursing note reviewed.  Constitutional:      General: She is not in acute distress.    Appearance: Normal appearance. She is well-developed and well-groomed. She is not ill-appearing, toxic-appearing or diaphoretic.  HENT:     Head: Normocephalic and atraumatic.     Jaw: There is normal jaw occlusion.     Right Ear: Hearing normal.     Left Ear: Hearing normal.     Nose: Nose normal.     Mouth/Throat:     Lips: Pink.     Mouth: Mucous membranes are moist.     Pharynx: Oropharynx is clear. Uvula midline.  Eyes:     General: Lids are normal.     Extraocular Movements: Extraocular movements intact.      Conjunctiva/sclera: Conjunctivae normal.     Pupils: Pupils are equal, round, and reactive to light.  Neck:     Thyroid: No thyroid mass, thyromegaly or thyroid tenderness.     Vascular: No carotid bruit or JVD.     Trachea: Trachea and phonation normal.  Cardiovascular:     Rate and Rhythm: Normal rate and regular rhythm.     Chest Wall: PMI is not displaced.     Pulses: Normal pulses.     Heart sounds: Normal heart sounds. No murmur heard.    No friction rub. No gallop.  Pulmonary:     Effort: Pulmonary effort is normal. No respiratory distress.     Breath sounds: Normal breath sounds. No wheezing.  Abdominal:     General: Bowel sounds are normal. There is no distension or abdominal bruit.     Palpations: Abdomen is soft. There is no hepatomegaly or splenomegaly.     Tenderness: There is no abdominal tenderness. There is no right CVA tenderness or left CVA tenderness.     Hernia: No hernia is present.  Musculoskeletal:        General: Normal range of motion.     Right shoulder: Normal.     Left shoulder: Normal.     Cervical back: Normal range of motion and neck supple. Spasms present.     Thoracic back: Spasms and tenderness present. No swelling, edema, deformity, signs of trauma or bony tenderness. Normal range of motion. No scoliosis.     Lumbar back: Normal.       Back:     Right lower leg: No edema.     Left lower leg: No edema.  Lymphadenopathy:     Cervical: No cervical adenopathy.  Skin:    General: Skin is warm and dry.     Capillary Refill: Capillary refill takes less than 2 seconds.     Coloration: Skin is not cyanotic, jaundiced or pale.     Findings: No rash.  Neurological:     General: No focal deficit present.     Mental Status: She is alert and oriented to person, place, and time.     Sensory: Sensation is intact.     Motor: Motor function is intact.     Coordination: Coordination is intact.     Gait: Gait is intact.  Deep Tendon Reflexes: Reflexes  are normal and symmetric.  Psychiatric:        Attention and Perception: Attention and perception normal.        Mood and Affect: Mood and affect normal.        Speech: Speech normal.        Behavior: Behavior normal. Behavior is cooperative.        Thought Content: Thought content normal.        Cognition and Memory: Cognition and memory normal.        Judgment: Judgment normal.     Results for orders placed or performed in visit on 05/29/22  Urine Culture   Specimen: Urine   UR  Result Value Ref Range   Urine Culture, Routine Final report    Organism ID, Bacteria Comment   Microscopic Examination   Urine  Result Value Ref Range   WBC, UA 6-10 (A) 0 - 5 /hpf   RBC, Urine 0-2 0 - 2 /hpf   Epithelial Cells (non renal) 0-10 0 - 10 /hpf   Renal Epithel, UA 0-10 (A) None seen /hpf   Mucus, UA Present (A) Not Estab.   Bacteria, UA Few (A) None seen/Few  Urinalysis, Complete  Result Value Ref Range   Specific Gravity, UA 1.015 1.005 - 1.030   pH, UA 7.0 5.0 - 7.5   Color, UA Yellow Yellow   Appearance Ur Clear Clear   Leukocytes,UA 2+ (A) Negative   Protein,UA Negative Negative/Trace   Glucose, UA Negative Negative   Ketones, UA Negative Negative   RBC, UA Trace (A) Negative   Bilirubin, UA Negative Negative   Urobilinogen, Ur 1.0 0.2 - 1.0 mg/dL   Nitrite, UA Negative Negative   Microscopic Examination See below:        Pertinent labs & imaging results that were available during my care of the patient were reviewed by me and considered in my medical decision making.  Assessment & Plan:  Reuben was seen today for medication refill and hypertension.  Diagnoses and all orders for this visit:  Night sweats Will check below labs for potential underlying causes. Aware she can trial black cohosh to see if beneficial.  -     Hormone Panel -     BMP8+EGFR -     CBC with Differential/Platelet  Trapezius muscle spasm Doing well on below, will continue. Aware to try massage.   -     cyclobenzaprine (FLEXERIL) 5 MG tablet; Take 1 tablet (5 mg total) by mouth 3 (three) times daily as needed for muscle spasms.  Acquired hypothyroidism Will repeat labs today and adjust regimen further if warranted.  -     Hormone Panel     Continue all other maintenance medications.  Follow up plan: Return if symptoms worsen or fail to improve.   Continue healthy lifestyle choices, including diet (rich in fruits, vegetables, and lean proteins, and low in salt and simple carbohydrates) and exercise (at least 30 minutes of moderate physical activity daily).   The above assessment and management plan was discussed with the patient. The patient verbalized understanding of and has agreed to the management plan. Patient is aware to call the clinic if they develop any new symptoms or if symptoms persist or worsen. Patient is aware when to return to the clinic for a follow-up visit. Patient educated on when it is appropriate to go to the emergency department.   Kari Baars, FNP-C [facility] [phone]

## 2022-10-01 NOTE — Patient Instructions (Signed)
Black Cohosh

## 2022-10-20 ENCOUNTER — Encounter: Payer: Self-pay | Admitting: Family Medicine

## 2022-10-20 LAB — CBC WITH DIFFERENTIAL/PLATELET
Basophils Absolute: 0.1 10*3/uL (ref 0.0–0.2)
Basos: 1 %
EOS (ABSOLUTE): 0.2 10*3/uL (ref 0.0–0.4)
Eos: 4 %
Hematocrit: 41.2 % (ref 34.0–46.6)
Hemoglobin: 13.2 g/dL (ref 11.1–15.9)
Immature Grans (Abs): 0 10*3/uL (ref 0.0–0.1)
Immature Granulocytes: 0 %
Lymphocytes Absolute: 1.2 10*3/uL (ref 0.7–3.1)
Lymphs: 22 %
MCH: 26.5 pg — ABNORMAL LOW (ref 26.6–33.0)
MCHC: 32 g/dL (ref 31.5–35.7)
MCV: 83 fL (ref 79–97)
Monocytes Absolute: 0.4 10*3/uL (ref 0.1–0.9)
Monocytes: 7 %
Neutrophils Absolute: 3.6 10*3/uL (ref 1.4–7.0)
Neutrophils: 66 %
Platelets: 256 10*3/uL (ref 150–450)
RBC: 4.98 x10E6/uL (ref 3.77–5.28)
RDW: 14.6 % (ref 11.7–15.4)
WBC: 5.5 10*3/uL (ref 3.4–10.8)

## 2022-10-20 LAB — HORMONE PANEL (T4,TSH,FSH,TESTT,SHBG,DHEA,ETC)
DHEA-Sulfate, LCMS: 44 ug/dL
Estradiol, Serum, MS: 10 pg/mL
Estrone Sulfate: <10 ng/dL
Follicle Stimulating Hormone: 78 m[IU]/mL
Free T-3: 2.6 pg/mL
Free Testosterone, Serum: 1.3 pg/mL
Progesterone, Serum: <10 ng/dL
Sex Hormone Binding Globulin: 50.6 nmol/L
T4: 8.2 ug/dL
TSH: 3.1 uU/mL
Testosterone, Serum (Total): 12 ng/dL
Testosterone-% Free: 1.1 %
Triiodothyronine (T-3), Serum: 109 ng/dL

## 2022-10-20 LAB — BMP8+EGFR
BUN/Creatinine Ratio: 20 (ref 12–28)
BUN: 13 mg/dL (ref 8–27)
CO2: 24 mmol/L (ref 20–29)
Calcium: 8.8 mg/dL (ref 8.7–10.3)
Chloride: 105 mmol/L (ref 96–106)
Creatinine, Ser: 0.66 mg/dL (ref 0.57–1.00)
Glucose: 74 mg/dL (ref 70–99)
Potassium: 4.2 mmol/L (ref 3.5–5.2)
Sodium: 140 mmol/L (ref 134–144)
eGFR: 95 mL/min/{1.73_m2} (ref >59)

## 2022-11-23 ENCOUNTER — Encounter: Payer: Self-pay | Admitting: Internal Medicine

## 2022-11-23 ENCOUNTER — Ambulatory Visit: Payer: Medicare HMO | Admitting: Internal Medicine

## 2022-11-23 VITALS — BP 157/79 | HR 83 | Ht 69.0 in | Wt 214.0 lb

## 2022-11-23 DIAGNOSIS — M8580 Other specified disorders of bone density and structure, unspecified site: Secondary | ICD-10-CM

## 2022-11-23 DIAGNOSIS — I10 Essential (primary) hypertension: Secondary | ICD-10-CM | POA: Diagnosis not present

## 2022-11-23 DIAGNOSIS — E039 Hypothyroidism, unspecified: Secondary | ICD-10-CM

## 2022-11-23 DIAGNOSIS — Z23 Encounter for immunization: Secondary | ICD-10-CM

## 2022-11-23 DIAGNOSIS — M797 Fibromyalgia: Secondary | ICD-10-CM

## 2022-11-23 MED ORDER — OLMESARTAN MEDOXOMIL 20 MG PO TABS: 20.0000 mg | ORAL_TABLET | Freq: Every day | ORAL | 2 refills | Status: DC

## 2022-11-23 NOTE — Assessment & Plan Note (Signed)
Previously documented history of HTN.  She has been prescribed lisinopril in the past but states that she has not been taking it due to adverse side effects (nausea).  BP remains elevated today and is her acute concern.  She is in agreement with trying a different antihypertensive medication. -Start olmesartan 20 mg daily -Follow-up in 4-6 weeks for BP check

## 2022-11-23 NOTE — Progress Notes (Signed)
New Patient Office Visit  Subjective    Patient ID: Janet Gardner, female    DOB: 09/27/1955  Age: 67 y.o. MRN: [id number]  CC:  Chief Complaint  Patient presents with   Establish Care    HPI Janet Gardner presents to establish care.  Janet Gardner is a 67 year old woman with a past medical history significant for HTN, fibromyalgia, and hypothyroidism, osteopenia, and HLD.  Previously followed by Gilford Silvius, NP at [facility].  Switching to our practice for convenience after moving to [place].  Janet Gardner reports feeling well today.  Janet Gardner is asymptomatic currently.  Her acute concern is poorly controlled hypertension.  BP readings are elevated today.  Janet Gardner was previously prescribed lisinopril but has not been taking it due to adverse side effects (nausea).  Janet Gardner does not have any additional concerns to discuss.  Janet Gardner is currently unemployed.  Janet Gardner denies tobacco, alcohol, and illicit drug use.  Her family medical history is significant for breast cancer (mother).  Acute concerns, chronic medical conditions, and outstanding preventative care items discussed today are individually addressed in A/P below.   Outpatient Encounter Medications as of 11/23/2022  Medication Sig   cyclobenzaprine (FLEXERIL) 5 MG tablet Take 1 tablet (5 mg total) by mouth 3 (three) times daily as needed for muscle spasms.   folic acid-vitamin b complex-vitamin c-selenium-zinc (DIALYVITE) 3 MG TABS tablet Take 1 tablet by mouth daily.   levothyroxine (SYNTHROID) 112 MCG tablet TAKE 1 TABLET EVERY DAY BEFORE BREAKFAST   olmesartan (BENICAR) 20 MG tablet Take 1 tablet (20 mg total) by mouth daily.   vitamin B-12 (CYANOCOBALAMIN) 500 MCG tablet Take 500 mcg by mouth daily.   [DISCONTINUED] DULoxetine (CYMBALTA) 30 MG capsule Take 1 capsule (30 mg total) by mouth daily. (Patient not taking: Reported on 11/23/2022)   [DISCONTINUED] lisinopril (ZESTRIL) 20 MG tablet TAKE 1 TABLET EVERY DAY (Patient not taking: Reported on 11/23/2022)    No facility-administered encounter medications on file as of 11/23/2022.    Past Medical History:  Diagnosis Date   B12 deficiency    monthly injection   Fibromyalgia    GERD (gastroesophageal reflux disease)    Hyperlipidemia    Hypothyroidism    Osteopenia 06/11/2020    Past Surgical History:  Procedure Laterality Date   ABDOMINAL HYSTERECTOMY     CHOLECYSTECTOMY     colonoscopy  2007   Dr. Laurell Josephs, incomplete due to poor prep. scope passed to transverse colon. ACBE normal.   ESOPHAGOGASTRODUODENOSCOPY  07/22/2009   YQM:VHQION apperaring esophagus s/p 56 dilator/small HH/large ulcerated gastric polyp in the prepyloric antral area. Inflammatory fibroid polyp.   ESOPHAGOGASTRODUODENOSCOPY (EGD) WITH ESOPHAGEAL DILATION N/A 11/24/2012   Procedure: ESOPHAGOGASTRODUODENOSCOPY (EGD) WITH ESOPHAGEAL DILATION;  Surgeon: Corbin Ade, MD;  Location: [facility];  Service: Endoscopy;  Laterality: N/A;  9:30AM   FOOT SURGERY     Right   KIDNEY SURGERY      Family History  Problem Relation Age of Onset   Breast cancer Mother    Heart disease Father    Colon cancer Neg Hx     Social History   Socioeconomic History   Marital status: Married    Spouse name: Not on file   Number of children: 1   Years of education: Not on file   Highest education level: Not on file  Occupational History   Occupation: retired    Comment: retired  Tobacco Use   Smoking status: Never   Smokeless tobacco: Never  Vaping Use   Vaping status: Never Used  Substance and Sexual Activity   Alcohol use: No   Drug use: No   Sexual activity: Not on file  Other Topics Concern   Not on file  Social History Narrative   Lives with her husband and her 70 year old granddaughter. Her daughter lives in [place]   Social Determinants of Health   Financial Resource Strain: Low Risk  (06/09/2022)   Overall Financial Resource Strain (CARDIA)    Difficulty of Paying Living Expenses: Not hard at all  Food  Insecurity: No Food Insecurity (06/09/2022)   Hunger Vital Sign    Worried About Running Out of Food in the Last Year: Never true    Ran Out of Food in the Last Year: Never true  Transportation Needs: No Transportation Needs (06/09/2022)   PRAPARE - Administrator, Civil Service (Medical): No    Lack of Transportation (Non-Medical): No  Physical Activity: Insufficiently Active (06/09/2022)   Exercise Vital Sign    Days of Exercise per Week: 3 days    Minutes of Exercise per Session: 30 min  Stress: No Stress Concern Present (06/09/2022)   [employer] of Occupational Health - Occupational Stress Questionnaire    Feeling of Stress : Not at all  Social Connections: Socially Integrated (06/09/2022)   Social Connection and Isolation Panel [NHANES]    Frequency of Communication with Friends and Family: More than three times a week    Frequency of Social Gatherings with Friends and Family: More than three times a week    Attends Religious Services: More than 4 times per year    Active Member of [employer] or Organizations: Yes    Attends Engineer, structural: More than 4 times per year    Marital Status: Married  Catering manager Violence: Not At Risk (06/09/2022)   Humiliation, Afraid, Rape, and Kick questionnaire    Fear of Current or Ex-Partner: No    Emotionally Abused: No    Physically Abused: No    Sexually Abused: No   Review of Systems  Constitutional:  Negative for chills and fever.  HENT:  Negative for sore throat.   Respiratory:  Negative for cough and shortness of breath.   Cardiovascular:  Negative for chest pain, palpitations and leg swelling.  Gastrointestinal:  Negative for abdominal pain, blood in stool, constipation, diarrhea, nausea and vomiting.  Genitourinary:  Negative for dysuria and hematuria.  Musculoskeletal:  Negative for myalgias.  Skin:  Negative for itching and rash.  Neurological:  Negative for dizziness and headaches.   Psychiatric/Behavioral:  Negative for depression and suicidal ideas.     Objective    BP (!) 157/79 (BP Location: Left Arm, Patient Position: Sitting, Cuff Size: Large)   Pulse 83   Ht 5\' 9"  (1.753 m)   Wt 214 lb (97.1 kg)   SpO2 97%   BMI 31.60 kg/m   Physical Exam Vitals reviewed.  Constitutional:      General: Janet Gardner is not in acute distress.    Appearance: Normal appearance. Janet Gardner is obese. Janet Gardner is not toxic-appearing.  HENT:     Head: Normocephalic and atraumatic.     Right Ear: External ear normal.     Left Ear: External ear normal.     Nose: Nose normal. No congestion or rhinorrhea.     Mouth/Throat:     Mouth: Mucous membranes are moist.     Pharynx: Oropharynx is clear. No oropharyngeal exudate or posterior oropharyngeal  erythema.  Eyes:     General: No scleral icterus.    Extraocular Movements: Extraocular movements intact.     Conjunctiva/sclera: Conjunctivae normal.     Pupils: Pupils are equal, round, and reactive to light.  Cardiovascular:     Rate and Rhythm: Normal rate and regular rhythm.     Pulses: Normal pulses.     Heart sounds: Normal heart sounds. No murmur heard.    No friction rub. No gallop.  Pulmonary:     Effort: Pulmonary effort is normal.     Breath sounds: Normal breath sounds. No wheezing, rhonchi or rales.  Abdominal:     General: Abdomen is flat. Bowel sounds are normal. There is no distension.     Palpations: Abdomen is soft.     Tenderness: There is no abdominal tenderness.  Musculoskeletal:        General: No swelling. Normal range of motion.     Cervical back: Normal range of motion.     Right lower leg: No edema.     Left lower leg: No edema.  Lymphadenopathy:     Cervical: No cervical adenopathy.  Skin:    General: Skin is warm and dry.     Capillary Refill: Capillary refill takes less than 2 seconds.     Coloration: Skin is not jaundiced.  Neurological:     General: No focal deficit present.     Mental Status: Janet Gardner is alert  and oriented to person, place, and time.  Psychiatric:        Mood and Affect: Mood normal.        Behavior: Behavior normal.   Last CBC Lab Results  Component Value Date   WBC 5.5 10/01/2022   HGB 13.2 10/01/2022   HCT 41.2 10/01/2022   MCV 83 10/01/2022   MCH 26.5 (L) 10/01/2022   RDW 14.6 10/01/2022   PLT 256 10/01/2022   Last metabolic panel Lab Results  Component Value Date   GLUCOSE 74 10/01/2022   NA 140 10/01/2022   K 4.2 10/01/2022   CL 105 10/01/2022   CO2 24 10/01/2022   BUN 13 10/01/2022   CREATININE 0.66 10/01/2022   EGFR 95 10/01/2022   CALCIUM 8.8 10/01/2022   PROT 6.7 05/19/2022   ALBUMIN 3.9 05/19/2022   LABGLOB 2.8 05/19/2022   AGRATIO 1.4 05/19/2022   BILITOT 0.2 05/19/2022   ALKPHOS 89 05/19/2022   AST 20 05/19/2022   ALT 13 05/19/2022   Last lipids Lab Results  Component Value Date   CHOL 194 05/19/2022   HDL 41 05/19/2022   LDLCALC 127 (H) 05/19/2022   TRIG 146 05/19/2022   CHOLHDL 4.7 (H) 05/19/2022   Last thyroid functions Lab Results  Component Value Date   TSH 1.880 05/19/2022   T4TOTAL 12.1 (H) 05/19/2022   Last vitamin D Lab Results  Component Value Date   VD25OH 31.4 05/19/2022   Last vitamin B12 and Folate Lab Results  Component Value Date   VITAMINB12 323 05/19/2022    Assessment & Plan:   Problem List Items Addressed This Visit       Primary hypertension - Primary    Previously documented history of HTN.  Janet Gardner has been prescribed lisinopril in the past but states that Janet Gardner has not been taking it due to adverse side effects (nausea).  BP remains elevated today and is her acute concern.  Janet Gardner is in agreement with trying a different antihypertensive medication. -Start olmesartan 20 mg daily -Follow-up in 4-6  weeks for BP check      Acquired hypothyroidism    Currently prescribed levothyroxine 112 mcg daily.  TFTs WNL when checked in August.  Janet Gardner remains asymptomatic.  No medication changes have been made today.       Osteopenia determined by x-ray    Noted on DEXA most recently updated in May 2022.  Janet Gardner is currently on vitamin D and calcium supplementation.  No changes have been made today.      Fibromyalgia muscle pain    Janet Gardner endorses a history of fibromyalgia.  Previously treated with Cymbalta, however this was discontinued as it caused insomnia.  Janet Gardner is currently using Flexeril as needed for treatment of flares but remains interested in starting a daily medication to manage fibromyalgia. -No medication changes have been made today.  Janet Gardner will return to care in 4 to 6 weeks for reassessment.  Consider starting amitriptyline at that time.      Need for influenza vaccination    Influenza vaccine administered today      Return in about 6 weeks (around 01/04/2023) for HTN, fibromyalgia.   Billie Lade, MD

## 2022-11-23 NOTE — Patient Instructions (Signed)
It was a pleasure to see you today.  Thank you for giving [place] the opportunity to be involved in your care.  Below is a brief recap of your visit and next steps.  We will plan to see you again in 4-6 weeks.  Summary You have established care today Start olmesartan 20 mg daily for hypertension Follow up in 6 weeks for BP check and to discuss fibromyalgia Flu shot today

## 2022-11-23 NOTE — Assessment & Plan Note (Signed)
She endorses a history of fibromyalgia.  Previously treated with Cymbalta, however this was discontinued as it caused insomnia.  She is currently using Flexeril as needed for treatment of flares but remains interested in starting a daily medication to manage fibromyalgia. -No medication changes have been made today.  She will return to care in 4 to 6 weeks for reassessment.  Consider starting amitriptyline at that time.

## 2022-11-23 NOTE — Assessment & Plan Note (Signed)
Currently prescribed levothyroxine 112 mcg daily.  TFTs WNL when checked in August.  She remains asymptomatic.  No medication changes have been made today.

## 2022-11-23 NOTE — Assessment & Plan Note (Signed)
Influenza vaccine administered today.

## 2022-11-23 NOTE — Assessment & Plan Note (Signed)
Noted on DEXA most recently updated in May 2022.  She is currently on vitamin D and calcium supplementation.  No changes have been made today.

## 2022-12-29 ENCOUNTER — Ambulatory Visit (INDEPENDENT_AMBULATORY_CARE_PROVIDER_SITE_OTHER): Payer: Medicare HMO | Admitting: Internal Medicine

## 2022-12-29 ENCOUNTER — Encounter: Payer: Self-pay | Admitting: Internal Medicine

## 2022-12-29 VITALS — BP 140/82 | HR 81 | Resp 16 | Ht 69.0 in | Wt 214.6 lb

## 2022-12-29 DIAGNOSIS — I10 Essential (primary) hypertension: Secondary | ICD-10-CM | POA: Diagnosis not present

## 2022-12-29 MED ORDER — AMLODIPINE-OLMESARTAN 5-20 MG PO TABS
1.0000 | ORAL_TABLET | Freq: Every day | ORAL | 2 refills | Status: DC
Start: 2022-12-29 — End: 2023-01-25

## 2022-12-29 NOTE — Assessment & Plan Note (Signed)
Return to care today for HTN follow-up.  Olmesartan 20 mg daily was started at her last appointment.  BP today is 140/82, improved from previous readings but remains above goal. -Discontinue olmesartan and start amlodipine-olmesartan 5-20 mg daily. -Repeat BMP ordered today -Nurse visit in 2 weeks for BP check -Follow-up for routine care in 3 months

## 2022-12-29 NOTE — Patient Instructions (Signed)
It was a pleasure to see you today.  Thank you for giving [place] the opportunity to be involved in your care.  Below is a brief recap of your visit and next steps.  We will plan to see you again in 3 months.  Summary Start amlodipine-olmesartan 5-20 mg daily Discontinue olmesartan Nurse visit in 2 weeks for BP check Follow up with me in 3 months

## 2022-12-29 NOTE — Progress Notes (Signed)
Established Patient Office Visit  Subjective   Patient ID: Janet Gardner, female    DOB: Nov 11, 1955  Age: 67 y.o. MRN: [id number]  Chief Complaint  Patient presents with   Hypertension    6 week follow up    Ms. Naik returns to care today for follow-up of HTN and fibromyalgia.  She was last evaluated by me on 10/14 as a new patient presenting to establish care.  Her blood pressure was significantly elevated at that time and olmesartan 20 mg daily was started.  6-week follow-up was arranged for reassessment.  There have been no acute interval events.  Ms. Redner reports feeling well today.  She is asymptomatic and has no acute concerns to discuss.  Past Medical History:  Diagnosis Date   B12 deficiency    monthly injection   Fibromyalgia    GERD (gastroesophageal reflux disease)    Hyperlipidemia    Hypothyroidism    Osteopenia 06/11/2020   Past Surgical History:  Procedure Laterality Date   ABDOMINAL HYSTERECTOMY     CHOLECYSTECTOMY     colonoscopy  2007   Dr. Laurell Josephs, incomplete due to poor prep. scope passed to transverse colon. ACBE normal.   ESOPHAGOGASTRODUODENOSCOPY  07/22/2009   [id number] apperaring esophagus s/p 56 dilator/small HH/large ulcerated gastric polyp in the prepyloric antral area. Inflammatory fibroid polyp.   ESOPHAGOGASTRODUODENOSCOPY (EGD) WITH ESOPHAGEAL DILATION N/A 11/24/2012   Procedure: ESOPHAGOGASTRODUODENOSCOPY (EGD) WITH ESOPHAGEAL DILATION;  Surgeon: Corbin Ade, MD;  Location: [facility];  Service: Endoscopy;  Laterality: N/A;  9:30AM   FOOT SURGERY     Right   KIDNEY SURGERY     Social History   Tobacco Use   Smoking status: Never   Smokeless tobacco: Never  Vaping Use   Vaping status: Never Used  Substance Use Topics   Alcohol use: No   Drug use: No   Family History  Problem Relation Age of Onset   Breast cancer Mother    Heart disease Father    Colon cancer Neg Hx    Allergies  Allergen Reactions   Azithromycin Other (See  Comments)    Caused hematuria.   Levaquin [Levofloxacin] Nausea And Vomiting   Sulfa Antibiotics Hives    Vomiting   Review of Systems  Constitutional:  Negative for chills and fever.  HENT:  Negative for sore throat.   Respiratory:  Negative for cough and shortness of breath.   Cardiovascular:  Negative for chest pain, palpitations and leg swelling.  Gastrointestinal:  Negative for abdominal pain, blood in stool, constipation, diarrhea, nausea and vomiting.  Genitourinary:  Negative for dysuria and hematuria.  Musculoskeletal:  Negative for myalgias.  Skin:  Negative for itching and rash.  Neurological:  Negative for dizziness and headaches.  Psychiatric/Behavioral:  Negative for depression and suicidal ideas.      Objective:     BP (!) 140/82   Pulse 81   Resp 16   Ht 5\' 9"  (1.753 m)   Wt 214 lb 9.6 oz (97.3 kg)   SpO2 98%   BMI 31.69 kg/m  BP Readings from Last 3 Encounters:  12/29/22 (!) 140/82  11/23/22 (!) 157/79  10/01/22 (!) 146/71   Physical Exam Vitals reviewed.  Constitutional:      General: She is not in acute distress.    Appearance: Normal appearance. She is obese. She is not toxic-appearing.  HENT:     Head: Normocephalic and atraumatic.     Right Ear: External ear normal.  Left Ear: External ear normal.     Nose: Nose normal. No congestion or rhinorrhea.     Mouth/Throat:     Mouth: Mucous membranes are moist.     Pharynx: Oropharynx is clear. No oropharyngeal exudate or posterior oropharyngeal erythema.  Eyes:     General: No scleral icterus.    Extraocular Movements: Extraocular movements intact.     Conjunctiva/sclera: Conjunctivae normal.     Pupils: Pupils are equal, round, and reactive to light.  Cardiovascular:     Rate and Rhythm: Normal rate and regular rhythm.     Pulses: Normal pulses.     Heart sounds: Normal heart sounds. No murmur heard.    No friction rub. No gallop.  Pulmonary:     Effort: Pulmonary effort is normal.      Breath sounds: Normal breath sounds. No wheezing, rhonchi or rales.  Abdominal:     General: Abdomen is flat. Bowel sounds are normal. There is no distension.     Palpations: Abdomen is soft.     Tenderness: There is no abdominal tenderness.  Musculoskeletal:        General: No swelling. Normal range of motion.     Cervical back: Normal range of motion.     Right lower leg: No edema.     Left lower leg: No edema.  Lymphadenopathy:     Cervical: No cervical adenopathy.  Skin:    General: Skin is warm and dry.     Capillary Refill: Capillary refill takes less than 2 seconds.     Coloration: Skin is not jaundiced.  Neurological:     General: No focal deficit present.     Mental Status: She is alert and oriented to person, place, and time.  Psychiatric:        Mood and Affect: Mood normal.        Behavior: Behavior normal.   Last CBC Lab Results  Component Value Date   WBC 5.5 10/01/2022   HGB 13.2 10/01/2022   HCT 41.2 10/01/2022   MCV 83 10/01/2022   MCH 26.5 (L) 10/01/2022   RDW 14.6 10/01/2022   PLT 256 10/01/2022   Last metabolic panel Lab Results  Component Value Date   GLUCOSE 74 10/01/2022   NA 140 10/01/2022   K 4.2 10/01/2022   CL 105 10/01/2022   CO2 24 10/01/2022   BUN 13 10/01/2022   CREATININE 0.66 10/01/2022   EGFR 95 10/01/2022   CALCIUM 8.8 10/01/2022   PROT 6.7 05/19/2022   ALBUMIN 3.9 05/19/2022   LABGLOB 2.8 05/19/2022   AGRATIO 1.4 05/19/2022   BILITOT 0.2 05/19/2022   ALKPHOS 89 05/19/2022   AST 20 05/19/2022   ALT 13 05/19/2022   Last lipids Lab Results  Component Value Date   CHOL 194 05/19/2022   HDL 41 05/19/2022   LDLCALC 127 (H) 05/19/2022   TRIG 146 05/19/2022   CHOLHDL 4.7 (H) 05/19/2022   Last thyroid functions Lab Results  Component Value Date   TSH 1.880 05/19/2022   T4TOTAL 12.1 (H) 05/19/2022   Last vitamin D Lab Results  Component Value Date   VD25OH 31.4 05/19/2022   Last vitamin B12 and Folate Lab Results   Component Value Date   VITAMINB12 323 05/19/2022   The 10-year ASCVD risk score (Arnett DK, et al., 2019) is: 14.2%    Assessment & Plan:   Problem List Items Addressed This Visit       Primary hypertension - Primary  Return to care today for HTN follow-up.  Olmesartan 20 mg daily was started at her last appointment.  BP today is 140/82, improved from previous readings but remains above goal. -Discontinue olmesartan and start amlodipine-olmesartan 5-20 mg daily. -Repeat BMP ordered today -Nurse visit in 2 weeks for BP check -Follow-up for routine care in 3 months      Return in about 3 months (around 03/31/2023).   Billie Lade, MD

## 2022-12-30 LAB — BASIC METABOLIC PANEL
BUN/Creatinine Ratio: 20 (ref 12–28)
BUN: 13 mg/dL (ref 8–27)
CO2: 21 mmol/L (ref 20–29)
Calcium: 8.6 mg/dL — ABNORMAL LOW (ref 8.7–10.3)
Chloride: 105 mmol/L (ref 96–106)
Creatinine, Ser: 0.66 mg/dL (ref 0.57–1.00)
Glucose: 136 mg/dL — ABNORMAL HIGH (ref 70–99)
Potassium: 4 mmol/L (ref 3.5–5.2)
Sodium: 140 mmol/L (ref 134–144)
eGFR: 95 mL/min/{1.73_m2} (ref >59)

## 2023-01-12 ENCOUNTER — Ambulatory Visit: Payer: Medicare HMO

## 2023-01-14 ENCOUNTER — Encounter: Payer: Self-pay | Admitting: Internal Medicine

## 2023-01-23 ENCOUNTER — Encounter: Payer: Self-pay | Admitting: Internal Medicine

## 2023-01-23 DIAGNOSIS — I10 Essential (primary) hypertension: Secondary | ICD-10-CM

## 2023-01-25 MED ORDER — AMLODIPINE BESYLATE 5 MG PO TABS: 5.0000 mg | ORAL_TABLET | Freq: Every day | ORAL | 1 refills | Status: DC

## 2023-01-25 MED ORDER — OLMESARTAN MEDOXOMIL 20 MG PO TABS: 20.0000 mg | ORAL_TABLET | Freq: Every day | ORAL | 2 refills | Status: DC

## 2023-01-29 ENCOUNTER — Encounter: Payer: Self-pay | Admitting: Internal Medicine

## 2023-02-08 ENCOUNTER — Ambulatory Visit (INDEPENDENT_AMBULATORY_CARE_PROVIDER_SITE_OTHER): Payer: Medicare HMO | Admitting: Family Medicine

## 2023-02-08 ENCOUNTER — Encounter: Payer: Self-pay | Admitting: Family Medicine

## 2023-02-08 ENCOUNTER — Encounter: Payer: Self-pay | Admitting: Internal Medicine

## 2023-02-08 VITALS — BP 129/80 | HR 94 | Ht 69.0 in | Wt 213.1 lb

## 2023-02-08 DIAGNOSIS — H6121 Impacted cerumen, right ear: Secondary | ICD-10-CM | POA: Insufficient documentation

## 2023-02-08 DIAGNOSIS — B349 Viral infection, unspecified: Secondary | ICD-10-CM | POA: Diagnosis not present

## 2023-02-08 MED ORDER — PREDNISONE 20 MG PO TABS: 40.0000 mg | ORAL_TABLET | Freq: Every day | ORAL | 0 refills | Status: AC

## 2023-02-08 MED ORDER — PROMETHAZINE-DM 6.25-15 MG/5ML PO SYRP: 5.0000 mL | ORAL_SOLUTION | Freq: Four times a day (QID) | ORAL | 0 refills | Status: DC | PRN

## 2023-02-08 NOTE — Assessment & Plan Note (Signed)
Ear irrigation performed.

## 2023-02-08 NOTE — Patient Instructions (Addendum)
I appreciate the opportunity to provide care to you today!     Viral Illness:  Start taking Promethazine DM 5 mL by mouth every 4 hours as needed for cough and cold symptoms Prednisone 40 mg daily for 5 days for chest congestion Increase fluid intake and allow for plenty of rest. Take Tylenol as needed for pain, fever, or general discomfort. Perform warm saltwater gargles 3-4 times daily to help with throat pain or discomfort. (Mix 1/2 teaspoon of salt in a glass of warm water and gargle several times daily to reduce throat inflammation and soothe irritation.) Ginger tea: Reduces throat irritation and can help with nausea. Look for sugar-free or honey-based throat lozenges to help with a sore throat. Use a humidifier at bedtime to help with cough and nasal congestion. For nasal congestion: The use of heated humidified air is a safe and effective therapy. Saline nasal sprays may also help alleviate nasal symptoms of the common cold.  Please follow up if your symptoms worsen, especially with fever, thick discolored nasal drainage (yellow or green in color), nasal congestion, or pain and tenderness around the cheeks, forehead, or eyes that worsens when bending forward.    Please continue to a heart-healthy diet and increase your physical activities. Try to exercise for at least five days a week.    It was a pleasure to see you and I look forward to continuing to work together on your health and well-being. Please do not hesitate to call the office if you need care or have questions about your care.  In case of emergency, please visit the Emergency Department for urgent care, or contact our clinic at [phone] to schedule an appointment. We're here to help you!   Have a wonderful day and week. With Gratitude, Gilmore Laroche MSN, FNP-BC

## 2023-02-08 NOTE — Assessment & Plan Note (Addendum)
I will initiate therapy with Promethazine DM for cough and congestion. I encouraged the patient to take prednisone 40 mg daily for 5 days. Nonpharmacological management includes rest, hydration, and taking Tylenol as needed for pain, fever, or general discomfort. A humidifier should be used at bedtime to help with cough and nasal congestion. For nasal congestion, the use of heated humidified air is a safe and effective therapy. Saline nasal sprays may also help alleviate nasal symptoms of the common cold. The patient was encouraged to follow up if her symptoms worsen, especially with fever, thick discolored nasal drainage (yellow or green in color), or pain and tenderness around the cheeks, forehead, or eyes that worsens when bending forward.

## 2023-02-08 NOTE — Progress Notes (Signed)
Acute Office Visit  Subjective:    Patient ID: Janet Gardner, female    DOB: September 16, 1955, 67 y.o.   MRN: [id number]  Chief Complaint  Patient presents with   Sinus Problem    Pt reports sinus infection , drainage but no cough or body aches. Ongoing since 02/01/2023, states she has chest congestion.     HPI The patient is in today with complaints of postnasal drip, nasal congestion, chest congestion, and a mild cough. There is no fever, headache, body aches, nausea, vomiting, diarrhea, or facial pain and pressure reported. The patient has been taking Mucinex over-the-counter with minimal relief. She experiences these symptoms yearly and is requesting antibiotics today. Additionally, she notes having an earache.  Past Medical History:  Diagnosis Date   B12 deficiency    monthly injection   Fibromyalgia    GERD (gastroesophageal reflux disease)    Hyperlipidemia    Hypothyroidism    Osteopenia 06/11/2020    Past Surgical History:  Procedure Laterality Date   ABDOMINAL HYSTERECTOMY     CHOLECYSTECTOMY     colonoscopy  2007   Dr. Laurell Josephs, incomplete due to poor prep. scope passed to transverse colon. ACBE normal.   ESOPHAGOGASTRODUODENOSCOPY  07/22/2009   [id number] apperaring esophagus s/p 56 dilator/small HH/large ulcerated gastric polyp in the prepyloric antral area. Inflammatory fibroid polyp.   ESOPHAGOGASTRODUODENOSCOPY (EGD) WITH ESOPHAGEAL DILATION N/A 11/24/2012   Procedure: ESOPHAGOGASTRODUODENOSCOPY (EGD) WITH ESOPHAGEAL DILATION;  Surgeon: Corbin Ade, MD;  Location: [facility];  Service: Endoscopy;  Laterality: N/A;  9:30AM   FOOT SURGERY     Right   KIDNEY SURGERY      Family History  Problem Relation Age of Onset   Breast cancer Mother    Heart disease Father    Colon cancer Neg Hx     Social History   Socioeconomic History   Marital status: Married    Spouse name: Not on file   Number of children: 1   Years of education: Not on file   Highest  education level: Not on file  Occupational History   Occupation: retired    Comment: retired  Tobacco Use   Smoking status: Never   Smokeless tobacco: Never  Vaping Use   Vaping status: Never Used  Substance and Sexual Activity   Alcohol use: No   Drug use: No   Sexual activity: Not on file  Other Topics Concern   Not on file  Social History Narrative   Lives with her husband and her 36 year old granddaughter. Her daughter lives in [place]   Social Drivers of Health   Financial Resource Strain: Low Risk  (06/09/2022)   Overall Financial Resource Strain (CARDIA)    Difficulty of Paying Living Expenses: Not hard at all  Food Insecurity: No Food Insecurity (06/09/2022)   Hunger Vital Sign    Worried About Running Out of Food in the Last Year: Never true    Ran Out of Food in the Last Year: Never true  Transportation Needs: No Transportation Needs (06/09/2022)   PRAPARE - Administrator, Civil Service (Medical): No    Lack of Transportation (Non-Medical): No  Physical Activity: Insufficiently Active (06/09/2022)   Exercise Vital Sign    Days of Exercise per Week: 3 days    Minutes of Exercise per Session: 30 min  Stress: No Stress Concern Present (06/09/2022)   [employer] of Occupational Health - Occupational Stress Questionnaire    Feeling of Stress :  Not at all  Social Connections: Socially Integrated (06/09/2022)   Social Connection and Isolation Panel [NHANES]    Frequency of Communication with Friends and Family: More than three times a week    Frequency of Social Gatherings with Friends and Family: More than three times a week    Attends Religious Services: More than 4 times per year    Active Member of [employer] or Organizations: Yes    Attends Engineer, structural: More than 4 times per year    Marital Status: Married  Catering manager Violence: Not At Risk (06/09/2022)   Humiliation, Afraid, Rape, and Kick questionnaire    Fear of Current or  Ex-Partner: No    Emotionally Abused: No    Physically Abused: No    Sexually Abused: No    Outpatient Medications Prior to Visit  Medication Sig Dispense Refill   amLODipine (NORVASC) 5 MG tablet Take 1 tablet (5 mg total) by mouth daily. 90 tablet 1   cyclobenzaprine (FLEXERIL) 5 MG tablet Take 1 tablet (5 mg total) by mouth 3 (three) times daily as needed for muscle spasms. 30 tablet 1   folic acid-vitamin b complex-vitamin c-selenium-zinc (DIALYVITE) 3 MG TABS tablet Take 1 tablet by mouth daily.     levothyroxine (SYNTHROID) 112 MCG tablet TAKE 1 TABLET EVERY DAY BEFORE BREAKFAST 90 tablet 3   olmesartan (BENICAR) 20 MG tablet Take 1 tablet (20 mg total) by mouth daily. 30 tablet 2   vitamin B-12 (CYANOCOBALAMIN) 500 MCG tablet Take 500 mcg by mouth daily.     No facility-administered medications prior to visit.    Allergies  Allergen Reactions   Azithromycin Other (See Comments)    Caused hematuria.   Levaquin [Levofloxacin] Nausea And Vomiting   Sulfa Antibiotics Hives    Vomiting    Review of Systems  Constitutional:  Negative for chills and fever.  HENT:  Positive for congestion and postnasal drip. Negative for sinus pressure, sinus pain and sore throat.   Eyes:  Negative for visual disturbance.  Respiratory:  Negative for chest tightness and shortness of breath.   Neurological:  Negative for dizziness and headaches.       Objective:    Physical Exam HENT:     Head: Normocephalic.     Right Ear: There is impacted cerumen.     Left Ear: There is no impacted cerumen.     Nose:     Right Sinus: No maxillary sinus tenderness or frontal sinus tenderness.     Left Sinus: No maxillary sinus tenderness or frontal sinus tenderness.     Mouth/Throat:     Mouth: Mucous membranes are moist.  Cardiovascular:     Rate and Rhythm: Normal rate.     Heart sounds: Normal heart sounds.  Pulmonary:     Effort: Pulmonary effort is normal.     Breath sounds: Normal breath  sounds.  Neurological:     Mental Status: She is alert.     BP 129/80   Pulse 94   Ht 5\' 9"  (1.753 m)   Wt 213 lb 1.3 oz (96.7 kg)   SpO2 93%   BMI 31.47 kg/m  Wt Readings from Last 3 Encounters:  02/08/23 213 lb 1.3 oz (96.7 kg)  12/29/22 214 lb 9.6 oz (97.3 kg)  11/23/22 214 lb (97.1 kg)       Assessment & Plan:  Viral illness Assessment & Plan: I will initiate therapy with Promethazine DM for cough and congestion. I encouraged  the patient to take prednisone 40 mg daily for 5 days. Nonpharmacological management includes rest, hydration, and taking Tylenol as needed for pain, fever, or general discomfort. A humidifier should be used at bedtime to help with cough and nasal congestion. For nasal congestion, the use of heated humidified air is a safe and effective therapy. Saline nasal sprays may also help alleviate nasal symptoms of the common cold. The patient was encouraged to follow up if her symptoms worsen, especially with fever, thick discolored nasal drainage (yellow or green in color), or pain and tenderness around the cheeks, forehead, or eyes that worsens when bending forward.   Orders: -     predniSONE; Take 2 tablets (40 mg total) by mouth daily for 5 days.  Dispense: 10 tablet; Refill: 0 -     Promethazine-DM; Take 5 mLs by mouth 4 (four) times daily as needed.  Dispense: 118 mL; Refill: 0 -     COVID-19, Flu A+B and RSV  Impacted cerumen of right ear Assessment & Plan: Ear irrigation performed   Note: This chart has been completed using Engineer, civil (consulting) software, and while attempts have been made to ensure accuracy, certain words and phrases may not be transcribed as intended.    Gilmore Laroche, FNP

## 2023-02-11 ENCOUNTER — Encounter: Payer: Self-pay | Admitting: Family Medicine

## 2023-02-22 ENCOUNTER — Encounter: Payer: Self-pay | Admitting: Internal Medicine

## 2023-03-03 ENCOUNTER — Encounter: Payer: Self-pay | Admitting: Internal Medicine

## 2023-03-15 ENCOUNTER — Encounter: Payer: Self-pay | Admitting: Internal Medicine

## 2023-03-15 ENCOUNTER — Other Ambulatory Visit: Payer: Self-pay | Admitting: Internal Medicine

## 2023-03-15 DIAGNOSIS — I10 Essential (primary) hypertension: Secondary | ICD-10-CM

## 2023-03-15 DIAGNOSIS — M797 Fibromyalgia: Secondary | ICD-10-CM

## 2023-03-15 MED ORDER — DULOXETINE HCL 30 MG PO CPEP: 30.0000 mg | ORAL_CAPSULE | Freq: Every day | ORAL | 3 refills | Status: DC

## 2023-03-24 ENCOUNTER — Encounter: Payer: Self-pay | Admitting: Internal Medicine

## 2023-03-29 ENCOUNTER — Encounter: Payer: Self-pay | Admitting: Internal Medicine

## 2023-03-29 NOTE — Telephone Encounter (Signed)
 Appt scheduled, patient aware.

## 2023-03-30 ENCOUNTER — Ambulatory Visit (INDEPENDENT_AMBULATORY_CARE_PROVIDER_SITE_OTHER): Payer: Medicare HMO | Admitting: Internal Medicine

## 2023-03-30 ENCOUNTER — Encounter: Payer: Self-pay | Admitting: Internal Medicine

## 2023-03-30 VITALS — BP 152/80 | HR 96 | Ht 69.0 in | Wt 213.2 lb

## 2023-03-30 DIAGNOSIS — R062 Wheezing: Secondary | ICD-10-CM

## 2023-03-30 DIAGNOSIS — J0110 Acute frontal sinusitis, unspecified: Secondary | ICD-10-CM | POA: Diagnosis not present

## 2023-03-30 MED ORDER — AMOXICILLIN-POT CLAVULANATE 875-125 MG PO TABS: 1.0000 | ORAL_TABLET | Freq: Two times a day (BID) | ORAL | 0 refills | Status: DC

## 2023-03-30 NOTE — Assessment & Plan Note (Signed)
Likely has acute frontal and maxillary sinusitis Started empiric Augmentin as she has persistent symptoms despite symptomatic treatment Continue Mucinex as needed for cough Flonase for nasal congestion Can perform sinus rinse as tolerated

## 2023-03-30 NOTE — Progress Notes (Signed)
Acute Office Visit  Subjective:    Patient ID: Janet Gardner, female    DOB: 11/07/1955, 68 y.o.   MRN: [id number]  Chief Complaint  Patient presents with   Cough    Pt reports ongoing cough, headache, and sinus pressure.     HPI Patient is in today for complaint of nasal congestion, sinus pressure related headache and dry cough for the last 1 week.  She has tried taking Mucinex for cough with mild relief.  She has also used nasal saline spray and Flonase for congestion without much relief.  Denies fever, chills, dyspnea or wheezing currently.  Denies any recent sick sick contacts.  Her BP was elevated today, but she reports that she has not taken her amlodipine and olmesartan today.  Past Medical History:  Diagnosis Date   B12 deficiency    monthly injection   Fibromyalgia    GERD (gastroesophageal reflux disease)    Hyperlipidemia    Hypothyroidism    Osteopenia 06/11/2020    Past Surgical History:  Procedure Laterality Date   ABDOMINAL HYSTERECTOMY     CHOLECYSTECTOMY     colonoscopy  2007   Dr. Laurell Josephs, incomplete due to poor prep. scope passed to transverse colon. ACBE normal.   ESOPHAGOGASTRODUODENOSCOPY  07/22/2009   [id number] apperaring esophagus s/p 56 dilator/small HH/large ulcerated gastric polyp in the prepyloric antral area. Inflammatory fibroid polyp.   ESOPHAGOGASTRODUODENOSCOPY (EGD) WITH ESOPHAGEAL DILATION N/A 11/24/2012   Procedure: ESOPHAGOGASTRODUODENOSCOPY (EGD) WITH ESOPHAGEAL DILATION;  Surgeon: Corbin Ade, MD;  Location: [facility];  Service: Endoscopy;  Laterality: N/A;  9:30AM   FOOT SURGERY     Right   KIDNEY SURGERY      Family History  Problem Relation Age of Onset   Breast cancer Mother    Heart disease Father    Colon cancer Neg Hx     Social History   Socioeconomic History   Marital status: Married    Spouse name: Not on file   Number of children: 1   Years of education: Not on file   Highest education level: Not on file   Occupational History   Occupation: retired    Comment: retired  Tobacco Use   Smoking status: Never   Smokeless tobacco: Never  Vaping Use   Vaping status: Never Used  Substance and Sexual Activity   Alcohol use: No   Drug use: No   Sexual activity: Not on file  Other Topics Concern   Not on file  Social History Narrative   Lives with her husband and her 27 year old granddaughter. Her daughter lives in [place]   Social Drivers of Health   Financial Resource Strain: Low Risk  (06/09/2022)   Overall Financial Resource Strain (CARDIA)    Difficulty of Paying Living Expenses: Not hard at all  Food Insecurity: No Food Insecurity (06/09/2022)   Hunger Vital Sign    Worried About Running Out of Food in the Last Year: Never true    Ran Out of Food in the Last Year: Never true  Transportation Needs: No Transportation Needs (06/09/2022)   PRAPARE - Administrator, Civil Service (Medical): No    Lack of Transportation (Non-Medical): No  Physical Activity: Insufficiently Active (06/09/2022)   Exercise Vital Sign    Days of Exercise per Week: 3 days    Minutes of Exercise per Session: 30 min  Stress: No Stress Concern Present (06/09/2022)   [employer] of Occupational Health - Occupational  Stress Questionnaire    Feeling of Stress : Not at all  Social Connections: Socially Integrated (06/09/2022)   Social Connection and Isolation Panel [NHANES]    Frequency of Communication with Friends and Family: More than three times a week    Frequency of Social Gatherings with Friends and Family: More than three times a week    Attends Religious Services: More than 4 times per year    Active Member of [employer] or Organizations: Yes    Attends Engineer, structural: More than 4 times per year    Marital Status: Married  Catering manager Violence: Not At Risk (06/09/2022)   Humiliation, Afraid, Rape, and Kick questionnaire    Fear of Current or Ex-Partner: No    Emotionally  Abused: No    Physically Abused: No    Sexually Abused: No    Outpatient Medications Prior to Visit  Medication Sig Dispense Refill   amLODipine (NORVASC) 5 MG tablet Take 1 tablet (5 mg total) by mouth daily. 90 tablet 1   cyclobenzaprine (FLEXERIL) 5 MG tablet Take 1 tablet (5 mg total) by mouth 3 (three) times daily as needed for muscle spasms. 30 tablet 1   DULoxetine (CYMBALTA) 30 MG capsule Take 1 capsule (30 mg total) by mouth daily. 30 capsule 3   folic acid-vitamin b complex-vitamin c-selenium-zinc (DIALYVITE) 3 MG TABS tablet Take 1 tablet by mouth daily.     levothyroxine (SYNTHROID) 112 MCG tablet TAKE 1 TABLET EVERY DAY BEFORE BREAKFAST 90 tablet 3   olmesartan (BENICAR) 20 MG tablet TAKE 1 TABLET EVERY DAY 90 tablet 3   promethazine-dextromethorphan (PROMETHAZINE-DM) 6.25-15 MG/5ML syrup Take 5 mLs by mouth 4 (four) times daily as needed. 118 mL 0   vitamin B-12 (CYANOCOBALAMIN) 500 MCG tablet Take 500 mcg by mouth daily.     No facility-administered medications prior to visit.    Allergies  Allergen Reactions   Azithromycin Other (See Comments)    Caused hematuria.   Levaquin [Levofloxacin] Nausea And Vomiting   Sulfa Antibiotics Hives    Vomiting    Review of Systems  Constitutional:  Negative for chills and fever.  HENT:  Positive for congestion, postnasal drip, sinus pressure and sinus pain.   Eyes:  Negative for pain and discharge.  Respiratory:  Positive for cough. Negative for shortness of breath.   Cardiovascular:  Negative for chest pain and palpitations.  Gastrointestinal:  Negative for abdominal pain, diarrhea, nausea and vomiting.  Endocrine: Negative for polydipsia and polyuria.  Genitourinary:  Negative for dysuria and hematuria.  Musculoskeletal:  Negative for neck pain and neck stiffness.  Skin:  Negative for rash.  Neurological:  Negative for dizziness and weakness.  Psychiatric/Behavioral:  Negative for agitation and behavioral problems.         Objective:    Physical Exam Vitals reviewed.  Constitutional:      General: She is not in acute distress.    Appearance: She is not diaphoretic.  HENT:     Head: Normocephalic and atraumatic.     Nose: Congestion present.     Right Sinus: Maxillary sinus tenderness and frontal sinus tenderness present.     Left Sinus: Maxillary sinus tenderness and frontal sinus tenderness present.     Mouth/Throat:     Mouth: Mucous membranes are moist.     Pharynx: Posterior oropharyngeal erythema present.  Eyes:     General: No scleral icterus.    Extraocular Movements: Extraocular movements intact.  Cardiovascular:  Rate and Rhythm: Normal rate and regular rhythm.     Pulses: Normal pulses.     Heart sounds: No murmur heard. Pulmonary:     Breath sounds: Normal breath sounds. No wheezing or rales.  Abdominal:     Palpations: Abdomen is soft.     Tenderness: There is no abdominal tenderness.  Musculoskeletal:     Cervical back: Neck supple. No tenderness.     Right lower leg: No edema.     Left lower leg: No edema.  Skin:    General: Skin is warm.     Findings: No rash.  Neurological:     General: No focal deficit present.     Mental Status: She is alert and oriented to person, place, and time.  Psychiatric:        Mood and Affect: Mood normal.        Behavior: Behavior normal.     BP (!) 152/80 (BP Location: Left Arm)   Pulse 96   Ht 5\' 9"  (1.753 m)   Wt 213 lb 3.2 oz (96.7 kg)   SpO2 98%   BMI 31.48 kg/m  Wt Readings from Last 3 Encounters:  03/30/23 213 lb 3.2 oz (96.7 kg)  02/08/23 213 lb 1.3 oz (96.7 kg)  12/29/22 214 lb 9.6 oz (97.3 kg)        Assessment & Plan:   Problem List Items Addressed This Visit       Respiratory   Acute non-recurrent frontal sinusitis - Primary   Likely has acute frontal and maxillary sinusitis Started empiric Augmentin as she has persistent symptoms despite symptomatic treatment Continue Mucinex as needed for cough Flonase  for nasal congestion Can perform sinus rinse as tolerated      Relevant Medications   amoxicillin-clavulanate (AUGMENTIN) 875-125 MG tablet     Meds ordered this encounter  Medications   amoxicillin-clavulanate (AUGMENTIN) 875-125 MG tablet    Sig: Take 1 tablet by mouth 2 (two) times daily.    Dispense:  14 tablet    Refill:  0     Zachariah Pavek Concha Se, MD

## 2023-03-30 NOTE — Patient Instructions (Signed)
Please start taking Augmentin as prescribed.  Please use Flonase for nasal congestion/allergies.  Please continue taking Mucinex as needed for cough.

## 2023-04-04 ENCOUNTER — Encounter: Payer: Self-pay | Admitting: Internal Medicine

## 2023-04-05 ENCOUNTER — Ambulatory Visit: Payer: Self-pay | Admitting: Internal Medicine

## 2023-04-05 MED ORDER — PROMETHAZINE-DM 6.25-15 MG/5ML PO SYRP: 5.0000 mL | ORAL_SOLUTION | Freq: Four times a day (QID) | ORAL | 0 refills | Status: DC | PRN

## 2023-04-05 MED ORDER — ALBUTEROL SULFATE HFA 108 (90 BASE) MCG/ACT IN AERS: 2.0000 | INHALATION_SPRAY | Freq: Four times a day (QID) | RESPIRATORY_TRACT | 0 refills | Status: DC | PRN

## 2023-04-05 NOTE — Addendum Note (Signed)
 Addended byTrena Platt on: 04/05/2023 08:46 AM   Modules accepted: Orders

## 2023-04-13 ENCOUNTER — Ambulatory Visit
Admission: EM | Admit: 2023-04-13 | Discharge: 2023-04-13 | Disposition: A | Discharge status: Home / Self Care | Attending: Family Medicine | Admitting: Family Medicine

## 2023-04-13 ENCOUNTER — Ambulatory Visit: Payer: Medicare HMO | Admitting: Internal Medicine

## 2023-04-13 ENCOUNTER — Encounter: Payer: Self-pay | Admitting: Internal Medicine

## 2023-04-13 DIAGNOSIS — N390 Urinary tract infection, site not specified: Secondary | ICD-10-CM | POA: Insufficient documentation

## 2023-04-13 LAB — POCT URINALYSIS DIP (MANUAL ENTRY)
Bilirubin, UA: NEGATIVE
Glucose, UA: NEGATIVE mg/dL
Ketones, POC UA: NEGATIVE mg/dL
Nitrite, UA: POSITIVE — AB
Protein Ur, POC: 30 mg/dL — AB
Spec Grav, UA: 1.025 (ref 1.010–1.025)
Urobilinogen, UA: 1 U/dL
pH, UA: 5.5 (ref 5.0–8.0)

## 2023-04-13 MED ORDER — CEPHALEXIN 500 MG PO CAPS: 500.0000 mg | ORAL_CAPSULE | Freq: Two times a day (BID) | ORAL | 0 refills | Status: DC

## 2023-04-13 MED ORDER — PHENAZOPYRIDINE HCL 100 MG PO TABS: 100.0000 mg | ORAL_TABLET | Freq: Three times a day (TID) | ORAL | 0 refills | Status: DC | PRN

## 2023-04-13 NOTE — ED Provider Notes (Signed)
 [facility]    CSN: [id number] Arrival date & time: 04/13/23  3244      History   Chief Complaint No chief complaint on file.   HPI Janet Gardner is a 68 y.o. female.   Patient presenting today with 2-day history of dysuria, foul-smelling urine, lower abdominal pressure.  Denies fever, chills, flank pain, hematuria, nausea vomiting or diarrhea.  So far not trying to thing over-the-counter for symptoms.  History of recurrent urinary tract infections.    Past Medical History:  Diagnosis Date   B12 deficiency    monthly injection   Fibromyalgia    GERD (gastroesophageal reflux disease)    Hyperlipidemia    Hypothyroidism    Osteopenia 06/11/2020    Patient Active Problem List   Diagnosis Date Noted   Acute non-recurrent frontal sinusitis 03/30/2023   Viral illness 02/08/2023   Impacted cerumen of right ear 02/08/2023   Need for influenza vaccination 11/23/2022   Mixed hyperlipidemia 05/19/2022   Primary hypertension 06/11/2020   Chronic cystitis with hematuria 09/14/2019   Urge incontinence of urine 06/22/2019   PVC (premature ventricular contraction) 05/10/2019   Fibromyalgia muscle pain 09/02/2018   Vitamin D deficiency 09/02/2018   Vitamin B12 deficiency 09/02/2018   Osteopenia determined by x-ray 09/02/2018   H/O: hysterectomy 09/02/2018   History of esophageal dilatation 09/02/2018   History of cholecystectomy 09/02/2018   Acquired hypothyroidism 06/14/2013   GERD 07/11/2009    Past Surgical History:  Procedure Laterality Date   ABDOMINAL HYSTERECTOMY     CHOLECYSTECTOMY     colonoscopy  2007   Dr. Laurell Josephs, incomplete due to poor prep. scope passed to transverse colon. ACBE normal.   ESOPHAGOGASTRODUODENOSCOPY  07/22/2009   [id number] apperaring esophagus s/p 56 dilator/small HH/large ulcerated gastric polyp in the prepyloric antral area. Inflammatory fibroid polyp.   ESOPHAGOGASTRODUODENOSCOPY (EGD) WITH ESOPHAGEAL DILATION N/A 11/24/2012    Procedure: ESOPHAGOGASTRODUODENOSCOPY (EGD) WITH ESOPHAGEAL DILATION;  Surgeon: Corbin Ade, MD;  Location: [facility];  Service: Endoscopy;  Laterality: N/A;  9:30AM   FOOT SURGERY     Right   KIDNEY SURGERY      OB History   No obstetric history on file.      Home Medications    Prior to Admission medications   Medication Sig Start Date End Date Taking? Authorizing Provider  cephALEXin (KEFLEX) 500 MG capsule Take 1 capsule (500 mg total) by mouth 2 (two) times daily. 04/13/23  Yes Particia Nearing, PA-C  phenazopyridine (PYRIDIUM) 100 MG tablet Take 1 tablet (100 mg total) by mouth 3 (three) times daily as needed for pain. 04/13/23  Yes Particia Nearing, PA-C  albuterol (VENTOLIN HFA) 108 (90 Base) MCG/ACT inhaler Inhale 2 puffs into the lungs every 6 (six) hours as needed for wheezing or shortness of breath. 04/05/23   Anabel Halon, MD  amLODipine (NORVASC) 5 MG tablet Take 1 tablet (5 mg total) by mouth daily. 01/25/23   Billie Lade, MD  amoxicillin-clavulanate (AUGMENTIN) 875-125 MG tablet Take 1 tablet by mouth 2 (two) times daily. 03/30/23   Anabel Halon, MD  cyclobenzaprine (FLEXERIL) 5 MG tablet Take 1 tablet (5 mg total) by mouth 3 (three) times daily as needed for muscle spasms. 10/01/22   Sonny Masters, FNP  DULoxetine (CYMBALTA) 30 MG capsule Take 1 capsule (30 mg total) by mouth daily. 03/15/23   Billie Lade, MD  folic acid-vitamin b complex-vitamin c-selenium-zinc (DIALYVITE) 3 MG TABS tablet Take 1  tablet by mouth daily.    [provider]  levothyroxine (SYNTHROID) 112 MCG tablet TAKE 1 TABLET EVERY DAY BEFORE BREAKFAST 07/13/22   Sonny Masters, FNP  olmesartan (BENICAR) 20 MG tablet TAKE 1 TABLET EVERY DAY 03/15/23   Billie Lade, MD  promethazine-dextromethorphan (PROMETHAZINE-DM) 6.25-15 MG/5ML syrup Take 5 mLs by mouth 4 (four) times daily as needed. 04/05/23   Anabel Halon, MD  vitamin B-12 (CYANOCOBALAMIN) 500 MCG tablet Take  500 mcg by mouth daily.    [provider]    Family History Family History  Problem Relation Age of Onset   Breast cancer Mother    Heart disease Father    Colon cancer Neg Hx     Social History Social History   Tobacco Use   Smoking status: Never   Smokeless tobacco: Never  Vaping Use   Vaping status: Never Used  Substance Use Topics   Alcohol use: No   Drug use: No     Allergies   Azithromycin, Levaquin [levofloxacin], and Sulfa antibiotics   Review of Systems Review of Systems Per HPI  Physical Exam Triage Vital Signs ED Triage Vitals [04/13/23 1101]  Encounter Vitals Group     BP (!) 171/99     Systolic BP Percentile      Diastolic BP Percentile      Pulse Rate 93     Resp 18     Temp 98.3 F (36.8 C)     Temp Source Oral     SpO2 95 %     Weight      Height      Head Circumference      Peak Flow      Pain Score 0     Pain Loc      Pain Education      Exclude from Growth Chart    No data found.  Updated Vital Signs BP (!) 171/99 (BP Location: Right Arm)   Pulse 93   Temp 98.3 F (36.8 C) (Oral)   Resp 18   SpO2 95%   Visual Acuity Right Eye Distance:   Left Eye Distance:   Bilateral Distance:    Right Eye Near:   Left Eye Near:    Bilateral Near:     Physical Exam Vitals and nursing note reviewed.  Constitutional:      Appearance: Normal appearance.  HENT:     Head: Atraumatic.     Right Ear: Tympanic membrane and external ear normal.     Left Ear: Tympanic membrane and external ear normal.     Nose: Rhinorrhea present.     Mouth/Throat:     Mouth: Mucous membranes are moist.     Pharynx: Posterior oropharyngeal erythema present.  Eyes:     Extraocular Movements: Extraocular movements intact.     Conjunctiva/sclera: Conjunctivae normal.  Cardiovascular:     Rate and Rhythm: Normal rate and regular rhythm.     Heart sounds: Normal heart sounds.  Pulmonary:     Effort: Pulmonary effort is normal.     Breath  sounds: Normal breath sounds. No wheezing or rales.  Musculoskeletal:        General: Normal range of motion.     Cervical back: Normal range of motion and neck supple.  Skin:    General: Skin is warm and dry.  Neurological:     Mental Status: She is alert and oriented to person, place, and time.  Psychiatric:  Mood and Affect: Mood normal.        Thought Content: Thought content normal.      UC Treatments / Results  Labs (all labs ordered are listed, but only abnormal results are displayed) Labs Reviewed  POCT URINALYSIS DIP (MANUAL ENTRY) - Abnormal; Notable for the following components:      Result Value   Clarity, UA cloudy (*)    Blood, UA small (*)    Protein Ur, POC =30 (*)    Nitrite, UA Positive (*)    Leukocytes, UA Large (3+) (*)    All other components within normal limits  URINE CULTURE    EKG   Radiology No results found.  Procedures Procedures (including critical care time)  Medications Ordered in UC Medications - No data to display  Initial Impression / Assessment and Plan / UC Course  I have reviewed the triage vital signs and the nursing notes.  Pertinent labs & imaging results that were available during my care of the patient were reviewed by me and considered in my medical decision making (see chart for details).     Urinalysis today with evidence of a urinary tract infection.  Will treat with Keflex, pre-DM, fluids and await urine culture.  Adjust if needed.  Return for worsening symptoms.  Final Clinical Impressions(s) / UC Diagnoses   Final diagnoses:  Acute lower UTI   Discharge Instructions   None    ED Prescriptions     Medication Sig Dispense Auth. Provider   cephALEXin (KEFLEX) 500 MG capsule Take 1 capsule (500 mg total) by mouth 2 (two) times daily. 10 capsule Particia Nearing, [place]   phenazopyridine (PYRIDIUM) 100 MG tablet Take 1 tablet (100 mg total) by mouth 3 (three) times daily as needed for pain. 10  tablet Particia Nearing, [place]      PDMP not reviewed this encounter.   Particia Nearing, [place] 04/13/23 [phone]

## 2023-04-13 NOTE — ED Triage Notes (Signed)
 Pt reports she has burning with urination, foul smelling odor, low abdominal pain on left side x 2 days

## 2023-04-15 LAB — URINE CULTURE: Culture: >=100000 — AB

## 2023-04-30 ENCOUNTER — Encounter: Payer: Self-pay | Admitting: Internal Medicine

## 2023-05-01 ENCOUNTER — Other Ambulatory Visit: Payer: Self-pay | Admitting: Family Medicine

## 2023-05-01 DIAGNOSIS — E039 Hypothyroidism, unspecified: Secondary | ICD-10-CM

## 2023-06-07 ENCOUNTER — Other Ambulatory Visit: Payer: Self-pay

## 2023-06-07 ENCOUNTER — Ambulatory Visit (INDEPENDENT_AMBULATORY_CARE_PROVIDER_SITE_OTHER): Admitting: Internal Medicine

## 2023-06-07 ENCOUNTER — Encounter: Payer: Self-pay | Admitting: Internal Medicine

## 2023-06-07 VITALS — BP 125/81 | HR 87 | Ht 69.0 in | Wt 214.8 lb

## 2023-06-07 DIAGNOSIS — E669 Obesity, unspecified: Secondary | ICD-10-CM | POA: Diagnosis not present

## 2023-06-07 DIAGNOSIS — E039 Hypothyroidism, unspecified: Secondary | ICD-10-CM

## 2023-06-07 DIAGNOSIS — E559 Vitamin D deficiency, unspecified: Secondary | ICD-10-CM | POA: Diagnosis not present

## 2023-06-07 DIAGNOSIS — I10 Essential (primary) hypertension: Secondary | ICD-10-CM

## 2023-06-07 DIAGNOSIS — M8580 Other specified disorders of bone density and structure, unspecified site: Secondary | ICD-10-CM | POA: Diagnosis not present

## 2023-06-07 DIAGNOSIS — E538 Deficiency of other specified B group vitamins: Secondary | ICD-10-CM | POA: Diagnosis not present

## 2023-06-07 DIAGNOSIS — K219 Gastro-esophageal reflux disease without esophagitis: Secondary | ICD-10-CM

## 2023-06-07 DIAGNOSIS — M797 Fibromyalgia: Secondary | ICD-10-CM

## 2023-06-07 DIAGNOSIS — S76011A Strain of muscle, fascia and tendon of right hip, initial encounter: Secondary | ICD-10-CM | POA: Insufficient documentation

## 2023-06-07 DIAGNOSIS — E782 Mixed hyperlipidemia: Secondary | ICD-10-CM | POA: Diagnosis not present

## 2023-06-07 DIAGNOSIS — D539 Nutritional anemia, unspecified: Secondary | ICD-10-CM | POA: Diagnosis not present

## 2023-06-07 MED ORDER — LEVOTHYROXINE SODIUM 112 MCG PO TABS: 112.0000 ug | ORAL_TABLET | Freq: Every day | ORAL | 3 refills | Status: DC

## 2023-06-07 NOTE — Assessment & Plan Note (Addendum)
 Her acute concern today is right posterior hip pain.  On exam she endorses tenderness to palpation in the right gluteal region.  Suspect gluteal strain.  Negative logroll.  There is no tenderness to palpation over the greater trochanteric bursa.  Treatment options reviewed.  Continue as needed use of Flexeril /Tylenol  for pain relief.  Will add home PT exercises.  She was instructed to return to care if symptoms worsen or fail to improve.

## 2023-06-07 NOTE — Assessment & Plan Note (Addendum)
 Currently prescribed levothyroxine  112 mcg daily.  She endorses recent night sweats and believes that her dose may be too high as she experienced similar symptoms when her levothyroxine  dose was too high.  Repeat thyroid  studies ordered today.

## 2023-06-07 NOTE — Assessment & Plan Note (Addendum)
 Adequately controlled on current antihypertensive regimen.  No medication changes are indicated today.

## 2023-06-07 NOTE — Assessment & Plan Note (Addendum)
 Noted on DEXA from 2022.  She is currently on vitamin D  and calcium  supplementation.  Repeat DEXA ordered today.

## 2023-06-07 NOTE — Patient Instructions (Signed)
 It was a pleasure to see you today.  Thank you for giving us  the opportunity to be involved in your care.  Below is a brief recap of your visit and next steps.  We will plan to see you again in 3 months.  Summary Repeat labs ordered today Please see the attached exercises for your hip / low back strain Follow up in 3 months

## 2023-06-07 NOTE — Assessment & Plan Note (Signed)
 Symptoms are well-controlled with as needed use of Flexeril  and Tylenol .

## 2023-06-07 NOTE — Progress Notes (Signed)
 Established Patient Office Visit  Subjective   Patient ID: Janet Gardner, female    DOB: [birth year]  Age: 68 y.o. MRN: [id number]  Chief Complaint  Patient presents with   Care Management    Three month follow up   Hip Pain    Right hip pain    Janet Gardner returns today for follow up.  She was last evaluated by me in November 2024 for HTN follow-up.  Amlodipine  5 mg daily was added to her antihypertensive regimen.  72-month follow-up was arranged for reassessment.  In the interim, she has presented to [facility] for acute visits on 12/30 and 2/18 endorsing URI symptoms and sinus congestion respectively.  Urgent care presentation on 3/4 endorsing symptoms concerning for UTI.  Treated empirically with Keflex .  There have otherwise been no acute interval events.  Today she reports feeling fairly well but endorses right posterior/lateral hip pain.  She has experienced similar symptoms previously and describes receiving a corticosteroid injection.  She additionally endorses night sweats, which occurred previously when her levothyroxine  dose was too high.  She would like to update her labs today.  Past Medical History:  Diagnosis Date   B12 deficiency    monthly injection   Fibromyalgia    GERD (gastroesophageal reflux disease)    Hyperlipidemia    Hypothyroidism    Osteopenia 06/11/2020   Past Surgical History:  Procedure Laterality Date   ABDOMINAL HYSTERECTOMY     CHOLECYSTECTOMY     colonoscopy  2007   Janet Gardner, incomplete due to poor prep. scope passed to transverse colon. ACBE normal.   ESOPHAGOGASTRODUODENOSCOPY  07/22/2009   NWG:NFAOZH apperaring esophagus s/p 56 dilator/small HH/large ulcerated gastric polyp in the prepyloric antral area. Inflammatory fibroid polyp.   ESOPHAGOGASTRODUODENOSCOPY (EGD) WITH ESOPHAGEAL DILATION N/A 11/24/2012   Procedure: ESOPHAGOGASTRODUODENOSCOPY (EGD) WITH ESOPHAGEAL DILATION;  Surgeon: Janet Espy, MD;  Location: [facility];  Service: Endoscopy;   Laterality: N/A;  9:30AM   FOOT SURGERY     Right   KIDNEY SURGERY     Social History   Tobacco Use   Smoking status: Never   Smokeless tobacco: Never  Vaping Use   Vaping status: Never Used  Substance Use Topics   Alcohol use: No   Drug use: No   Family History  Problem Relation Age of Onset   Breast cancer Mother    Heart disease Father    Colon cancer Neg Hx    Allergies  Allergen Reactions   Azithromycin Other (See Comments)    Caused hematuria.   Levaquin [Levofloxacin] Nausea And Vomiting   Sulfa Antibiotics Hives    Vomiting   Review of Systems  Constitutional:  Positive for diaphoresis (night sweats). Negative for chills and fever.  HENT:  Negative for sore throat.   Respiratory:  Negative for cough and shortness of breath.   Cardiovascular:  Negative for chest pain, palpitations and leg swelling.  Gastrointestinal:  Negative for abdominal pain, blood in stool, constipation, diarrhea, nausea and vomiting.  Genitourinary:  Negative for dysuria and hematuria.  Musculoskeletal:  Positive for joint pain (Right hip pain). Negative for myalgias.  Skin:  Negative for itching and rash.  Neurological:  Negative for dizziness and headaches.  Psychiatric/Behavioral:  Negative for depression and suicidal ideas.      Objective:     BP 125/81   Pulse 87   Ht 5\' 9"  (1.753 m)   Wt 214 lb 12.8 oz (97.4 kg)   SpO2 96%  BMI 31.72 kg/m  BP Readings from Last 3 Encounters:  06/07/23 125/81  04/13/23 (!) 171/99  03/30/23 (!) 152/80   Physical Exam Vitals reviewed.  Constitutional:      General: She is not in acute distress.    Appearance: Normal appearance. She is obese. She is not toxic-appearing.  HENT:     Head: Normocephalic and atraumatic.     Right Ear: External ear normal.     Left Ear: External ear normal.     Nose: Nose normal. No congestion or rhinorrhea.     Mouth/Throat:     Mouth: Mucous membranes are moist.     Pharynx: Oropharynx is clear. No  oropharyngeal exudate or posterior oropharyngeal erythema.  Eyes:     General: No scleral icterus.    Extraocular Movements: Extraocular movements intact.     Conjunctiva/sclera: Conjunctivae normal.     Pupils: Pupils are equal, round, and reactive to light.  Cardiovascular:     Rate and Rhythm: Normal rate and regular rhythm.     Pulses: Normal pulses.     Heart sounds: Normal heart sounds. No murmur heard.    No friction rub. No gallop.  Pulmonary:     Effort: Pulmonary effort is normal.     Breath sounds: Normal breath sounds. No wheezing, rhonchi or rales.  Abdominal:     General: Abdomen is flat. Bowel sounds are normal. There is no distension.     Palpations: Abdomen is soft.     Tenderness: There is no abdominal tenderness.  Musculoskeletal:        General: No swelling. Normal range of motion.     Cervical back: Normal range of motion.     Right lower leg: No edema.     Left lower leg: No edema.     Comments: There is tenderness to palpation of the right gluteal region. She retains full active and passive ROM. Negative log roll.  Lymphadenopathy:     Cervical: No cervical adenopathy.  Skin:    General: Skin is warm and dry.     Capillary Refill: Capillary refill takes less than 2 seconds.     Coloration: Skin is not jaundiced.  Neurological:     General: No focal deficit present.     Mental Status: She is alert and oriented to person, place, and time.  Psychiatric:        Mood and Affect: Mood normal.        Behavior: Behavior normal.   Last CBC Lab Results  Component Value Date   WBC 5.5 10/01/2022   HGB 13.2 10/01/2022   HCT 41.2 10/01/2022   MCV 83 10/01/2022   MCH 26.5 (L) 10/01/2022   RDW 14.6 10/01/2022   PLT 256 10/01/2022   Last metabolic panel Lab Results  Component Value Date   GLUCOSE 136 (H) 12/29/2022   NA 140 12/29/2022   K 4.0 12/29/2022   CL 105 12/29/2022   CO2 21 12/29/2022   BUN 13 12/29/2022   CREATININE 0.66 12/29/2022   EGFR 95  12/29/2022   CALCIUM  8.6 (L) 12/29/2022   PROT 6.7 05/19/2022   ALBUMIN 3.9 05/19/2022   LABGLOB 2.8 05/19/2022   AGRATIO 1.4 05/19/2022   BILITOT 0.2 05/19/2022   ALKPHOS 89 05/19/2022   AST 20 05/19/2022   ALT 13 05/19/2022   Last lipids Lab Results  Component Value Date   CHOL 194 05/19/2022   HDL 41 05/19/2022   LDLCALC 127 (H) 05/19/2022   TRIG  146 05/19/2022   CHOLHDL 4.7 (H) 05/19/2022   Last thyroid  functions Lab Results  Component Value Date   TSH 1.880 05/19/2022   T4TOTAL 12.1 (H) 05/19/2022   Last vitamin D  Lab Results  Component Value Date   VD25OH 31.4 05/19/2022   Last vitamin B12 and Folate Lab Results  Component Value Date   VITAMINB12 323 05/19/2022   The 10-year ASCVD risk score (Arnett DK, et al., 2019) is: 12.6%    Assessment & Plan:   Problem List Items Addressed This Visit       Primary hypertension - Primary   Adequately controlled on current antihypertensive regimen.  No medication changes are indicated today.      Acquired hypothyroidism   Currently prescribed levothyroxine  112 mcg daily.  She endorses recent night sweats and believes that her dose may be too high as she experienced similar symptoms when her levothyroxine  dose was too high.  Repeat thyroid  studies ordered today.      Osteopenia determined by x-ray   Noted on DEXA from 2022.  She is currently on vitamin D  and calcium  supplementation.  Repeat DEXA ordered today.      Muscle strain of right gluteal region   Her acute concern today is right posterior hip pain.  On exam she endorses tenderness to palpation in the right gluteal region.  Suspect gluteal strain.  Negative logroll.  There is no tenderness to palpation over the greater trochanteric bursa.  Treatment options reviewed.  Continue as needed use of Flexeril /Tylenol  for pain relief.  Will add home PT exercises.  She was instructed to return to care if symptoms worsen or fail to improve.      Fibromyalgia muscle  pain   Symptoms are well-controlled with as needed use of Flexeril  and Tylenol .      Return in about 3 months (around 09/06/2023).   Tobi Fortes, MD

## 2023-06-08 ENCOUNTER — Encounter: Payer: Self-pay | Admitting: Internal Medicine

## 2023-06-08 LAB — CMP14+EGFR
ALT: 14 IU/L (ref 0–32)
AST: 17 IU/L (ref 0–40)
Albumin: 4 g/dL (ref 3.9–4.9)
Alkaline Phosphatase: 88 IU/L (ref 44–121)
BUN/Creatinine Ratio: 16 (ref 12–28)
BUN: 11 mg/dL (ref 8–27)
Bilirubin Total: 0.3 mg/dL (ref 0.0–1.2)
CO2: 22 mmol/L (ref 20–29)
Calcium: 8.7 mg/dL (ref 8.7–10.3)
Chloride: 106 mmol/L (ref 96–106)
Creatinine, Ser: 0.67 mg/dL (ref 0.57–1.00)
Globulin, Total: 3.1 g/dL (ref 1.5–4.5)
Glucose: 80 mg/dL (ref 70–99)
Potassium: 3.9 mmol/L (ref 3.5–5.2)
Sodium: 141 mmol/L (ref 134–144)
Total Protein: 7.1 g/dL (ref 6.0–8.5)
eGFR: 94 mL/min/{1.73_m2} (ref >59)

## 2023-06-08 LAB — HEMOGLOBIN A1C
Est. average glucose Bld gHb Est-mCnc: 123 mg/dL
Hgb A1c MFr Bld: 5.9 % — ABNORMAL HIGH (ref 4.8–5.6)

## 2023-06-08 LAB — B12 AND FOLATE PANEL
Folate: 8.3 ng/mL (ref >3.0)
Vitamin B-12: 218 pg/mL — ABNORMAL LOW (ref 232–1245)

## 2023-06-08 LAB — CBC WITH DIFFERENTIAL/PLATELET
Basophils Absolute: 0.1 10*3/uL (ref 0.0–0.2)
Basos: 1 %
EOS (ABSOLUTE): 0.2 10*3/uL (ref 0.0–0.4)
Eos: 3 %
Hematocrit: 41.1 % (ref 34.0–46.6)
Hemoglobin: 13.2 g/dL (ref 11.1–15.9)
Immature Grans (Abs): 0 10*3/uL (ref 0.0–0.1)
Immature Granulocytes: 0 %
Lymphocytes Absolute: 1.1 10*3/uL (ref 0.7–3.1)
Lymphs: 19 %
MCH: 27.2 pg (ref 26.6–33.0)
MCHC: 32.1 g/dL (ref 31.5–35.7)
MCV: 85 fL (ref 79–97)
Monocytes Absolute: 0.4 10*3/uL (ref 0.1–0.9)
Monocytes: 8 %
Neutrophils Absolute: 3.8 10*3/uL (ref 1.4–7.0)
Neutrophils: 69 %
Platelets: 251 10*3/uL (ref 150–450)
RBC: 4.86 x10E6/uL (ref 3.77–5.28)
RDW: 14.1 % (ref 11.7–15.4)
WBC: 5.5 10*3/uL (ref 3.4–10.8)

## 2023-06-08 LAB — TSH+FREE T4
Free T4: 1.44 ng/dL (ref 0.82–1.77)
TSH: 5.42 u[IU]/mL — ABNORMAL HIGH (ref 0.450–4.500)

## 2023-06-08 LAB — LIPID PANEL
Chol/HDL Ratio: 4.6 ratio — ABNORMAL HIGH (ref 0.0–4.4)
Cholesterol, Total: 193 mg/dL (ref 100–199)
HDL: 42 mg/dL (ref >39)
LDL Chol Calc (NIH): 125 mg/dL — ABNORMAL HIGH (ref 0–99)
Triglycerides: 147 mg/dL (ref 0–149)
VLDL Cholesterol Cal: 26 mg/dL (ref 5–40)

## 2023-06-08 LAB — VITAMIN D 25 HYDROXY (VIT D DEFICIENCY, FRACTURES): Vit D, 25-Hydroxy: 27.3 ng/mL — ABNORMAL LOW (ref 30.0–100.0)

## 2023-06-12 ENCOUNTER — Other Ambulatory Visit: Payer: Self-pay

## 2023-06-12 ENCOUNTER — Encounter: Payer: Self-pay | Admitting: Internal Medicine

## 2023-06-12 ENCOUNTER — Emergency Department (HOSPITAL_COMMUNITY)
Admission: EM | Admit: 2023-06-12 | Discharge: 2023-06-12 | Disposition: A | Discharge status: Home / Self Care | Attending: Emergency Medicine | Admitting: Emergency Medicine

## 2023-06-12 ENCOUNTER — Encounter (HOSPITAL_COMMUNITY): Payer: Self-pay | Admitting: Emergency Medicine

## 2023-06-12 ENCOUNTER — Emergency Department (HOSPITAL_COMMUNITY)

## 2023-06-12 DIAGNOSIS — R42 Dizziness and giddiness: Secondary | ICD-10-CM | POA: Diagnosis not present

## 2023-06-12 DIAGNOSIS — R55 Syncope and collapse: Secondary | ICD-10-CM | POA: Diagnosis not present

## 2023-06-12 DIAGNOSIS — R110 Nausea: Secondary | ICD-10-CM | POA: Diagnosis not present

## 2023-06-12 LAB — CBC WITH DIFFERENTIAL/PLATELET
Abs Immature Granulocytes: 0.02 10*3/uL (ref 0.00–0.07)
Basophils Absolute: 0.1 10*3/uL (ref 0.0–0.1)
Basophils Relative: 1 %
Eosinophils Absolute: 0.2 10*3/uL (ref 0.0–0.5)
Eosinophils Relative: 3 %
HCT: 39.5 % (ref 36.0–46.0)
Hemoglobin: 12.9 g/dL (ref 12.0–15.0)
Immature Granulocytes: 0 %
Lymphocytes Relative: 17 %
Lymphs Abs: 1.1 10*3/uL (ref 0.7–4.0)
MCH: 27.5 pg (ref 26.0–34.0)
MCHC: 32.7 g/dL (ref 30.0–36.0)
MCV: 84.2 fL (ref 80.0–100.0)
Monocytes Absolute: 0.5 10*3/uL (ref 0.1–1.0)
Monocytes Relative: 8 %
Neutro Abs: 4.5 10*3/uL (ref 1.7–7.7)
Neutrophils Relative %: 71 %
Platelets: 231 10*3/uL (ref 150–400)
RBC: 4.69 MIL/uL (ref 3.87–5.11)
RDW: 14.4 % (ref 11.5–15.5)
WBC: 6.3 10*3/uL (ref 4.0–10.5)
nRBC: 0 % (ref 0.0–0.2)

## 2023-06-12 LAB — COMPREHENSIVE METABOLIC PANEL WITH GFR
ALT: 16 U/L (ref 0–44)
AST: 19 U/L (ref 15–41)
Albumin: 3.5 g/dL (ref 3.5–5.0)
Alkaline Phosphatase: 69 U/L (ref 38–126)
Anion gap: 9 (ref 5–15)
BUN: 19 mg/dL (ref 8–23)
CO2: 21 mmol/L — ABNORMAL LOW (ref 22–32)
Calcium: 8.6 mg/dL — ABNORMAL LOW (ref 8.9–10.3)
Chloride: 106 mmol/L (ref 98–111)
Creatinine, Ser: 0.91 mg/dL (ref 0.44–1.00)
GFR, Estimated: >60 mL/min (ref >60)
Glucose, Bld: 112 mg/dL — ABNORMAL HIGH (ref 70–99)
Potassium: 3.6 mmol/L (ref 3.5–5.1)
Sodium: 136 mmol/L (ref 135–145)
Total Bilirubin: 0.4 mg/dL (ref 0.0–1.2)
Total Protein: 7 g/dL (ref 6.5–8.1)

## 2023-06-12 LAB — MAGNESIUM: Magnesium: 2.3 mg/dL (ref 1.7–2.4)

## 2023-06-12 LAB — TROPONIN I (HIGH SENSITIVITY)
Troponin I (High Sensitivity): 3 ng/L (ref <18)
Troponin I (High Sensitivity): 3 ng/L (ref <18)

## 2023-06-12 LAB — CBG MONITORING, ED: Glucose-Capillary: 107 mg/dL — ABNORMAL HIGH (ref 70–99)

## 2023-06-12 MED ORDER — MECLIZINE HCL 12.5 MG PO TABS
25.0000 mg | ORAL_TABLET | Freq: Once | ORAL | Status: AC
  Administered 2023-06-12: 25 mg via ORAL
  Filled 2023-06-12: qty 2

## 2023-06-12 MED ORDER — MECLIZINE HCL 25 MG PO TABS: 25.0000 mg | ORAL_TABLET | Freq: Three times a day (TID) | ORAL | 0 refills | Status: AC | PRN

## 2023-06-12 NOTE — ED Notes (Signed)
 Pt/family received d/c paperwork at this time. After going over the paperwork any questions, comments, or concerns were answered to the best of this nurse's knowledge. The pt/family verbally acknowledged the teachings/instructions.

## 2023-06-12 NOTE — ED Triage Notes (Signed)
 Pt to ER states she had a sudden onset of dizziness that has now subsided, but she feels weak and like she has a tremor.  Pt states nausea, no vomiting, denies headache or other neurological symptoms.

## 2023-06-12 NOTE — ED Provider Notes (Signed)
 [place] EMERGENCY DEPARTMENT AT [facility] Provider Note   CSN: [id number] Arrival date & time: 06/12/23  1840     History  Chief Complaint  Patient presents with   Dizziness    Janet Gardner is a 68 y.o. female.  Patient is a 68 year old female who presents emergency department with a chief complaint of a sudden onset of dizziness just prior to arrival.  Patient notes that the dizziness lasted approximately 15 to 20 minutes and has since resolved.  Patient notes that she was unable to ambulate when the dizziness was occurring.  Patient denies any recent falls or blunt head trauma.  She is not any pain to her head, neck or back.  She denies any chest pain, palpitations, shortness of breath, abdominal pain, vomiting or diarrhea.  She did have associated nausea.  She has had previous dizziness in the past but notes that this has felt different.  She denies any numbness, paresthesias, unilateral weakness.  She denies any associated syncopal event.  Patient does note that she felt jittery after the event.   Dizziness      Home Medications Prior to Admission medications   Medication Sig Start Date End Date Taking? Authorizing Provider  albuterol  (VENTOLIN  HFA) 108 (90 Base) MCG/ACT inhaler Inhale 2 puffs into the lungs every 6 (six) hours as needed for wheezing or shortness of breath. 04/05/23   Meldon Sport, MD  amLODipine  (NORVASC ) 5 MG tablet Take 1 tablet (5 mg total) by mouth daily. 01/25/23   Tobi Fortes, MD  cyclobenzaprine  (FLEXERIL ) 5 MG tablet Take 1 tablet (5 mg total) by mouth 3 (three) times daily as needed for muscle spasms. 10/01/22   Galvin Jules, FNP  DULoxetine  (CYMBALTA ) 30 MG capsule Take 1 capsule (30 mg total) by mouth daily. 03/15/23   Dixon, Phillip E, MD  folic acid-vitamin b complex-vitamin c-selenium-zinc (DIALYVITE) 3 MG TABS tablet Take 1 tablet by mouth daily.    [provider]  levothyroxine  (SYNTHROID ) 112 MCG tablet Take 1  tablet (112 mcg total) by mouth daily before breakfast. 06/07/23   Tobi Fortes, MD  olmesartan  (BENICAR ) 20 MG tablet TAKE 1 TABLET EVERY DAY 03/15/23   Dixon, Phillip E, MD  phenazopyridine  (PYRIDIUM ) 100 MG tablet Take 1 tablet (100 mg total) by mouth 3 (three) times daily as needed for pain. 04/13/23   Corbin Dess, PA-C  promethazine -dextromethorphan (PROMETHAZINE -DM) 6.25-15 MG/5ML syrup Take 5 mLs by mouth 4 (four) times daily as needed. 04/05/23   Meldon Sport, MD  vitamin B-12 (CYANOCOBALAMIN ) 500 MCG tablet Take 500 mcg by mouth daily.    [provider]      Allergies    Azithromycin, Levaquin [levofloxacin], and Sulfa antibiotics    Review of Systems   Review of Systems  Neurological:  Positive for dizziness.    Physical Exam Updated Vital Signs BP (!) 165/94   Pulse 82   Temp (!) 97.5 F (36.4 C)   Resp 18   Ht 5\' 9"  (1.753 m)   Wt 91.2 kg   SpO2 100%   BMI 29.68 kg/m  Physical Exam Vitals and nursing note reviewed.  Constitutional:      Appearance: Normal appearance.  HENT:     Head: Normocephalic and atraumatic.     Nose: Nose normal.     Mouth/Throat:     Mouth: Mucous membranes are moist.  Eyes:     Extraocular Movements: Extraocular movements intact.  Conjunctiva/sclera: Conjunctivae normal.     Pupils: Pupils are equal, round, and reactive to light.  Cardiovascular:     Rate and Rhythm: Normal rate and regular rhythm.     Pulses: Normal pulses.     Heart sounds: Normal heart sounds. No murmur heard.    No gallop.  Pulmonary:     Effort: Pulmonary effort is normal. No respiratory distress.     Breath sounds: Normal breath sounds. No stridor. No wheezing, rhonchi or rales.  Abdominal:     General: Abdomen is flat. Bowel sounds are normal. There is no distension.     Palpations: Abdomen is soft.     Tenderness: There is no abdominal tenderness. There is no guarding.  Musculoskeletal:        General: No swelling, tenderness or  signs of injury. Normal range of motion.     Cervical back: Normal range of motion and neck supple. No rigidity or tenderness.     Right lower leg: No edema.     Left lower leg: No edema.  Skin:    General: Skin is warm and dry.     Findings: No rash.  Neurological:     General: No focal deficit present.     Mental Status: She is alert and oriented to person, place, and time. Mental status is at baseline.     Cranial Nerves: No cranial nerve deficit.     Sensory: No sensory deficit.     Motor: No weakness.     Coordination: Coordination normal.     Gait: Gait normal.     Comments: Finger-nose intact, normal rapid alternating movements, normal heel-to-shin  Psychiatric:        Mood and Affect: Mood normal.        Behavior: Behavior normal.        Thought Content: Thought content normal.        Judgment: Judgment normal.     ED Results / Procedures / Treatments   Labs (all labs ordered are listed, but only abnormal results are displayed) Labs Reviewed  COMPREHENSIVE METABOLIC PANEL WITH GFR  CBC WITH DIFFERENTIAL/PLATELET  MAGNESIUM  CBG MONITORING, ED  TROPONIN I (HIGH SENSITIVITY)    EKG None  Radiology No results found.  Procedures Procedures    Medications Ordered in ED Medications  meclizine (ANTIVERT) tablet 25 mg (has no administration in time range)    ED Course/ Medical Decision Making/ A&P                                 Medical Decision Making Amount and/or Complexity of Data Reviewed Labs: ordered. Radiology: ordered.   This patient presents to the ED for concern of dizziness differential diagnosis includes vertigo, Mnire's disease, Lyme arthritis, CVA, TIA, electrolyte derangement    Additional history obtained:  Additional history obtained from none External records from outside source obtained and reviewed including none   Lab Tests:  I Ordered, and personally interpreted labs.  The pertinent results include: No leukocytosis, no  anemia, normal kidney function liver function, normal electrolytes, negative serial troponins, normal magnesium   Imaging Studies ordered:  I ordered imaging studies including CT scan of the head I independently visualized and interpreted imaging which showed no acute intracranial process I agree with the radiologist interpretation   Medicines ordered and prescription drug management:  I ordered medication including meclizine for dizziness Reevaluation of the patient after these medicines showed  that the patient resolved I have reviewed the patients home medicines and have made adjustments as needed   Problem List / ED Course:  Patient is doing well at this time and is stable for discharge home.  Dizziness has completely resolved with meclizine in the emergency department.  CT scan of the head was unremarkable.  Patient had no concerning neurological deficits on exam and suspect that CVA versus TIA is less likely.  Suspect vertigo at this time.  Blood work has been unremarkable as well.  EKG had no acute ischemic changes and patient had negative serial troponins.  Patient notes that she would like to be discharged home at this point.  The need for close follow-up on outpatient basis was discussed as well as strict turn precautions for any new or worsening symptoms.  Patient voiced understanding and had no additional questions.   Social Determinants of Health:  None           Final Clinical Impression(s) / ED Diagnoses Final diagnoses:  None    Rx / DC Orders ED Discharge Orders     None         Emmalene Hare 06/12/23 2209    Guadalupe Lee, MD 06/13/23 2026

## 2023-06-12 NOTE — ED Notes (Signed)
 Ambulated pt in hallway with no complaints

## 2023-06-12 NOTE — Discharge Instructions (Signed)
 Please follow-up closely with your primary care doctor and with neurology on outpatient basis.  Return to emergency department immediately for any new or worsening symptoms.

## 2023-06-13 ENCOUNTER — Encounter: Payer: Self-pay | Admitting: Internal Medicine

## 2023-06-14 ENCOUNTER — Encounter: Payer: Self-pay | Admitting: Neurology

## 2023-06-14 ENCOUNTER — Encounter: Payer: Self-pay | Admitting: Internal Medicine

## 2023-06-14 ENCOUNTER — Other Ambulatory Visit: Payer: Self-pay

## 2023-06-14 DIAGNOSIS — R42 Dizziness and giddiness: Secondary | ICD-10-CM

## 2023-07-20 ENCOUNTER — Other Ambulatory Visit: Payer: Self-pay | Admitting: Family Medicine

## 2023-07-20 ENCOUNTER — Other Ambulatory Visit: Payer: Self-pay

## 2023-07-20 DIAGNOSIS — M62838 Other muscle spasm: Secondary | ICD-10-CM

## 2023-07-20 MED ORDER — CYCLOBENZAPRINE HCL 5 MG PO TABS: 5.0000 mg | ORAL_TABLET | Freq: Three times a day (TID) | ORAL | 3 refills | Status: AC | PRN

## 2023-07-28 ENCOUNTER — Other Ambulatory Visit: Payer: Self-pay | Admitting: Internal Medicine

## 2023-07-28 DIAGNOSIS — I10 Essential (primary) hypertension: Secondary | ICD-10-CM

## 2023-08-02 ENCOUNTER — Telehealth: Payer: Self-pay

## 2023-08-02 NOTE — Telephone Encounter (Signed)
 Disability Placard Noted Copied Scanned Original in provider box Copy at front desk

## 2023-08-03 NOTE — Telephone Encounter (Signed)
Called patient for pick up

## 2023-08-16 ENCOUNTER — Ambulatory Visit: Payer: Self-pay

## 2023-08-16 ENCOUNTER — Ambulatory Visit
Admission: EM | Admit: 2023-08-16 | Discharge: 2023-08-16 | Disposition: A | Discharge status: Home / Self Care | Attending: Family Medicine | Admitting: Family Medicine

## 2023-08-16 DIAGNOSIS — J3089 Other allergic rhinitis: Secondary | ICD-10-CM | POA: Diagnosis not present

## 2023-08-16 DIAGNOSIS — J0100 Acute maxillary sinusitis, unspecified: Secondary | ICD-10-CM

## 2023-08-16 MED ORDER — AMOXICILLIN-POT CLAVULANATE 875-125 MG PO TABS: 1.0000 | ORAL_TABLET | Freq: Two times a day (BID) | ORAL | 0 refills | Status: DC

## 2023-08-16 NOTE — Telephone Encounter (Signed)
 LVM to call and schedule appt

## 2023-08-16 NOTE — Discharge Instructions (Signed)
 In addition to the prescribed medication, continue Zyrtec and Flonase regimen daily, saline sinus rinses, over-the-counter decongestants such as Coricidin HBP, plain Mucinex.  Follow-up for significantly worsening symptoms.

## 2023-08-16 NOTE — ED Triage Notes (Addendum)
 Pt reports sinus congestion, headache on the left side tenderness on the right side pressure around the eyes, sinus drainage, nasal congestion x 1 mo. Pt has tried OTC meds and found no relief.

## 2023-08-16 NOTE — ED Provider Notes (Signed)
 [facility]    CSN: [id number] Arrival date & time: 08/16/23  9046      History   Chief Complaint No chief complaint on file.   HPI Janet Gardner is a 68 y.o. female.   Patient presenting today with about a month of intermittent and now worsening nasal congestion, facial pain and pressure, headache, pressure behind eyes, sinus drainage, cough.  Denies chest pain, shortness of breath, abdominal pain, vomiting, diarrhea.  So far trying Sudafed, Flonase, Zyrtec with minimal relief.    Past Medical History:  Diagnosis Date   B12 deficiency    monthly injection   Fibromyalgia    GERD (gastroesophageal reflux disease)    Hyperlipidemia    Hypothyroidism    Osteopenia 06/11/2020    Patient Active Problem List   Diagnosis Date Noted   Muscle strain of right gluteal region 06/07/2023   Acute non-recurrent frontal sinusitis 03/30/2023   Viral illness 02/08/2023   Impacted cerumen of right ear 02/08/2023   Need for influenza vaccination 11/23/2022   Mixed hyperlipidemia 05/19/2022   Primary hypertension 06/11/2020   Chronic cystitis with hematuria 09/14/2019   Urge incontinence of urine 06/22/2019   PVC (premature ventricular contraction) 05/10/2019   Fibromyalgia muscle pain 09/02/2018   Vitamin D  deficiency 09/02/2018   Vitamin B12 deficiency 09/02/2018   Osteopenia determined by x-ray 09/02/2018   H/O: hysterectomy 09/02/2018   History of esophageal dilatation 09/02/2018   History of cholecystectomy 09/02/2018   Acquired hypothyroidism 06/14/2013   GERD 07/11/2009    Past Surgical History:  Procedure Laterality Date   ABDOMINAL HYSTERECTOMY     CHOLECYSTECTOMY     colonoscopy  2007   Dr. Dann, incomplete due to poor prep. scope passed to transverse colon. ACBE normal.   ESOPHAGOGASTRODUODENOSCOPY  07/22/2009   MFM:[id number] apperaring esophagus s/p 56 dilator/small HH/large ulcerated gastric polyp in the prepyloric antral area. Inflammatory fibroid polyp.    ESOPHAGOGASTRODUODENOSCOPY (EGD) WITH ESOPHAGEAL DILATION N/A 11/24/2012   Procedure: ESOPHAGOGASTRODUODENOSCOPY (EGD) WITH ESOPHAGEAL DILATION;  Surgeon: Lamar CHRISTELLA Hollingshead, MD;  Location: [facility];  Service: Endoscopy;  Laterality: N/A;  9:30AM   FOOT SURGERY     Right   KIDNEY SURGERY      OB History   No obstetric history on file.      Home Medications    Prior to Admission medications   Medication Sig Start Date End Date Taking? Authorizing Provider  amoxicillin -clavulanate (AUGMENTIN ) 875-125 MG tablet Take 1 tablet by mouth every 12 (twelve) hours. 08/16/23  Yes Stuart Vernell Norris, PA-C  albuterol  (VENTOLIN  HFA) 108 [phone] Base) MCG/ACT inhaler Inhale 2 puffs into the lungs every 6 (six) hours as needed for wheezing or shortness of breath. 04/05/23   Tobie Suzzane POUR, MD  amLODipine  (NORVASC ) 5 MG tablet TAKE 1 TABLET EVERY DAY 07/28/23   Bevely Doffing, FNP  cyclobenzaprine  (FLEXERIL ) 5 MG tablet Take 1 tablet (5 mg total) by mouth 3 (three) times daily as needed for muscle spasms. 07/20/23   Bevely Doffing, FNP  DULoxetine  (CYMBALTA ) 30 MG capsule Take 1 capsule (30 mg total) by mouth daily. 03/15/23   Melvenia Manus BRAVO, MD  folic acid -vitamin b complex-vitamin c-selenium-zinc (DIALYVITE) 3 MG TABS tablet Take 1 tablet by mouth daily.    [provider]  levothyroxine  (SYNTHROID ) 112 MCG tablet Take 1 tablet (112 mcg total) by mouth daily before breakfast. 06/07/23   Melvenia Manus BRAVO, MD  meclizine  (ANTIVERT ) 25 MG tablet Take 1 tablet (25 mg total)  by mouth 3 (three) times daily as needed for dizziness. 06/12/23   Daralene Bruckner D, PA-C  olmesartan  (BENICAR ) 20 MG tablet TAKE 1 TABLET EVERY DAY 03/15/23   Melvenia Manus BRAVO, MD  phenazopyridine  (PYRIDIUM ) 100 MG tablet Take 1 tablet (100 mg total) by mouth 3 (three) times daily as needed for pain. 04/13/23   Stuart Vernell Norris, PA-C  promethazine -dextromethorphan (PROMETHAZINE -DM) 6.25-15 MG/5ML syrup Take 5 mLs by mouth 4  (four) times daily as needed. 04/05/23   Tobie Suzzane POUR, MD  vitamin B-12 (CYANOCOBALAMIN ) 500 MCG tablet Take 500 mcg by mouth daily.    [provider]    Family History Family History  Problem Relation Age of Onset   Breast cancer Mother    Heart disease Father    Colon cancer Neg Hx     Social History Social History   Tobacco Use   Smoking status: Never   Smokeless tobacco: Never  Vaping Use   Vaping status: Never Used  Substance Use Topics   Alcohol use: No   Drug use: No     Allergies   Azithromycin, Levaquin [levofloxacin], and Sulfa antibiotics   Review of Systems Review of Systems Per HPI  Physical Exam Triage Vital Signs ED Triage Vitals  Encounter Vitals Group     BP 08/16/23 1004 (!) 145/93     Girls Systolic BP Percentile --      Girls Diastolic BP Percentile --      Boys Systolic BP Percentile --      Boys Diastolic BP Percentile --      Pulse Rate 08/16/23 1004 90     Resp 08/16/23 1004 20     Temp 08/16/23 1004 97.9 F (36.6 C)     Temp Source 08/16/23 1004 Oral     SpO2 08/16/23 1004 97 %     Weight --      Height --      Head Circumference --      Peak Flow --      Pain Score 08/16/23 1006 0     Pain Loc --      Pain Education --      Exclude from Growth Chart --    No data found.  Updated Vital Signs BP (!) 145/93 (BP Location: Right Arm)   Pulse 90   Temp 97.9 F (36.6 C) (Oral)   Resp 20   SpO2 97%   Visual Acuity Right Eye Distance:   Left Eye Distance:   Bilateral Distance:    Right Eye Near:   Left Eye Near:    Bilateral Near:     Physical Exam Vitals and nursing note reviewed.  Constitutional:      Appearance: Normal appearance.  HENT:     Head: Atraumatic.     Right Ear: Tympanic membrane and external ear normal.     Left Ear: Tympanic membrane and external ear normal.     Nose: Congestion and rhinorrhea present.     Mouth/Throat:     Mouth: Mucous membranes are moist.     Pharynx: Posterior  oropharyngeal erythema present.  Eyes:     Extraocular Movements: Extraocular movements intact.     Conjunctiva/sclera: Conjunctivae normal.  Cardiovascular:     Rate and Rhythm: Normal rate and regular rhythm.     Heart sounds: Normal heart sounds.  Pulmonary:     Effort: Pulmonary effort is normal.     Breath sounds: Normal breath sounds. No wheezing or rales.  Musculoskeletal:        General: Normal range of motion.     Cervical back: Normal range of motion and neck supple.  Skin:    General: Skin is warm and dry.  Neurological:     Mental Status: She is alert and oriented to person, place, and time.  Psychiatric:        Mood and Affect: Mood normal.        Thought Content: Thought content normal.      UC Treatments / Results  Labs (all labs ordered are listed, but only abnormal results are displayed) Labs Reviewed - No data to display  EKG   Radiology No results found.  Procedures Procedures (including critical care time)  Medications Ordered in UC Medications - No data to display  Initial Impression / Assessment and Plan / UC Course  I have reviewed the triage vital signs and the nursing notes.  Pertinent labs & imaging results that were available during my care of the patient were reviewed by me and considered in my medical decision making (see chart for details).     Suspect uncontrolled seasonal allergies causing a sinus infection.  Continue daily allergy regimen with Zyrtec, Flonase and discussed saline sinus rinses, supportive over-the-counter medications and home care.  Return for worsening symptoms.  Final Clinical Impressions(s) / UC Diagnoses   Final diagnoses:  Acute non-recurrent maxillary sinusitis  Seasonal allergic rhinitis due to other allergic trigger     Discharge Instructions      In addition to the prescribed medication, continue Zyrtec and Flonase regimen daily, saline sinus rinses, over-the-counter decongestants such as Coricidin  HBP, plain Mucinex.  Follow-up for significantly worsening symptoms.     ED Prescriptions     Medication Sig Dispense Auth. Provider   amoxicillin -clavulanate (AUGMENTIN ) 875-125 MG tablet Take 1 tablet by mouth every 12 (twelve) hours. 14 tablet Stuart Vernell Norris, [place]      PDMP not reviewed this encounter.   Stuart Vernell Norris, [place] 08/16/23 1425

## 2023-08-16 NOTE — Telephone Encounter (Signed)
 FYI Only or Action Required?: FYI only for provider.  Patient was last seen in primary care on 06/07/2023 by Melvenia Manus BRAVO, MD. Called Nurse Triage reporting No chief complaint on file.. Symptoms began several weeks ago. Interventions attempted: OTC medications: Pseudofed. Symptoms are: gradually worsening.  Triage Disposition: See HCP Within 4 Hours (Or PCP Triage)  Patient/caregiver understands and will follow disposition?: Yes  Copied from CRM [id number]. Topic: Clinical - Red Word Triage >> Aug 16, 2023  8:17 AM Gustabo D wrote: Real bad sinus infection and left side eye really swollen and her face hurts really bad. Her ears and nose are stopped up. She says there is a knot on the left side of eye. For about 2 weeks says she has been taking the otc stuff.  Reason for Disposition  [1] Redness or swelling on the cheek, forehead or around the eye AND [2] no fever  Answer Assessment - Initial Assessment Questions 1. LOCATION: Where does it hurt?      All throughout the head, Left Eye is the worse  2. ONSET: When did the sinus pain start?  (e.g., hours, days)      2-3 Weeks Ago  3. SEVERITY: How bad is the pain?   (Scale 1-10; mild, moderate or severe)   - MILD (1-3): doesn't interfere with normal activities    - MODERATE (4-7): interferes with normal activities (e.g., work or school) or awakens from sleep   - SEVERE (8-10): excruciating pain and patient unable to do any normal activities        Moderate to Severe  4. RECURRENT SYMPTOM: Have you ever had sinus problems before? If Yes, ask: When was the last time? and What happened that time?      Yes  5. NASAL CONGESTION: Is the nose blocked? If Yes, ask: Can you open it or must you breathe through your mouth?     Yes  6. NASAL DISCHARGE: Do you have discharge from your nose? If so ask, What color?     Yes  7. FEVER: Do you have a fever? If Yes, ask: What is it, how was it measured, and when did it start?       No  8. OTHER SYMPTOMS: Do you have any other symptoms? (e.g., sore throat, cough, earache, difficulty breathing)     Knot over left eye, Swelling Head, Ear Congestion  9. PREGNANCY: Is there any chance you are pregnant? When was your last menstrual period?     No and No  Protocols used: Sinus Pain or Congestion-A-AH

## 2023-08-30 ENCOUNTER — Ambulatory Visit: Admitting: Neurology

## 2023-09-13 ENCOUNTER — Ambulatory Visit

## 2023-09-13 VITALS — BP 141/83 | HR 83 | Ht 69.0 in | Wt 210.0 lb

## 2023-09-13 DIAGNOSIS — E559 Vitamin D deficiency, unspecified: Secondary | ICD-10-CM | POA: Diagnosis not present

## 2023-09-13 DIAGNOSIS — E039 Hypothyroidism, unspecified: Secondary | ICD-10-CM | POA: Diagnosis not present

## 2023-09-13 DIAGNOSIS — R7303 Prediabetes: Secondary | ICD-10-CM

## 2023-09-13 DIAGNOSIS — E538 Deficiency of other specified B group vitamins: Secondary | ICD-10-CM | POA: Diagnosis not present

## 2023-09-13 DIAGNOSIS — H6121 Impacted cerumen, right ear: Secondary | ICD-10-CM | POA: Diagnosis not present

## 2023-09-13 NOTE — Assessment & Plan Note (Signed)
 Recheck levels today

## 2023-09-13 NOTE — Assessment & Plan Note (Addendum)
 Recheck levels today

## 2023-09-13 NOTE — Progress Notes (Addendum)
 Established Patient Office Visit  Subjective   Patient ID: Janet Gardner, female    DOB: December 05, 1955  Age: 68 y.o. MRN: [id number]  Chief Complaint  Patient presents with   Medical Management of Chronic Issues    Follow up     HPI  Patient Active Problem List   Diagnosis Date Noted   Prediabetes 09/21/2023   Muscle strain of right gluteal region 06/07/2023   Acute non-recurrent frontal sinusitis 03/30/2023   Viral illness 02/08/2023   Impacted cerumen of right ear 02/08/2023   Need for influenza vaccination 11/23/2022   Mixed hyperlipidemia 05/19/2022   Primary hypertension 06/11/2020   Chronic cystitis with hematuria 09/14/2019   Urge incontinence of urine 06/22/2019   PVC (premature ventricular contraction) 05/10/2019   Fibromyalgia muscle pain 09/02/2018   Vitamin D  deficiency 09/02/2018   Vitamin B12 deficiency 09/02/2018   Osteopenia determined by x-ray 09/02/2018   H/O: hysterectomy 09/02/2018   History of esophageal dilatation 09/02/2018   History of cholecystectomy 09/02/2018   Acquired hypothyroidism 06/14/2013   GERD 07/11/2009    ROS    Objective:     BP (!) 141/83   Pulse 83   Ht 5' 9 (1.753 m)   Wt 210 lb 0.6 oz (95.3 kg)   SpO2 96%   BMI 31.02 kg/m  BP Readings from Last 3 Encounters:  09/13/23 (!) 141/83  08/16/23 (!) 145/93  06/12/23 (!) 181/60   Wt Readings from Last 3 Encounters:  09/13/23 210 lb 0.6 oz (95.3 kg)  06/12/23 201 lb (91.2 kg)  06/07/23 214 lb 12.8 oz (97.4 kg)     Physical Exam Vitals and nursing note reviewed.  Constitutional:      Appearance: Normal appearance. She is obese.  HENT:     Head: Normocephalic.     Right Ear: Hearing, tympanic membrane, ear canal and external ear normal.     Left Ear: Hearing, tympanic membrane, ear canal and external ear normal.  Eyes:     Extraocular Movements: Extraocular movements intact.     Pupils: Pupils are equal, round, and reactive to light.  Cardiovascular:     Rate and  Rhythm: Normal rate and regular rhythm.  Pulmonary:     Effort: Pulmonary effort is normal.     Breath sounds: Normal breath sounds.  Musculoskeletal:     Cervical back: Normal range of motion and neck supple.  Neurological:     Mental Status: She is alert and oriented to person, place, and time.  Psychiatric:        Mood and Affect: Mood normal.        Thought Content: Thought content normal.     Last CBC Lab Results  Component Value Date   WBC 6.3 06/12/2023   HGB 12.9 06/12/2023   HCT 39.5 06/12/2023   MCV 84.2 06/12/2023   MCH 27.5 06/12/2023   RDW 14.4 06/12/2023   PLT 231 06/12/2023   Last metabolic panel Lab Results  Component Value Date   GLUCOSE 112 (H) 06/12/2023   NA 136 06/12/2023   K 3.6 06/12/2023   CL 106 06/12/2023   CO2 21 (L) 06/12/2023   BUN 19 06/12/2023   CREATININE 0.91 06/12/2023   GFRNONAA >60 06/12/2023   CALCIUM  8.6 (L) 06/12/2023   PROT 7.0 06/12/2023   ALBUMIN 3.5 06/12/2023   LABGLOB 3.1 06/07/2023   AGRATIO 1.4 05/19/2022   BILITOT 0.4 06/12/2023   ALKPHOS 69 06/12/2023   AST 19 06/12/2023   ALT  16 06/12/2023   ANIONGAP 9 06/12/2023   Last lipids Lab Results  Component Value Date   CHOL 193 06/07/2023   HDL 42 06/07/2023   LDLCALC 125 (H) 06/07/2023   TRIG 147 06/07/2023   CHOLHDL 4.6 (H) 06/07/2023   Last hemoglobin A1c Lab Results  Component Value Date   HGBA1C 5.9 (H) 06/07/2023      The 10-year ASCVD risk score (Arnett DK, et al., 2019) is: 15.7%    Assessment & Plan:   Problem List Items Addressed This Visit       Endocrine   Acquired hypothyroidism - Primary   Currently prescribed levothyroxine  112 mcg daily.  Repeat thyroid  studies ordered today.      Relevant Orders   TSH + free T4 (Completed)     Nervous and Auditory   Impacted cerumen of right ear   Ear irrigation performed.  The patient tolerated procedure well and reports improvement of her symptoms.        Other   Vitamin D  deficiency    Recheck levels today.      Relevant Orders   Vitamin D  (25 hydroxy) (Completed)   Vitamin B12 deficiency   Recheck levels today.       Relevant Orders   B12 and Folate Panel (Completed)   Prediabetes   Relevant Orders   Bayer DCA Hb A1c Waived    Return in about 4 months (around 01/13/2024) for chronic follow-up with PCP.    Leita Longs, FNP

## 2023-09-13 NOTE — Assessment & Plan Note (Signed)
 Currently prescribed levothyroxine  112 mcg daily.  Repeat thyroid  studies ordered today.

## 2023-09-14 LAB — B12 AND FOLATE PANEL
Folate: 6.3 ng/mL (ref >3.0)
Vitamin B-12: 406 pg/mL (ref 232–1245)

## 2023-09-14 LAB — VITAMIN D 25 HYDROXY (VIT D DEFICIENCY, FRACTURES): Vit D, 25-Hydroxy: 24.6 ng/mL — ABNORMAL LOW (ref 30.0–100.0)

## 2023-09-14 LAB — TSH+FREE T4
Free T4: 1.31 ng/dL (ref 0.82–1.77)
TSH: 15.3 u[IU]/mL — ABNORMAL HIGH (ref 0.450–4.500)

## 2023-09-16 NOTE — Telephone Encounter (Signed)
 She would need to wait for Leita for medication changes

## 2023-09-21 ENCOUNTER — Ambulatory Visit: Payer: Self-pay

## 2023-09-21 ENCOUNTER — Other Ambulatory Visit: Payer: Self-pay

## 2023-09-21 DIAGNOSIS — E039 Hypothyroidism, unspecified: Secondary | ICD-10-CM

## 2023-09-21 DIAGNOSIS — R7303 Prediabetes: Secondary | ICD-10-CM | POA: Insufficient documentation

## 2023-09-21 MED ORDER — LEVOTHYROXINE SODIUM 125 MCG PO TABS: 125.0000 ug | ORAL_TABLET | Freq: Every day | ORAL | 1 refills | Status: DC

## 2023-09-23 NOTE — Telephone Encounter (Signed)
 Dose increased to 125 mcg.

## 2023-09-27 DIAGNOSIS — H524 Presbyopia: Secondary | ICD-10-CM | POA: Diagnosis not present

## 2023-09-27 DIAGNOSIS — H5203 Hypermetropia, bilateral: Secondary | ICD-10-CM | POA: Diagnosis not present

## 2023-09-27 DIAGNOSIS — H52223 Regular astigmatism, bilateral: Secondary | ICD-10-CM | POA: Diagnosis not present

## 2023-10-05 NOTE — Assessment & Plan Note (Signed)
 Ear irrigation performed.  The patient tolerated procedure well and reports improvement of her symptoms.

## 2023-10-19 ENCOUNTER — Ambulatory Visit: Admitting: Neurology

## 2023-12-09 ENCOUNTER — Ambulatory Visit (INDEPENDENT_AMBULATORY_CARE_PROVIDER_SITE_OTHER)

## 2023-12-09 VITALS — Ht 69.0 in | Wt 210.0 lb

## 2023-12-09 DIAGNOSIS — Z1211 Encounter for screening for malignant neoplasm of colon: Secondary | ICD-10-CM

## 2023-12-09 DIAGNOSIS — Z0000 Encounter for general adult medical examination without abnormal findings: Secondary | ICD-10-CM

## 2023-12-09 DIAGNOSIS — Z780 Asymptomatic menopausal state: Secondary | ICD-10-CM

## 2023-12-09 DIAGNOSIS — Z1231 Encounter for screening mammogram for malignant neoplasm of breast: Secondary | ICD-10-CM

## 2023-12-09 NOTE — Progress Notes (Signed)
 Subjective:   Janet Gardner is a 68 y.o. who presents for a Medicare Wellness preventive visit.  As a reminder, Annual Wellness Visits don't include a physical exam, and some assessments may be limited, especially if this visit is performed virtually. We may recommend an in-person follow-up visit with your provider if needed.  Visit Complete: Virtual I connected with  Janet Gardner on 12/09/23 by a video and audio enabled telemedicine application and verified that I am speaking with the correct person using two identifiers.  Patient Location: Home  Provider Location: Home Office  I discussed the limitations of evaluation and management by telemedicine. The patient expressed understanding and agreed to proceed.  Vital Signs: Because this visit was a virtual/telehealth visit, some criteria may be missing or patient reported. Any vitals not documented were not able to be obtained and vitals that have been documented are patient reported.  Persons Participating in Visit: Patient.  AWV Questionnaire: No: Patient Medicare AWV questionnaire was not completed prior to this visit.  Cardiac Risk Factors include: advanced age (>31men, >10 women);dyslipidemia;hypertension;obesity (BMI >30kg/m2);sedentary lifestyle     Objective:    Today's Vitals   12/09/23 0803  Weight: 210 lb (95.3 kg)  Height: 5' 9 (1.753 m)  PainSc: 8   PainLoc: Neck   Body mass index is 31.01 kg/m.     12/09/2023    8:02 AM 06/12/2023    6:53 PM 06/09/2022    9:45 AM 06/05/2021    9:55 AM 06/04/2020   10:03 AM 06/02/2019    9:35 AM 11/24/2012    8:22 AM  Advanced Directives  Does Patient Have a Medical Advance Directive? No No No No No No Patient does not have advance directive;Patient would not like information   Would patient like information on creating a medical advance directive? Yes (MAU/Ambulatory/Procedural Areas - Information given) No - Patient declined No - Patient declined No - Patient declined Yes  (MAU/Ambulatory/Procedural Areas - Information given) No - Patient declined      Data saved with a previous flowsheet row definition    Current Medications (verified) Outpatient Encounter Medications as of 12/09/2023  Medication Sig   albuterol  (VENTOLIN  HFA) 108 (90 Base) MCG/ACT inhaler Inhale 2 puffs into the lungs every 6 (six) hours as needed for wheezing or shortness of breath.   amLODipine  (NORVASC ) 5 MG tablet TAKE 1 TABLET EVERY DAY   cyclobenzaprine  (FLEXERIL ) 5 MG tablet Take 1 tablet (5 mg total) by mouth 3 (three) times daily as needed for muscle spasms.   DULoxetine  (CYMBALTA ) 30 MG capsule Take 1 capsule (30 mg total) by mouth daily.   folic acid -vitamin b complex-vitamin c-selenium-zinc (DIALYVITE) 3 MG TABS tablet Take 1 tablet by mouth daily.   levothyroxine  (SYNTHROID ) 125 MCG tablet Take 1 tablet (125 mcg total) by mouth daily before breakfast.   meclizine  (ANTIVERT ) 25 MG tablet Take 1 tablet (25 mg total) by mouth 3 (three) times daily as needed for dizziness.   olmesartan  (BENICAR ) 20 MG tablet TAKE 1 TABLET EVERY DAY   vitamin B-12 (CYANOCOBALAMIN ) 500 MCG tablet Take 500 mcg by mouth daily.   No facility-administered encounter medications on file as of 12/09/2023.    Allergies (verified) Azithromycin, Levaquin [levofloxacin], and Sulfa antibiotics   History: Past Medical History:  Diagnosis Date   B12 deficiency    monthly injection   Fibromyalgia    GERD (gastroesophageal reflux disease)    Hyperlipidemia    Hypothyroidism    Osteopenia 06/11/2020  Past Surgical History:  Procedure Laterality Date   ABDOMINAL HYSTERECTOMY     CHOLECYSTECTOMY     colonoscopy  2007   Dr. Dann, incomplete due to poor prep. scope passed to transverse colon. ACBE normal.   ESOPHAGOGASTRODUODENOSCOPY  07/22/2009   MFM:wnmfjo apperaring esophagus s/p 56 dilator/small HH/large ulcerated gastric polyp in the prepyloric antral area. Inflammatory fibroid polyp.    ESOPHAGOGASTRODUODENOSCOPY (EGD) WITH ESOPHAGEAL DILATION N/A 11/24/2012   Procedure: ESOPHAGOGASTRODUODENOSCOPY (EGD) WITH ESOPHAGEAL DILATION;  Surgeon: Lamar CHRISTELLA Hollingshead, MD;  Location: [facility];  Service: Endoscopy;  Laterality: N/A;  9:30AM   FOOT SURGERY     Right   KIDNEY SURGERY     Family History  Problem Relation Age of Onset   Breast cancer Mother    Heart disease Father    Colon cancer Neg Hx    Social History   Socioeconomic History   Marital status: Married    Spouse name: Not on file   Number of children: 1   Years of education: Not on file   Highest education level: Not on file  Occupational History   Occupation: retired    Comment: retired  Tobacco Use   Smoking status: Never   Smokeless tobacco: Never  Vaping Use   Vaping status: Never Used  Substance and Sexual Activity   Alcohol use: No   Drug use: No   Sexual activity: Not on file  Other Topics Concern   Not on file  Social History Narrative   Lives with her husband and her 90 year old granddaughter. Her daughter lives in [place]    Social Drivers of Health   Financial Resource Strain: Low Risk  (12/09/2023)   Overall Financial Resource Strain (CARDIA)    Difficulty of Paying Living Expenses: Not hard at all  Food Insecurity: No Food Insecurity (12/09/2023)   Hunger Vital Sign    Worried About Running Out of Food in the Last Year: Never true    Ran Out of Food in the Last Year: Never true  Transportation Needs: No Transportation Needs (12/09/2023)   PRAPARE - Administrator, Civil Service (Medical): No    Lack of Transportation (Non-Medical): No  Physical Activity: Inactive (12/09/2023)   Exercise Vital Sign    Days of Exercise per Week: 0 days    Minutes of Exercise per Session: 0 min  Stress: No Stress Concern Present (12/09/2023)   [employer] of Occupational Health - Occupational Stress Questionnaire    Feeling of Stress: Not at all  Social Connections: Moderately  Integrated (12/09/2023)   Social Connection and Isolation Panel    Frequency of Communication with Friends and Family: More than three times a week    Frequency of Social Gatherings with Friends and Family: Twice a week    Attends Religious Services: More than 4 times per year    Active Member of [employer] or Organizations: No    Attends Engineer, Structural: Never    Marital Status: Married    Tobacco Counseling Counseling given: Yes    Clinical Intake:  Pre-visit preparation completed: Yes  Pain : 0-10 Pain Score: 8  Pain Type: Chronic pain Pain Location: Neck Pain Orientation: Posterior, Left Pain Radiating Towards: bilateral shoulders Pain Descriptors / Indicators: Constant Pain Onset: More than a month ago Pain Frequency: Constant     BMI - recorded: 31.01 Nutritional Risks: None Diabetes: No  Lab Results  Component Value Date   HGBA1C 5.9 (H) 06/07/2023  How often do you need to have someone help you when you read instructions, pamphlets, or other written materials from your doctor or pharmacy?: 1 - Never  Interpreter Needed?: No  Information entered by :: Rees Matura W CMA (AAMA)   Activities of Daily Living     12/09/2023    8:12 AM  In your present state of health, do you have any difficulty performing the following activities:  Hearing? 0  Vision? 0  Difficulty concentrating or making decisions? 0  Walking or climbing stairs? 1  Dressing or bathing? 0  Doing errands, shopping? 0  Preparing Food and eating ? N  Using the Toilet? N  In the past six months, have you accidently leaked urine? Y  Do you have problems with loss of bowel control? N  Managing your Medications? N  Managing your Finances? N  Housekeeping or managing your Housekeeping? N    Patient Care Team: Bevely Doffing, FNP as PCP - General (Family Medicine) Shaaron Lamar HERO, MD as Consulting Physician (Gastroenterology) Vicci Mcardle, OD (Optometry)  I have updated your  Care Teams any recent Medical Services you may have received from other providers in the past year.     Assessment:   This is a routine wellness examination for Janet Gardner.  Hearing/Vision screen Hearing Screening - Comments:: Patient denies any hearing difficulties.   Vision Screening - Comments:: Wears rx glasses - up to date with routine eye exams with  Dr. Mcardle Vicci in [place]   Goals Addressed               This Visit's Progress     I want to have less pain (pt-stated)          Depression Screen     12/09/2023    8:08 AM 09/13/2023    8:29 AM 06/07/2023   10:16 AM 03/30/2023    9:02 AM 02/08/2023   11:04 AM 12/29/2022    8:59 AM 11/23/2022    9:40 AM  PHQ 2/9 Scores  PHQ - 2 Score 0 0 0 0 0 0 0  PHQ- 9 Score 0 3 0 0 0 1 2     Fall Risk     12/09/2023    8:12 AM 09/13/2023    8:29 AM 06/07/2023   10:16 AM 03/30/2023    9:02 AM 02/08/2023   11:04 AM  Fall Risk   Falls in the past year? 1 0 0 0 0  Number falls in past yr: 0  0 0 0  Injury with Fall? 0  0 0 0  Risk for fall due to : History of fall(s) No Fall Risks No Fall Risks No Fall Risks No Fall Risks  Follow up Falls evaluation completed;Education provided;Falls prevention discussed Falls evaluation completed Falls evaluation completed Falls evaluation completed Falls evaluation completed    MEDICARE RISK AT HOME:  Medicare Risk at Home Any stairs in or around the home?: Yes If so, are there any without handrails?: No Home free of loose throw rugs in walkways, pet beds, electrical cords, etc?: Yes Adequate lighting in your home to reduce risk of falls?: Yes Life alert?: No Use of a cane, walker or w/c?: No Grab bars in the bathroom?: No Shower chair or bench in shower?: No Elevated toilet seat or a handicapped toilet?: No  TIMED UP AND GO:  Was the test performed?  No  Cognitive Function: 6CIT completed        12/09/2023    8:13 AM 06/09/2022  9:46 AM 06/05/2021    9:55 AM 06/02/2019    9:07  AM  6CIT Screen  What Year? 0 points 0 points 0 points 0 points  What month? 0 points 0 points 0 points 0 points  What time? 0 points 0 points 0 points 0 points  Count back from 20 0 points 0 points 0 points 0 points  Months in reverse 0 points 0 points 0 points 0 points  Repeat phrase 0 points 0 points 0 points 0 points  Total Score 0 points 0 points 0 points 0 points    Immunizations Immunization History  Administered Date(s) Administered   Fluad Quad(high Dose 65+) 10/13/2018, 10/27/2019, 11/11/2020   Fluad Trivalent(High Dose 65+) 11/23/2022   INFLUENZA, HIGH DOSE SEASONAL PF 12/02/2023   Influenza,inj,Quad PF,6+ Mos 11/04/2016, 11/09/2017   Influenza-Unspecified 11/23/2013, 11/21/2014, 11/20/2015, 11/04/2016, 11/09/2017, 10/13/2018, 10/27/2019   Moderna Sars-Covid-2 Vaccination 04/06/2019, 05/05/2019, 03/05/2020   Pneumococcal Conjugate-13 09/02/2018   Tdap 07/15/2015    Screening Tests Health Maintenance  Topic Date Due   Zoster Vaccines- Shingrix (1 of 2) Never done   Pneumococcal Vaccine: 50+ Years (2 of 2 - PCV20 or PCV21) 09/02/2019   DEXA SCAN  06/12/2022   Fecal DNA (Cologuard)  06/26/2023   Mammogram  09/21/2023   COVID-19 Vaccine (4 - 2025-26 season) 10/11/2023   Medicare Annual Wellness (AWV)  12/08/2024   DTaP/Tdap/Td (2 - Td or Tdap) 07/14/2025   Influenza Vaccine  Completed   Hepatitis C Screening  Completed   Meningococcal B Vaccine  Aged Out   Colonoscopy  Discontinued    Health Maintenance Health Maintenance Due  Topic Date Due   Zoster Vaccines- Shingrix (1 of 2) Never done   Pneumococcal Vaccine: 50+ Years (2 of 2 - PCV20 or PCV21) 09/02/2019   DEXA SCAN  06/12/2022   Fecal DNA (Cologuard)  06/26/2023   Mammogram  09/21/2023   COVID-19 Vaccine (4 - 2025-26 season) 10/11/2023   Health Maintenance Items Addressed: Mammogram ordered, DEXA ordered, Cologuard Ordered  Additional Screening:  Vision Screening: Recommended annual ophthalmology  exams for early detection of glaucoma and other disorders of the eye. Would you like a referral to an eye doctor? No    Dental Screening: Recommended annual dental exams for proper oral hygiene  Community Resource Referral / Chronic Care Management: CRR required this visit?  No   CCM required this visit?  No   Plan:    I have personally reviewed and noted the following in the patient's chart:   Medical and social history Use of alcohol, tobacco or illicit drugs  Current medications and supplements including opioid prescriptions. Patient is not currently taking opioid prescriptions. Functional ability and status Nutritional status Physical activity Advanced directives List of other physicians Hospitalizations, surgeries, and ER visits in previous 12 months Vitals Screenings to include cognitive, depression, and falls Referrals and appointments  In addition, I have reviewed and discussed with patient certain preventive protocols, quality metrics, and best practice recommendations. A written personalized care plan for preventive services as well as general preventive health recommendations were provided to patient.   Janet Gardner, CMA   12/09/2023   After Visit Summary: (MyChart) Due to this being a telephonic visit, the after visit summary with patients personalized plan was offered to patient via MyChart   Notes: Nothing significant to report at this time.

## 2023-12-09 NOTE — Patient Instructions (Signed)
 [name],  Thank you for taking the time for your Medicare Wellness Visit. I appreciate your continued commitment to your health goals. Please review the care plan we discussed, and feel free to reach out if I can assist you further.  Medicare recommends these wellness visits once per year to help you and your care team stay ahead of potential health issues. These visits are designed to focus on prevention, allowing your provider to concentrate on managing your acute and chronic conditions during your regular appointments.  Please note that Annual Wellness Visits do not include a physical exam. Some assessments may be limited, especially if the visit was conducted virtually. If needed, we may recommend a separate in-person follow-up with your provider.  Ongoing Care  Seeing your primary care provider every 3 to 6 months helps us  monitor your health and provide consistent, personalized care.   NEXT MEDICARE WELLNESS VISIT IN OFFICE: December 12, 2024 AT 8:00 AM  Referrals   Mammogram/Bone Density Screening: Call [facility] @ Phone: [phone]   Cologuard (at home testing) Cologuard was ordered today.If you haven't received your kit in the next 2 weeks, please call: [employer] [phone].   Recommended Screenings:  Health Maintenance  Topic Date Due   Zoster (Shingles) Vaccine (1 of 2) Never done   Pneumococcal Vaccine for age over 51 (2 of 2 - PCV20 or PCV21) 09/02/2019   DEXA scan (bone density measurement)  06/12/2022   Cologuard (Stool DNA test)  06/26/2023   Breast Cancer Screening  09/21/2023   COVID-19 Vaccine (4 - 2025-26 season) 10/11/2023   Medicare Annual Wellness Visit  12/08/2024   DTaP/Tdap/Td vaccine (2 - Td or Tdap) 07/14/2025   Flu Shot  Completed   Hepatitis C Screening  Completed   Meningitis B Vaccine  Aged Out   Colon Cancer Screening  Discontinued       12/09/2023    8:02 AM  Advanced Directives  Does Patient Have a Medical  Advance Directive? No  Would patient like information on creating a medical advance directive? Yes (MAU/Ambulatory/Procedural Areas - Information given)    Advance Care Planning is important because it: Ensures you receive medical care that aligns with your values, goals, and preferences. Provides guidance to your family and loved ones, reducing the emotional burden of decision-making during critical moments.  Vision: Annual vision screenings are recommended for early detection of glaucoma, cataracts, and diabetic retinopathy. These exams can also reveal signs of chronic conditions such as diabetes and high blood pressure.  Dental: Annual dental screenings help detect early signs of oral cancer, gum disease, and other conditions linked to overall health, including heart disease and diabetes.  Please see the attached documents for additional preventive care recommendations.

## 2023-12-10 ENCOUNTER — Ambulatory Visit: Payer: Self-pay

## 2023-12-10 NOTE — Telephone Encounter (Signed)
 FYI Only or Action Required?: FYI only for provider: appointment scheduled on 12/13/2023 at 10:20 AM .  Patient was last seen in primary care on 09/13/2023 by Bevely Doffing, FNP.  Called Nurse Triage reporting Neck Pain.  Symptoms began 2 weeks ago.  Interventions attempted: Rest, hydration, or home remedies.  Symptoms are: unchanged.  Triage Disposition: See PCP When Office is Open (Within 3 Days)  Patient/caregiver understands and will follow disposition?:   Copied from CRM #[id number]. Topic: Clinical - Red Word Triage >> Dec 10, 2023  9:24 AM Thersia BROCKS wrote: Red Word that prompted transfer to Nurse Triage: Patient called in stated her left side of her neck in a lot pain cant turn it    ----------------------------------------------------------------------- From previous Reason for Contact - Scheduling: Patient/patient representative is calling to schedule an appointment. Refer to attachments for appointment information. Reason for Disposition  [1] MODERATE neck pain (e.g., interferes with normal activities) AND [2] present > 3 days  Answer Assessment - Initial Assessment Questions 1. ONSET: When did the pain begin?      Started two weeks ago 2. LOCATION: Where does it hurt?      Left side of neck 3. PATTERN Does the pain come and go, or has it been constant since it started?      Comes and goes 4. SEVERITY: How bad is the pain?  (Scale 0-10; or none or slight stiffness, mild, moderate, severe)     7 to 8 out of 10 5. RADIATION: Does the pain go anywhere else, shoot into your arms?     In the shoulder area and neck 6. CORD SYMPTOMS: Any weakness or numbness of the arms or legs?     no 7. CAUSE: What do you think is causing the neck pain?     unsure 8. NECK OVERUSE: Any recent activities that involved turning or twisting the neck?     no 9. OTHER SYMPTOMS: Do you have any other symptoms? (e.g., headache, fever, chest pain, difficulty breathing, neck  swelling)     no  Protocols used: Neck Pain or Stiffness-A-AH

## 2023-12-13 ENCOUNTER — Ambulatory Visit (INDEPENDENT_AMBULATORY_CARE_PROVIDER_SITE_OTHER): Payer: Self-pay | Admitting: Internal Medicine

## 2023-12-13 ENCOUNTER — Encounter: Payer: Self-pay | Admitting: Internal Medicine

## 2023-12-13 VITALS — BP 134/84 | HR 85 | Ht 69.0 in | Wt 214.4 lb

## 2023-12-13 DIAGNOSIS — J0110 Acute frontal sinusitis, unspecified: Secondary | ICD-10-CM | POA: Diagnosis not present

## 2023-12-13 DIAGNOSIS — M797 Fibromyalgia: Secondary | ICD-10-CM

## 2023-12-13 MED ORDER — DULOXETINE HCL 30 MG PO CPEP: 30.0000 mg | ORAL_CAPSULE | Freq: Every day | ORAL | 1 refills | Status: AC

## 2023-12-13 MED ORDER — PREDNISONE 20 MG PO TABS: 40.0000 mg | ORAL_TABLET | Freq: Every day | ORAL | 0 refills | Status: DC

## 2023-12-13 MED ORDER — AMOXICILLIN-POT CLAVULANATE 875-125 MG PO TABS: 1.0000 | ORAL_TABLET | Freq: Two times a day (BID) | ORAL | 0 refills | Status: DC

## 2023-12-13 NOTE — Progress Notes (Unsigned)
 Acute Office Visit  Subjective:    Patient ID: Janet Gardner, female    DOB: [birth year], 68 y.o.   MRN: [id number]  Chief Complaint  Patient presents with   Neck Pain    Reports sx of left arm pain and neck pain ongoing for 1 week and a half.     HPI Patient is in today for complaint of recent worsening of neck pain for the last 1 week.  Neck pain is constant, dull, radiating to LUE and worse with neck movement.  He reports history of fibromyalgia and takes Cymbalta  30 mg QD.  Janet Gardner also takes Flexeril  as needed for muscle spasms.  Denies any recent injury.  Does not report any new onset numbness or tingling of the QD.  Janet Gardner also reports nasal congestion, sinus pressure related headache and postnasal drip for the last 1 week.  Denies fever, chills, dyspnea or wheezing.  Janet Gardner has tried taking OTC antihistaminic without much relief.  Past Medical History:  Diagnosis Date   B12 deficiency    monthly injection   Fibromyalgia    GERD (gastroesophageal reflux disease)    Hyperlipidemia    Hypothyroidism    Osteopenia 06/11/2020    Past Surgical History:  Procedure Laterality Date   ABDOMINAL HYSTERECTOMY     CHOLECYSTECTOMY     colonoscopy  2007   Dr. Dann, incomplete due to poor prep. scope passed to transverse colon. ACBE normal.   ESOPHAGOGASTRODUODENOSCOPY  07/22/2009   MFM:wnmfjo apperaring esophagus s/p 56 dilator/small HH/large ulcerated gastric polyp in the prepyloric antral area. Inflammatory fibroid polyp.   ESOPHAGOGASTRODUODENOSCOPY (EGD) WITH ESOPHAGEAL DILATION N/A 11/24/2012   Procedure: ESOPHAGOGASTRODUODENOSCOPY (EGD) WITH ESOPHAGEAL DILATION;  Surgeon: Lamar CHRISTELLA Hollingshead, MD;  Location: [facility];  Service: Endoscopy;  Laterality: N/A;  9:30AM   FOOT SURGERY     Right   KIDNEY SURGERY      Family History  Problem Relation Age of Onset   Breast cancer Mother    Heart disease Father    Colon cancer Neg Hx     Social History   Socioeconomic History   Marital  status: Married    Spouse name: Not on file   Number of children: 1   Years of education: Not on file   Highest education level: Not on file  Occupational History   Occupation: retired    Comment: retired  Tobacco Use   Smoking status: Never   Smokeless tobacco: Never  Vaping Use   Vaping status: Never Used  Substance and Sexual Activity   Alcohol use: No   Drug use: No   Sexual activity: Not on file  Other Topics Concern   Not on file  Social History Narrative   Lives with her husband and her 2 year old granddaughter. Her daughter lives in [place]    Social Drivers of Health   Financial Resource Strain: Low Risk  (12/09/2023)   Overall Financial Resource Strain (CARDIA)    Difficulty of Paying Living Expenses: Not hard at all  Food Insecurity: No Food Insecurity (12/09/2023)   Hunger Vital Sign    Worried About Running Out of Food in the Last Year: Never true    Ran Out of Food in the Last Year: Never true  Transportation Needs: No Transportation Needs (12/09/2023)   PRAPARE - Administrator, Civil Service (Medical): No    Lack of Transportation (Non-Medical): No  Physical Activity: Inactive (12/09/2023)   Exercise Vital Sign  Days of Exercise per Week: 0 days    Minutes of Exercise per Session: 0 min  Stress: No Stress Concern Present (12/09/2023)   [employer] of Occupational Health - Occupational Stress Questionnaire    Feeling of Stress: Not at all  Social Connections: Moderately Integrated (12/09/2023)   Social Connection and Isolation Panel    Frequency of Communication with Friends and Family: More than three times a week    Frequency of Social Gatherings with Friends and Family: Twice a week    Attends Religious Services: More than 4 times per year    Active Member of [employer] or Organizations: No    Attends Banker Meetings: Never    Marital Status: Married  Catering Manager Violence: Not At Risk (12/09/2023)   Humiliation,  Afraid, Rape, and Kick questionnaire    Fear of Current or Ex-Partner: No    Emotionally Abused: No    Physically Abused: No    Sexually Abused: No    Outpatient Medications Prior to Visit  Medication Sig Dispense Refill   albuterol  (VENTOLIN  HFA) 108 (90 Base) MCG/ACT inhaler Inhale 2 puffs into the lungs every 6 (six) hours as needed for wheezing or shortness of breath. 8 g 0   amLODipine  (NORVASC ) 5 MG tablet TAKE 1 TABLET EVERY DAY 90 tablet 3   cyclobenzaprine  (FLEXERIL ) 5 MG tablet Take 1 tablet (5 mg total) by mouth 3 (three) times daily as needed for muscle spasms. 90 tablet 3   folic acid -vitamin b complex-vitamin c-selenium-zinc (DIALYVITE) 3 MG TABS tablet Take 1 tablet by mouth daily.     levothyroxine  (SYNTHROID ) 125 MCG tablet Take 1 tablet (125 mcg total) by mouth daily before breakfast. 90 tablet 1   meclizine  (ANTIVERT ) 25 MG tablet Take 1 tablet (25 mg total) by mouth 3 (three) times daily as needed for dizziness. 21 tablet 0   olmesartan  (BENICAR ) 20 MG tablet TAKE 1 TABLET EVERY DAY 90 tablet 3   vitamin B-12 (CYANOCOBALAMIN ) 500 MCG tablet Take 500 mcg by mouth daily.     DULoxetine  (CYMBALTA ) 30 MG capsule Take 1 capsule (30 mg total) by mouth daily. 30 capsule 3   No facility-administered medications prior to visit.    Allergies  Allergen Reactions   Azithromycin Other (See Comments)    Caused hematuria.   Levaquin [Levofloxacin] Nausea And Vomiting   Sulfa Antibiotics Hives    Vomiting    Review of Systems  Constitutional:  Positive for fatigue. Negative for chills and fever.  HENT:  Positive for congestion, sinus pressure and sore throat.   Eyes:  Negative for pain and discharge.  Respiratory:  Positive for cough. Negative for shortness of breath.   Cardiovascular:  Negative for chest pain and palpitations.  Gastrointestinal:  Negative for abdominal pain, diarrhea, nausea and vomiting.  Endocrine: Negative for polydipsia and polyuria.  Genitourinary:   Negative for dysuria and hematuria.  Musculoskeletal:  Positive for neck pain. Negative for neck stiffness.  Skin:  Negative for rash.  Neurological:  Negative for dizziness and weakness.  Psychiatric/Behavioral:  Negative for agitation and behavioral problems.        Objective:    Physical Exam Vitals reviewed.  Constitutional:      General: Janet Gardner is not in acute distress.    Appearance: Janet Gardner is not diaphoretic.  HENT:     Head: Normocephalic and atraumatic.     Nose: Congestion present.     Right Sinus: Maxillary sinus tenderness and frontal sinus tenderness present.  Left Sinus: Maxillary sinus tenderness and frontal sinus tenderness present.     Mouth/Throat:     Mouth: Mucous membranes are moist.     Pharynx: Posterior oropharyngeal erythema present.  Eyes:     General: No scleral icterus.    Extraocular Movements: Extraocular movements intact.  Cardiovascular:     Rate and Rhythm: Normal rate and regular rhythm.     Heart sounds: Normal heart sounds. No murmur heard. Pulmonary:     Breath sounds: Normal breath sounds. No wheezing or rales.  Musculoskeletal:     Cervical back: Neck supple. Pain with movement present.     Right lower leg: No edema.     Left lower leg: No edema.  Skin:    General: Skin is warm.     Findings: No rash.  Neurological:     General: No focal deficit present.     Mental Status: Janet Gardner is alert and oriented to person, place, and time.  Psychiatric:        Mood and Affect: Mood normal.        Behavior: Behavior normal.     BP 134/84 (BP Location: Left Arm)   Pulse 85   Ht 5' 9 (1.753 m)   Wt 214 lb 6.4 oz (97.3 kg)   SpO2 98%   BMI 31.66 kg/m  Wt Readings from Last 3 Encounters:  12/13/23 214 lb 6.4 oz (97.3 kg)  12/09/23 210 lb (95.3 kg)  09/13/23 210 lb 0.6 oz (95.3 kg)        Assessment & Plan:   Problem List Items Addressed This Visit       Respiratory   Acute non-recurrent frontal sinusitis - Primary   Likely has  acute frontal and maxillary sinusitis Started empiric Augmentin  as Janet Gardner has persistent symptoms despite symptomatic treatment Continue Mucinex as needed for cough Flonase for nasal congestion Can perform sinus rinse as tolerated      Relevant Medications   amoxicillin -clavulanate (AUGMENTIN ) 875-125 MG tablet   predniSONE  (DELTASONE ) 20 MG tablet     Other   Fibromyalgia muscle pain   Likely has muscular strain in neck/scapular area Oral prednisone  prescribed Continue Cymbalta  30 mg QD, refilled Flexeril  as needed for muscle spasms Advised to apply warm compresses as needed for neck/scapular area pain      Relevant Medications   DULoxetine  (CYMBALTA ) 30 MG capsule   predniSONE  (DELTASONE ) 20 MG tablet     Meds ordered this encounter  Medications   amoxicillin -clavulanate (AUGMENTIN ) 875-125 MG tablet    Sig: Take 1 tablet by mouth 2 (two) times daily.    Dispense:  14 tablet    Refill:  0   DULoxetine  (CYMBALTA ) 30 MG capsule    Sig: Take 1 capsule (30 mg total) by mouth daily.    Dispense:  90 capsule    Refill:  1   predniSONE  (DELTASONE ) 20 MG tablet    Sig: Take 2 tablets (40 mg total) by mouth daily with breakfast.    Dispense:  10 tablet    Refill:  0     Jashawn Floyd MARLA Blanch, MD

## 2023-12-13 NOTE — Patient Instructions (Signed)
 Please start taking Augmentin  for sinus infection. Continue using Flonase for nasal congestion/allergies.  Please start taking Prednisone  as prescribed for neck pain.

## 2023-12-13 NOTE — Assessment & Plan Note (Signed)
 Likely has acute frontal and maxillary sinusitis Started empiric Augmentin as she has persistent symptoms despite symptomatic treatment Continue Mucinex as needed for cough Flonase for nasal congestion Can perform sinus rinse as tolerated

## 2023-12-16 NOTE — Assessment & Plan Note (Signed)
 Likely has muscular strain in neck/scapular area Oral prednisone  prescribed Continue Cymbalta  30 mg QD, refilled Flexeril  as needed for muscle spasms Advised to apply warm compresses as needed for neck/scapular area pain

## 2023-12-19 LAB — COLOGUARD

## 2023-12-22 ENCOUNTER — Ambulatory Visit: Payer: Self-pay

## 2023-12-22 ENCOUNTER — Encounter (HOSPITAL_COMMUNITY): Payer: Self-pay

## 2023-12-22 ENCOUNTER — Ambulatory Visit (HOSPITAL_COMMUNITY)
Admission: RE | Admit: 2023-12-22 | Discharge: 2023-12-22 | Disposition: A | Discharge status: Home / Self Care | Source: Ambulatory Visit

## 2023-12-22 DIAGNOSIS — Z1231 Encounter for screening mammogram for malignant neoplasm of breast: Secondary | ICD-10-CM | POA: Diagnosis not present

## 2023-12-22 DIAGNOSIS — Z780 Asymptomatic menopausal state: Secondary | ICD-10-CM | POA: Insufficient documentation

## 2023-12-22 DIAGNOSIS — Z1212 Encounter for screening for malignant neoplasm of rectum: Secondary | ICD-10-CM | POA: Diagnosis not present

## 2023-12-30 LAB — COLOGUARD: COLOGUARD: NEGATIVE

## 2024-01-02 ENCOUNTER — Ambulatory Visit
Admission: EM | Admit: 2024-01-02 | Discharge: 2024-01-02 | Disposition: A | Discharge status: Home / Self Care | Attending: Nurse Practitioner | Admitting: Nurse Practitioner

## 2024-01-02 ENCOUNTER — Encounter: Payer: Self-pay | Admitting: Emergency Medicine

## 2024-01-02 DIAGNOSIS — J0140 Acute pansinusitis, unspecified: Secondary | ICD-10-CM

## 2024-01-02 MED ORDER — AMOXICILLIN-POT CLAVULANATE 875-125 MG PO TABS: 1.0000 | ORAL_TABLET | Freq: Two times a day (BID) | ORAL | 0 refills | Status: DC

## 2024-01-02 NOTE — ED Provider Notes (Signed)
 [facility]    CSN: [id number] Arrival date & time: 01/02/24  0802      History   Chief Complaint No chief complaint on file.   HPI Janet Gardner is a 68 y.o. female.   The history is provided by the patient.   Patient presents with a 2-week history of head congestion, headache,postnasal drainage, sinus pressure, and cough.  Patient denies fever, chills, ear drainage, wheezing, difficulty breathing, abdominal pain, nausea, vomiting, diarrhea, or rash.  Patient reports that she has been using over-the-counter Mucinex and Flonase for her symptoms.  She denies any obvious close sick contacts.  Past Medical History:  Diagnosis Date   B12 deficiency    monthly injection   Fibromyalgia    GERD (gastroesophageal reflux disease)    Hyperlipidemia    Hypothyroidism    Osteopenia 06/11/2020    Patient Active Problem List   Diagnosis Date Noted   Prediabetes 09/21/2023   Muscle strain of right gluteal region 06/07/2023   Acute non-recurrent frontal sinusitis 03/30/2023   Viral illness 02/08/2023   Impacted cerumen of right ear 02/08/2023   Need for influenza vaccination 11/23/2022   Mixed hyperlipidemia 05/19/2022   Primary hypertension 06/11/2020   Chronic cystitis with hematuria 09/14/2019   Urge incontinence of urine 06/22/2019   PVC (premature ventricular contraction) 05/10/2019   Fibromyalgia muscle pain 09/02/2018   Vitamin D  deficiency 09/02/2018   Vitamin B12 deficiency 09/02/2018   Osteopenia determined by x-ray 09/02/2018   H/O: hysterectomy 09/02/2018   History of esophageal dilatation 09/02/2018   History of cholecystectomy 09/02/2018   Acquired hypothyroidism 06/14/2013   GERD 07/11/2009    Past Surgical History:  Procedure Laterality Date   ABDOMINAL HYSTERECTOMY     CHOLECYSTECTOMY     colonoscopy  2007   Dr. Dann, incomplete due to poor prep. scope passed to transverse colon. ACBE normal.   ESOPHAGOGASTRODUODENOSCOPY  07/22/2009    MFM:wnmfjo apperaring esophagus s/p 56 dilator/small HH/large ulcerated gastric polyp in the prepyloric antral area. Inflammatory fibroid polyp.   ESOPHAGOGASTRODUODENOSCOPY (EGD) WITH ESOPHAGEAL DILATION N/A 11/24/2012   Procedure: ESOPHAGOGASTRODUODENOSCOPY (EGD) WITH ESOPHAGEAL DILATION;  Surgeon: Lamar CHRISTELLA Hollingshead, MD;  Location: [facility];  Service: Endoscopy;  Laterality: N/A;  9:30AM   FOOT SURGERY     Right   KIDNEY SURGERY      OB History   No obstetric history on file.      Home Medications    Prior to Admission medications   Medication Sig Start Date End Date Taking? Authorizing Provider  albuterol  (VENTOLIN  HFA) 108 (90 Base) MCG/ACT inhaler Inhale 2 puffs into the lungs every 6 (six) hours as needed for wheezing or shortness of breath. 04/05/23   Tobie Suzzane POUR, MD  amLODipine  (NORVASC ) 5 MG tablet TAKE 1 TABLET EVERY DAY 07/28/23   Bevely Doffing, FNP  cyclobenzaprine  (FLEXERIL ) 5 MG tablet Take 1 tablet (5 mg total) by mouth 3 (three) times daily as needed for muscle spasms. 07/20/23   Bevely Doffing, FNP  DULoxetine  (CYMBALTA ) 30 MG capsule Take 1 capsule (30 mg total) by mouth daily. 12/13/23   Tobie Suzzane POUR, MD  folic acid -vitamin b complex-vitamin c-selenium-zinc (DIALYVITE) 3 MG TABS tablet Take 1 tablet by mouth daily.    [provider]  levothyroxine  (SYNTHROID ) 125 MCG tablet Take 1 tablet (125 mcg total) by mouth daily before breakfast. 09/21/23   Bevely Doffing, FNP  meclizine  (ANTIVERT ) 25 MG tablet Take 1 tablet (25 mg total) by mouth 3 (  three) times daily as needed for dizziness. 06/12/23   Daralene Bruckner D, PA-C  olmesartan  (BENICAR ) 20 MG tablet TAKE 1 TABLET EVERY DAY 03/15/23   Dixon, Phillip E, MD  vitamin B-12 (CYANOCOBALAMIN ) 500 MCG tablet Take 500 mcg by mouth daily.    [provider]    Family History Family History  Problem Relation Age of Onset   Breast cancer Mother    Heart disease Father    Colon cancer Neg Hx      Social History Social History   Tobacco Use   Smoking status: Never   Smokeless tobacco: Never  Vaping Use   Vaping status: Never Used  Substance Use Topics   Alcohol use: No   Drug use: No     Allergies   Azithromycin, Levaquin [levofloxacin], and Sulfa antibiotics   Review of Systems Review of Systems Per HPI  Physical Exam Triage Vital Signs ED Triage Vitals  Encounter Vitals Group     BP 01/02/24 0811 (!) 152/83     Girls Systolic BP Percentile --      Girls Diastolic BP Percentile --      Boys Systolic BP Percentile --      Boys Diastolic BP Percentile --      Pulse Rate 01/02/24 0811 87     Resp 01/02/24 0811 18     Temp 01/02/24 0811 97.8 F (36.6 C)     Temp Source 01/02/24 0811 Oral     SpO2 01/02/24 0811 94 %     Weight --      Height --      Head Circumference --      Peak Flow --      Pain Score 01/02/24 0812 6     Pain Loc --      Pain Education --      Exclude from Growth Chart --    No data found.  Updated Vital Signs BP (!) 152/83 (BP Location: Right Arm)   Pulse 87   Temp 97.8 F (36.6 C) (Oral)   Resp 18   SpO2 94%   Visual Acuity Right Eye Distance:   Left Eye Distance:   Bilateral Distance:    Right Eye Near:   Left Eye Near:    Bilateral Near:     Physical Exam Vitals and nursing note reviewed.  Constitutional:      General: She is not in acute distress.    Appearance: Normal appearance. She is well-developed.  HENT:     Head: Normocephalic and atraumatic.     Right Ear: Tympanic membrane, ear canal and external ear normal.     Left Ear: Tympanic membrane, ear canal and external ear normal.     Nose: Congestion present.     Right Turbinates: Enlarged and swollen.     Left Turbinates: Enlarged and swollen.     Right Sinus: Maxillary sinus tenderness and frontal sinus tenderness present.     Left Sinus: Maxillary sinus tenderness and frontal sinus tenderness present.     Mouth/Throat:     Lips: Pink.     Mouth:  Mucous membranes are moist.     Pharynx: Uvula midline. Postnasal drip present. No pharyngeal swelling, oropharyngeal exudate, posterior oropharyngeal erythema or uvula swelling.     Comments: Cobblestoning present to posterior oropharynx  Eyes:     Extraocular Movements: Extraocular movements intact.     Conjunctiva/sclera: Conjunctivae normal.     Pupils: Pupils are equal, round, and reactive to light.  Neck:     Thyroid : No thyromegaly.     Trachea: No tracheal deviation.  Cardiovascular:     Rate and Rhythm: Normal rate and regular rhythm.     Pulses: Normal pulses.     Heart sounds: Normal heart sounds.  Pulmonary:     Effort: Pulmonary effort is normal. No respiratory distress.     Breath sounds: Normal breath sounds. No stridor. No wheezing, rhonchi or rales.  Abdominal:     General: Bowel sounds are normal.     Palpations: Abdomen is soft.     Tenderness: There is no abdominal tenderness.  Musculoskeletal:     Cervical back: Normal range of motion and neck supple.  Skin:    General: Skin is warm and dry.  Neurological:     General: No focal deficit present.     Mental Status: She is alert and oriented to person, place, and time.  Psychiatric:        Mood and Affect: Mood normal.        Behavior: Behavior normal.        Thought Content: Thought content normal.        Judgment: Judgment normal.      UC Treatments / Results  Labs (all labs ordered are listed, but only abnormal results are displayed) Labs Reviewed - No data to display  EKG   Radiology No results found.  Procedures Procedures (including critical care time)  Medications Ordered in UC Medications - No data to display  Initial Impression / Assessment and Plan / UC Course  I have reviewed the triage vital signs and the nursing notes.  Pertinent labs & imaging results that were available during my care of the patient were reviewed by me and considered in my medical decision making (see chart for  details).  On exam, the patient's lung sounds are clear throughout, room air sats are at 94%.  She does exhibit both frontal and maxillary sinus tenderness on exam.  Given the duration of her symptoms and findings on her assessment, symptoms consistent with bacterial etiology.  Will treat for acute pansinusitis with Augmentin  875/125 mg.  Patient advised to continue Flonase daily, also advised patient to switch to Coricidin HBP for cough and for better control of her blood pressure.  Supportive care recommendations were provided and discussed with the patient to include fluids, rest, over-the-counter Tylenol , use of normal saline nasal spray, and use of a humidifier during sleep.  Discussed indications with patient regarding follow-up.  Patient was in agreement with this plan of care and verbalizes understanding.  All questions were answered.  Patient stable for discharge.  Final Clinical Impressions(s) / UC Diagnoses   Final diagnoses:  None   Discharge Instructions   None    ED Prescriptions   None    PDMP not reviewed this encounter.   Gilmer Etta PARAS, NP 01/02/24 [phone]

## 2024-01-02 NOTE — ED Triage Notes (Signed)
 Nasal drainage, facial pain x 2 weeks.  Has been taking mucinex DM without relief.

## 2024-01-02 NOTE — Discharge Instructions (Signed)
 Take medication as directed.  Continue Flonase daily. Recommend over-the-counter Coricidin HBP for your cough and to help control your blood pressure. Increase fluids and get plenty of rest. May take over-the-counter Tylenol  as needed for pain, fever, or general discomfort. Recommend normal saline nasal spray to help with nasal congestion throughout the day. For your cough, it may be helpful to use a humidifier at bedtime during sleep. If your symptoms fail to improve with this treatment, you may follow-up in this clinic or with your primary care physician for further evaluation. Follow-up as needed.

## 2024-01-04 ENCOUNTER — Other Ambulatory Visit: Payer: Self-pay

## 2024-01-04 DIAGNOSIS — R062 Wheezing: Secondary | ICD-10-CM

## 2024-01-04 MED ORDER — METHYLPREDNISOLONE 4 MG PO TBPK: ORAL_TABLET | ORAL | 0 refills | Status: DC

## 2024-01-04 MED ORDER — ALBUTEROL SULFATE HFA 108 (90 BASE) MCG/ACT IN AERS: 2.0000 | INHALATION_SPRAY | Freq: Four times a day (QID) | RESPIRATORY_TRACT | 0 refills | Status: DC | PRN

## 2024-01-10 NOTE — Telephone Encounter (Signed)
 Appt scheduled

## 2024-01-17 ENCOUNTER — Ambulatory Visit

## 2024-01-17 VITALS — BP 152/98 | HR 81 | Resp 18 | Ht 69.0 in | Wt 210.1 lb

## 2024-01-17 DIAGNOSIS — E538 Deficiency of other specified B group vitamins: Secondary | ICD-10-CM | POA: Diagnosis not present

## 2024-01-17 DIAGNOSIS — E559 Vitamin D deficiency, unspecified: Secondary | ICD-10-CM | POA: Diagnosis not present

## 2024-01-17 DIAGNOSIS — E669 Obesity, unspecified: Secondary | ICD-10-CM | POA: Diagnosis not present

## 2024-01-17 DIAGNOSIS — E039 Hypothyroidism, unspecified: Secondary | ICD-10-CM | POA: Diagnosis not present

## 2024-01-17 DIAGNOSIS — R7303 Prediabetes: Secondary | ICD-10-CM | POA: Diagnosis not present

## 2024-01-17 NOTE — Assessment & Plan Note (Signed)
 Thyroid  levels were slightly elevated previously. - Rechecked thyroid  levels today.

## 2024-01-17 NOTE — Progress Notes (Signed)
 Established Patient Office Visit  Subjective   Patient ID: Janet Gardner, female    DOB: 01/17/1956  Age: 68 y.o. MRN: [id number]  Chief Complaint  Patient presents with   Hypothyroidism    Follow up     HPI  Discussed the use of AI scribe software for clinical note transcription with the patient, who gave verbal consent to proceed.  History of Present Illness    Janet Gardner is a 68 year old female with hypertension and hypothyroidism who presents for follow-up of her blood pressure and thyroid  levels.  Respiratory symptoms - Recent episode of bronchitis treated with inhaler and prednisone  pack - Significant wheezing and coughing during episode, resulting in vomiting phlegm - Symptoms improved significantly after treatment - Currently no wheezing or coughing  Blood pressure management - Blood pressure fluctuating with home readings ranging from 134 to 143 mmHg systolic - Uses a blood pressure machine with a talking feature provided by insurance - Takes blood pressure medication in the evening, separate from thyroid  medication  Thyroid  function - History of hypothyroidism - Takes thyroid  medication first thing in the morning with water  on an empty stomach  Fibromyalgia - Takes Cymbalta  for fibromyalgia - No need for medication refills at this time     Patient Active Problem List   Diagnosis Date Noted   Obesity (BMI 30-39.9) 01/17/2024   Prediabetes 09/21/2023   Muscle strain of right gluteal region 06/07/2023   Acute non-recurrent frontal sinusitis 03/30/2023   Viral illness 02/08/2023   Impacted cerumen of right ear 02/08/2023   Need for influenza vaccination 11/23/2022   Mixed hyperlipidemia 05/19/2022   Primary hypertension 06/11/2020   Chronic cystitis with hematuria 09/14/2019   Urge incontinence of urine 06/22/2019   PVC (premature ventricular contraction) 05/10/2019   Fibromyalgia muscle pain 09/02/2018   Vitamin D  deficiency 09/02/2018   Vitamin B12  deficiency 09/02/2018   Osteopenia determined by x-ray 09/02/2018   H/O: hysterectomy 09/02/2018   History of esophageal dilatation 09/02/2018   History of cholecystectomy 09/02/2018   Acquired hypothyroidism 06/14/2013   GERD 07/11/2009    ROS    Objective:     BP (!) 152/98 (BP Location: Left Arm, Patient Position: Sitting, Cuff Size: Normal)   Pulse 81   Resp 18   Ht 5' 9 (1.753 m)   Wt 210 lb 1.3 oz (95.3 kg)   SpO2 95%   BMI 31.02 kg/m  BP Readings from Last 3 Encounters:  01/17/24 (!) 152/98  01/02/24 (!) 152/83  12/13/23 134/84   Wt Readings from Last 3 Encounters:  01/17/24 210 lb 1.3 oz (95.3 kg)  12/13/23 214 lb 6.4 oz (97.3 kg)  12/09/23 210 lb (95.3 kg)     Physical Exam Vitals and nursing note reviewed.  Constitutional:      Appearance: Normal appearance.  HENT:     Head: Normocephalic.  Eyes:     Extraocular Movements: Extraocular movements intact.     Pupils: Pupils are equal, round, and reactive to light.  Cardiovascular:     Rate and Rhythm: Normal rate and regular rhythm.  Pulmonary:     Effort: Pulmonary effort is normal.     Breath sounds: Normal breath sounds.  Musculoskeletal:     Cervical back: Normal range of motion and neck supple.  Neurological:     Mental Status: She is alert and oriented to person, place, and time.  Psychiatric:        Mood and Affect: Mood normal.  Thought Content: Thought content normal.    No results found for any visits on 01/17/24.    The 10-year ASCVD risk score (Arnett DK, et al., 2019) is: 18%    Assessment & Plan:   Problem List Items Addressed This Visit       Endocrine   Acquired hypothyroidism - Primary   Thyroid  levels were slightly elevated previously. - Rechecked thyroid  levels today.      Relevant Orders   TSH + free T4     Other   Vitamin D  deficiency   Recheck levels today.      Relevant Orders   Vitamin D  (25 hydroxy)   Vitamin B12 deficiency   Recheck levels  today.       Relevant Orders   B12   Prediabetes   - Repeat A1c level.       Relevant Orders   Hemoglobin A1c   Basic Metabolic Panel (BMET)   Obesity (BMI 30-39.9)   Recommend weight loss with healthy diet and regular exercise to lower BMI.       Relevant Orders   Hemoglobin A1c      No follow-ups on file.    Leita Longs, FNP

## 2024-01-17 NOTE — Assessment & Plan Note (Signed)
 Recheck levels today

## 2024-01-17 NOTE — Assessment & Plan Note (Signed)
 Recommend weight loss with healthy diet and regular exercise to lower BMI.

## 2024-01-17 NOTE — Assessment & Plan Note (Signed)
-   Repeat A1c level.

## 2024-01-18 LAB — BASIC METABOLIC PANEL WITH GFR
BUN/Creatinine Ratio: 18 (ref 12–28)
BUN: 12 mg/dL (ref 8–27)
CO2: 21 mmol/L (ref 20–29)
Calcium: 9 mg/dL (ref 8.7–10.3)
Chloride: 105 mmol/L (ref 96–106)
Creatinine, Ser: 0.67 mg/dL (ref 0.57–1.00)
Glucose: 95 mg/dL (ref 70–99)
Potassium: 4.2 mmol/L (ref 3.5–5.2)
Sodium: 143 mmol/L (ref 134–144)
eGFR: 94 mL/min/1.73 (ref >59)

## 2024-01-18 LAB — HEMOGLOBIN A1C
Est. average glucose Bld gHb Est-mCnc: 126 mg/dL
Hgb A1c MFr Bld: 6 % — ABNORMAL HIGH (ref 4.8–5.6)

## 2024-01-18 LAB — TSH+FREE T4
Free T4: 1.48 ng/dL (ref 0.82–1.77)
TSH: 6.1 u[IU]/mL — ABNORMAL HIGH (ref 0.450–4.500)

## 2024-01-18 LAB — VITAMIN B12: Vitamin B-12: 498 pg/mL (ref 232–1245)

## 2024-01-18 LAB — VITAMIN D 25 HYDROXY (VIT D DEFICIENCY, FRACTURES): Vit D, 25-Hydroxy: 31.5 ng/mL (ref 30.0–100.0)

## 2024-01-24 ENCOUNTER — Other Ambulatory Visit: Payer: Self-pay

## 2024-01-24 ENCOUNTER — Ambulatory Visit: Payer: Self-pay

## 2024-01-24 DIAGNOSIS — E039 Hypothyroidism, unspecified: Secondary | ICD-10-CM

## 2024-01-24 MED ORDER — LEVOTHYROXINE SODIUM 137 MCG PO TABS: 137.0000 ug | ORAL_TABLET | Freq: Every day | ORAL | 1 refills | Status: DC

## 2024-01-29 ENCOUNTER — Other Ambulatory Visit: Payer: Self-pay | Admitting: Internal Medicine

## 2024-01-29 DIAGNOSIS — I10 Essential (primary) hypertension: Secondary | ICD-10-CM

## 2024-02-14 ENCOUNTER — Other Ambulatory Visit: Payer: Self-pay

## 2024-02-14 ENCOUNTER — Ambulatory Visit (INDEPENDENT_AMBULATORY_CARE_PROVIDER_SITE_OTHER)

## 2024-02-14 ENCOUNTER — Ambulatory Visit
Admission: EM | Admit: 2024-02-14 | Discharge: 2024-02-14 | Disposition: A | Discharge status: Home / Self Care | Attending: Family Medicine | Admitting: Family Medicine

## 2024-02-14 DIAGNOSIS — J208 Acute bronchitis due to other specified organisms: Secondary | ICD-10-CM

## 2024-02-14 DIAGNOSIS — R051 Acute cough: Secondary | ICD-10-CM

## 2024-02-14 DIAGNOSIS — R062 Wheezing: Secondary | ICD-10-CM

## 2024-02-14 LAB — POCT INFLUENZA A/B
Influenza A, POC: NEGATIVE
Influenza B, POC: NEGATIVE

## 2024-02-14 MED ORDER — GUAIFENESIN ER 600 MG PO TB12: 600.0000 mg | ORAL_TABLET | Freq: Two times a day (BID) | ORAL | 0 refills | Status: AC

## 2024-02-14 MED ORDER — DEXAMETHASONE SOD PHOSPHATE PF 10 MG/ML IJ SOLN
10.0000 mg | Freq: Once | INTRAMUSCULAR | Status: AC
  Administered 2024-02-14: 10 mg via INTRAMUSCULAR

## 2024-02-14 MED ORDER — PROMETHAZINE-DM 6.25-15 MG/5ML PO SYRP: 5.0000 mL | ORAL_SOLUTION | Freq: Four times a day (QID) | ORAL | 0 refills | Status: AC | PRN

## 2024-02-14 MED ORDER — IPRATROPIUM-ALBUTEROL 0.5-2.5 (3) MG/3ML IN SOLN
3.0000 mL | Freq: Once | RESPIRATORY_TRACT | Status: AC
  Administered 2024-02-14: 3 mL via RESPIRATORY_TRACT

## 2024-02-14 MED ORDER — OSELTAMIVIR PHOSPHATE 75 MG PO CAPS: 75.0000 mg | ORAL_CAPSULE | Freq: Two times a day (BID) | ORAL | 0 refills | Status: AC

## 2024-02-14 NOTE — Telephone Encounter (Signed)
Called patient she hung up

## 2024-02-14 NOTE — Telephone Encounter (Signed)
 Called patient back and she said will call our office back currently at the urgent care

## 2024-02-14 NOTE — Discharge Instructions (Addendum)
 We have given you a steroid shot today to help with the cough and wheezing, continue the albuterol  inhaler 2 puffs every 4 hours as needed, and I have prescribed Mucinex  and a cough syrup to help additionally with symptoms.  Follow-up for worsening or unresolving symptoms.  Your chest x-ray was negative for pneumonia and your flu testing was negative.

## 2024-02-14 NOTE — ED Triage Notes (Signed)
 Cough, runny nose, congestion x 1 day. Pt states her husband had flu A last week.  Using albuterol  inhaler, and taking tylenol  and delsym.

## 2024-02-14 NOTE — ED Provider Notes (Signed)
 " [facility]    CSN: [id number] Arrival date & time: 02/14/24  0809      History   Chief Complaint Chief Complaint  Patient presents with   Cough    HPI Janet Gardner is a 69 y.o. female.   Patient presenting today with 1 day history of cough, runny nose, congestion, wheezing, chest tightness.  Denies fever, body aches, abdominal pain, vomiting, diarrhea, shortness of breath.  So far trying albuterol , Tylenol  and Delsym with minimal relief.  No diagnosed history of chronic pulmonary disease but does get frequent bronchitis.  Husband diagnosed several days ago with flu A.    Past Medical History:  Diagnosis Date   B12 deficiency    monthly injection   Fibromyalgia    GERD (gastroesophageal reflux disease)    Hyperlipidemia    Hypothyroidism    Osteopenia 06/11/2020    Patient Active Problem List   Diagnosis Date Noted   Obesity (BMI 30-39.9) 01/17/2024   Prediabetes 09/21/2023   Muscle strain of right gluteal region 06/07/2023   Acute non-recurrent frontal sinusitis 03/30/2023   Viral illness 02/08/2023   Impacted cerumen of right ear 02/08/2023   Need for influenza vaccination 11/23/2022   Mixed hyperlipidemia 05/19/2022   Primary hypertension 06/11/2020   Chronic cystitis with hematuria 09/14/2019   Urge incontinence of urine 06/22/2019   PVC (premature ventricular contraction) 05/10/2019   Fibromyalgia muscle pain 09/02/2018   Vitamin D  deficiency 09/02/2018   Vitamin B12 deficiency 09/02/2018   Osteopenia determined by x-ray 09/02/2018   H/O: hysterectomy 09/02/2018   History of esophageal dilatation 09/02/2018   History of cholecystectomy 09/02/2018   Acquired hypothyroidism 06/14/2013   GERD 07/11/2009    Past Surgical History:  Procedure Laterality Date   ABDOMINAL HYSTERECTOMY     CHOLECYSTECTOMY     colonoscopy  2007   Dr. Dann, incomplete due to poor prep. scope passed to transverse colon. ACBE normal.   ESOPHAGOGASTRODUODENOSCOPY   07/22/2009   MFM:wnmfjo apperaring esophagus s/p 56 dilator/small HH/large ulcerated gastric polyp in the prepyloric antral area. Inflammatory fibroid polyp.   ESOPHAGOGASTRODUODENOSCOPY (EGD) WITH ESOPHAGEAL DILATION N/A 11/24/2012   Procedure: ESOPHAGOGASTRODUODENOSCOPY (EGD) WITH ESOPHAGEAL DILATION;  Surgeon: Lamar CHRISTELLA Hollingshead, MD;  Location: [facility];  Service: Endoscopy;  Laterality: N/A;  9:30AM   FOOT SURGERY     Right   KIDNEY SURGERY      OB History   No obstetric history on file.      Home Medications    Prior to Admission medications  Medication Sig Start Date End Date Taking? Authorizing Provider  albuterol  (VENTOLIN  HFA) 108 (90 Base) MCG/ACT inhaler Inhale 2 puffs into the lungs every 6 (six) hours as needed for wheezing or shortness of breath. 01/04/24  Yes Bevely Doffing, FNP  amLODipine  (NORVASC ) 5 MG tablet TAKE 1 TABLET EVERY DAY 07/28/23  Yes Bevely Doffing, FNP  DULoxetine  (CYMBALTA ) 30 MG capsule Take 1 capsule (30 mg total) by mouth daily. 12/13/23  Yes Tobie Suzzane POUR, MD  guaiFENesin  (MUCINEX ) 600 MG 12 hr tablet Take 1 tablet (600 mg total) by mouth 2 (two) times daily. 02/14/24  Yes Stuart Vernell Norris, PA-C  levothyroxine  (SYNTHROID ) 137 MCG tablet Take 1 tablet (137 mcg total) by mouth daily before breakfast. 01/24/24  Yes Bevely Doffing, FNP  olmesartan  (BENICAR ) 20 MG tablet TAKE 1 TABLET EVERY DAY 01/31/24  Yes Bevely Doffing, FNP  promethazine -dextromethorphan (PROMETHAZINE -DM) 6.25-15 MG/5ML syrup Take 5 mLs by mouth 4 (four) times daily  as needed. 02/14/24  Yes Stuart Vernell Norris, PA-C  vitamin B-12 (CYANOCOBALAMIN ) 500 MCG tablet Take 500 mcg by mouth daily.   Yes [provider]  cyclobenzaprine  (FLEXERIL ) 5 MG tablet Take 1 tablet (5 mg total) by mouth 3 (three) times daily as needed for muscle spasms. 07/20/23   Bevely Doffing, FNP  folic acid -vitamin b complex-vitamin c-selenium-zinc (DIALYVITE) 3 MG TABS tablet Take 1 tablet by mouth  daily. Patient not taking: Reported on 02/14/2024    [provider]  meclizine  (ANTIVERT ) 25 MG tablet Take 1 tablet (25 mg total) by mouth 3 (three) times daily as needed for dizziness. 06/12/23   Daralene Lonni BIRCH, PA-C    Family History Family History  Problem Relation Age of Onset   Breast cancer Mother    Heart disease Father    Colon cancer Neg Hx     Social History Social History[1]   Allergies   Azithromycin, Levaquin [levofloxacin], and Sulfa antibiotics  Review of Systems Review of Systems PER HPI  Physical Exam Triage Vital Signs ED Triage Vitals  Encounter Vitals Group     BP 02/14/24 0823 (!) 140/85     Girls Systolic BP Percentile --      Girls Diastolic BP Percentile --      Boys Systolic BP Percentile --      Boys Diastolic BP Percentile --      Pulse Rate 02/14/24 0823 96     Resp 02/14/24 0823 18     Temp 02/14/24 0823 97.9 F (36.6 C)     Temp Source 02/14/24 0823 Oral     SpO2 02/14/24 0823 96 %     Weight --      Height --      Head Circumference --      Peak Flow --      Pain Score 02/14/24 0824 0     Pain Loc --      Pain Education --      Exclude from Growth Chart --    No data found.  Updated Vital Signs BP (!) 140/85 (BP Location: Right Arm)   Pulse 96   Temp 97.9 F (36.6 C) (Oral)   Resp 18   SpO2 96%   Visual Acuity Right Eye Distance:   Left Eye Distance:   Bilateral Distance:    Right Eye Near:   Left Eye Near:    Bilateral Near:     Physical Exam Vitals and nursing note reviewed.  Constitutional:      Appearance: Normal appearance.  HENT:     Head: Atraumatic.     Right Ear: Tympanic membrane and external ear normal.     Left Ear: Tympanic membrane and external ear normal.     Nose: Rhinorrhea present.     Mouth/Throat:     Mouth: Mucous membranes are moist.     Pharynx: Posterior oropharyngeal erythema present.  Eyes:     Extraocular Movements: Extraocular movements intact.      Conjunctiva/sclera: Conjunctivae normal.  Cardiovascular:     Rate and Rhythm: Normal rate and regular rhythm.     Heart sounds: Normal heart sounds.  Pulmonary:     Effort: Pulmonary effort is normal.     Breath sounds: Wheezing present. No rales.  Musculoskeletal:        General: Normal range of motion.     Cervical back: Normal range of motion and neck supple.  Skin:    General: Skin is warm and dry.  Neurological:     Mental Status: She is alert and oriented to person, place, and time.  Psychiatric:        Mood and Affect: Mood normal.        Thought Content: Thought content normal.      UC Treatments / Results  Labs (all labs ordered are listed, but only abnormal results are displayed) Labs Reviewed  POCT INFLUENZA A/B    EKG   Radiology DG Chest 2 View Result Date: 02/14/2024 EXAM: PA AND LATERAL (2) VIEW(S) XRAY OF THE CHEST 02/14/2024 09:10:41 AM COMPARISON: PA and lateral radiographs of the chest dated 05/07/2007. CLINICAL HISTORY: productive cough, wheezing x 2 days FINDINGS: LUNGS AND PLEURA: No focal pulmonary opacity. No pleural effusion. No pneumothorax. HEART AND MEDIASTINUM: No acute abnormality of the cardiac and mediastinal silhouettes. BONES AND SOFT TISSUES: No acute osseous abnormality. IMPRESSION: 1. No acute cardiopulmonary process. No focal consolidation, pleural effusion, or pneumothorax. Electronically signed by: Evalene Coho MD 02/14/2024 09:45 AM EST RP Workstation: [id number]    Procedures Procedures (including critical care time)  Medications Ordered in UC Medications  ipratropium-albuterol  (DUONEB) 0.5-2.5 (3) MG/3ML nebulizer solution 3 mL (3 mLs Nebulization Given 02/14/24 0912)  dexamethasone  (DECADRON ) injection 10 mg (10 mg Intramuscular Given 02/14/24 1000)    Initial Impression / Assessment and Plan / UC Course  I have reviewed the triage vital signs and the nursing notes.  Pertinent labs & imaging results that were available during  my care of the patient were reviewed by me and considered in my medical decision making (see chart for details).     Mildly hypertensive in triage, otherwise vital signs reassuring.  She is well-appearing and in no acute distress.  Rapid flu negative, chest x-ray negative for acute cardiopulmonary abnormality.  Suspect viral bronchitis.  Treat with IM Decadron , Phenergan  DM, Mucinex , albuterol  every 4 hours as needed which she states she already has at home.  Return for worsening or unresolving symptoms.  Final Clinical Impressions(s) / UC Diagnoses   Final diagnoses:  Acute cough  Viral bronchitis  Wheezing     Discharge Instructions      We have given you a steroid shot today to help with the cough and wheezing, continue the albuterol  inhaler 2 puffs every 4 hours as needed, and I have prescribed Mucinex  and a cough syrup to help additionally with symptoms.  Follow-up for worsening or unresolving symptoms.  Your chest x-ray was negative for pneumonia and your flu testing was negative.    ED Prescriptions     Medication Sig Dispense Auth. Provider   promethazine -dextromethorphan (PROMETHAZINE -DM) 6.25-15 MG/5ML syrup Take 5 mLs by mouth 4 (four) times daily as needed. 100 mL Stuart Vernell Norris, PA-C   guaiFENesin  (MUCINEX ) 600 MG 12 hr tablet Take 1 tablet (600 mg total) by mouth 2 (two) times daily. 20 tablet Stuart Vernell Norris, [place]      PDMP not reviewed this encounter.    [1]  Social History Tobacco Use   Smoking status: Never   Smokeless tobacco: Never  Vaping Use   Vaping status: Never Used  Substance Use Topics   Alcohol use: No   Drug use: No     Stuart Vernell Norris, PA-C 02/14/24 1035  "

## 2024-02-16 ENCOUNTER — Other Ambulatory Visit: Payer: Self-pay

## 2024-02-16 DIAGNOSIS — R062 Wheezing: Secondary | ICD-10-CM

## 2024-02-17 ENCOUNTER — Encounter: Payer: Self-pay | Admitting: Emergency Medicine

## 2024-02-17 ENCOUNTER — Ambulatory Visit
Admission: EM | Admit: 2024-02-17 | Discharge: 2024-02-17 | Disposition: A | Discharge status: Home / Self Care | Attending: Nurse Practitioner | Admitting: Nurse Practitioner

## 2024-02-17 DIAGNOSIS — R062 Wheezing: Secondary | ICD-10-CM

## 2024-02-17 DIAGNOSIS — J208 Acute bronchitis due to other specified organisms: Secondary | ICD-10-CM | POA: Diagnosis not present

## 2024-02-17 MED ORDER — PREDNISONE 20 MG PO TABS: 40.0000 mg | ORAL_TABLET | Freq: Every day | ORAL | 0 refills | Status: AC

## 2024-02-17 NOTE — Discharge Instructions (Addendum)
 Take medication as prescribed.  Continue your current medications as previously prescribed.  I would like for you to begin Coricidin HBP to help lower your blood pressure. Increase fluids and allow for plenty of rest. You may take over-the-counter Tylenol  as needed for pain, fever, or general discomfort. Recommend the use of a humidifier in your bedroom at nighttime during sleep and sleeping elevated on pillows while symptoms persist. As discussed, your cough may last from days to weeks.  If you are generally feeling well, but continued to have a persistent nagging cough, continue over-the-counter cough medications as needed.  Seek care if you develop fever, chills, worsening wheezing, shortness of breath, difficulty breathing, or other concerns. Follow-up as needed.

## 2024-02-17 NOTE — ED Triage Notes (Signed)
 Was seen on 1/5 for cold symptoms.  States chest feels tight today in mid chest area.  States took mucinex  today and that made the chest tightness better.

## 2024-02-17 NOTE — ED Provider Notes (Signed)
 " [facility]    CSN: [id number] Arrival date & time: 02/17/24  1729      History   Chief Complaint No chief complaint on file.   HPI Janet Gardner is a 69 y.o. female.   The history is provided by the patient.   Patient presents for complaints of chest tightness that occurred earlier today.  Patient was seen in this clinic 2 days ago and diagnosed with bronchitis.  She was negative for influenza.  Chest x-ray was negative for pneumonia.  Patient was given an injection of dexamethasone  in the clinic.  She was started on guaifenesin  600 mg and Promethazine  DM.  Patient states she did have some chest tightness today, states that she took some Mucinex  and she is feeling much better.  She states when am I going to to be able to get rid of this stuff.  She denies new fever, chills, headache, ear pain, chest pain, difficulty breathing, abdominal pain, nausea, vomiting, or diarrhea.  Past Medical History:  Diagnosis Date   B12 deficiency    monthly injection   Fibromyalgia    GERD (gastroesophageal reflux disease)    Hyperlipidemia    Hypothyroidism    Osteopenia 06/11/2020    Patient Active Problem List   Diagnosis Date Noted   Obesity (BMI 30-39.9) 01/17/2024   Prediabetes 09/21/2023   Muscle strain of right gluteal region 06/07/2023   Acute non-recurrent frontal sinusitis 03/30/2023   Viral illness 02/08/2023   Impacted cerumen of right ear 02/08/2023   Need for influenza vaccination 11/23/2022   Mixed hyperlipidemia 05/19/2022   Primary hypertension 06/11/2020   Chronic cystitis with hematuria 09/14/2019   Urge incontinence of urine 06/22/2019   PVC (premature ventricular contraction) 05/10/2019   Fibromyalgia muscle pain 09/02/2018   Vitamin D  deficiency 09/02/2018   Vitamin B12 deficiency 09/02/2018   Osteopenia determined by x-ray 09/02/2018   H/O: hysterectomy 09/02/2018   History of esophageal dilatation 09/02/2018   History of cholecystectomy  09/02/2018   Acquired hypothyroidism 06/14/2013   GERD 07/11/2009    Past Surgical History:  Procedure Laterality Date   ABDOMINAL HYSTERECTOMY     CHOLECYSTECTOMY     colonoscopy  2007   Dr. Dann, incomplete due to poor prep. scope passed to transverse colon. ACBE normal.   ESOPHAGOGASTRODUODENOSCOPY  07/22/2009   MFM:wnmfjo apperaring esophagus s/p 56 dilator/small HH/large ulcerated gastric polyp in the prepyloric antral area. Inflammatory fibroid polyp.   ESOPHAGOGASTRODUODENOSCOPY (EGD) WITH ESOPHAGEAL DILATION N/A 11/24/2012   Procedure: ESOPHAGOGASTRODUODENOSCOPY (EGD) WITH ESOPHAGEAL DILATION;  Surgeon: Lamar CHRISTELLA Hollingshead, MD;  Location: [facility];  Service: Endoscopy;  Laterality: N/A;  9:30AM   FOOT SURGERY     Right   KIDNEY SURGERY      OB History   No obstetric history on file.      Home Medications    Prior to Admission medications  Medication Sig Start Date End Date Taking? Authorizing Provider  albuterol  (VENTOLIN  HFA) 108 (90 Base) MCG/ACT inhaler INHALE 2 PUFFS INTO LUNGS EVERY 6 HOURS AS NEEDED FOR WHEEZING OR SHORTNESS OF BREATH 02/16/24   Bevely Doffing, FNP  amLODipine  (NORVASC ) 5 MG tablet TAKE 1 TABLET EVERY DAY 07/28/23   Bevely Doffing, FNP  cyclobenzaprine  (FLEXERIL ) 5 MG tablet Take 1 tablet (5 mg total) by mouth 3 (three) times daily as needed for muscle spasms. 07/20/23   Bevely Doffing, FNP  DULoxetine  (CYMBALTA ) 30 MG capsule Take 1 capsule (30 mg total) by mouth daily. 12/13/23  Tobie Suzzane POUR, MD  guaiFENesin  (MUCINEX ) 600 MG 12 hr tablet Take 1 tablet (600 mg total) by mouth 2 (two) times daily. 02/14/24   Stuart Vernell Norris, PA-C  levothyroxine  (SYNTHROID ) 137 MCG tablet Take 1 tablet (137 mcg total) by mouth daily before breakfast. 01/24/24   Bevely Doffing, FNP  meclizine  (ANTIVERT ) 25 MG tablet Take 1 tablet (25 mg total) by mouth 3 (three) times daily as needed for dizziness. 06/12/23   Daralene Lonni BIRCH, PA-C  olmesartan  (BENICAR ) 20 MG  tablet TAKE 1 TABLET EVERY DAY 01/31/24   Bevely Doffing, FNP  oseltamivir  (TAMIFLU ) 75 MG capsule Take 1 capsule (75 mg total) by mouth 2 (two) times daily for 5 days. 02/14/24 02/19/24  Bevely Doffing, FNP  promethazine -dextromethorphan (PROMETHAZINE -DM) 6.25-15 MG/5ML syrup Take 5 mLs by mouth 4 (four) times daily as needed. 02/14/24   Stuart Vernell Norris, PA-C  vitamin B-12 (CYANOCOBALAMIN ) 500 MCG tablet Take 500 mcg by mouth daily.    [provider]    Family History Family History  Problem Relation Age of Onset   Breast cancer Mother    Heart disease Father    Colon cancer Neg Hx     Social History Social History[1]   Allergies   Azithromycin, Levaquin [levofloxacin], and Sulfa antibiotics   Review of Systems Review of Systems Per HPI  Physical Exam Triage Vital Signs ED Triage Vitals  Encounter Vitals Group     BP 02/17/24 1816 (!) 187/98     Girls Systolic BP Percentile --      Girls Diastolic BP Percentile --      Boys Systolic BP Percentile --      Boys Diastolic BP Percentile --      Pulse Rate 02/17/24 1816 81     Resp 02/17/24 1816 18     Temp 02/17/24 1816 97.8 F (36.6 C)     Temp Source 02/17/24 1816 Oral     SpO2 02/17/24 1816 95 %     Weight --      Height --      Head Circumference --      Peak Flow --      Pain Score 02/17/24 1819 6     Pain Loc --      Pain Education --      Exclude from Growth Chart --    No data found.  Updated Vital Signs BP (!) 187/98 (BP Location: Right Arm)   Pulse 81   Temp 97.8 F (36.6 C) (Oral)   Resp 18   SpO2 95%   Visual Acuity Right Eye Distance:   Left Eye Distance:   Bilateral Distance:    Right Eye Near:   Left Eye Near:    Bilateral Near:     Physical Exam Vitals and nursing note reviewed.  Constitutional:      General: She is not in acute distress.    Appearance: Normal appearance.  HENT:     Head: Normocephalic.     Right Ear: Tympanic membrane, ear canal and external ear  normal.     Left Ear: Tympanic membrane, ear canal and external ear normal.     Nose: Nose normal.     Mouth/Throat:     Mouth: Mucous membranes are moist.  Eyes:     Extraocular Movements: Extraocular movements intact.     Conjunctiva/sclera: Conjunctivae normal.     Pupils: Pupils are equal, round, and reactive to light.  Cardiovascular:     Rate and  Rhythm: Normal rate and regular rhythm.     Pulses: Normal pulses.     Heart sounds: Normal heart sounds.  Pulmonary:     Effort: Pulmonary effort is normal.     Breath sounds: Wheezing present.  Abdominal:     General: Bowel sounds are normal.     Palpations: Abdomen is soft.  Musculoskeletal:     Cervical back: Normal range of motion.  Skin:    General: Skin is warm and dry.  Neurological:     General: No focal deficit present.     Mental Status: She is alert and oriented to person, place, and time.  Psychiatric:        Mood and Affect: Mood normal.        Behavior: Behavior normal.      UC Treatments / Results  Labs (all labs ordered are listed, but only abnormal results are displayed) Labs Reviewed - No data to display  EKG   Radiology No results found.  Procedures Procedures (including critical care time)  Medications Ordered in UC Medications - No data to display  Initial Impression / Assessment and Plan / UC Course  I have reviewed the triage vital signs and the nursing notes.  Pertinent labs & imaging results that were available during my care of the patient were reviewed by me and considered in my medical decision making (see chart for details).  On exam, patient with faint expiratory wheezing noted in the posterior lower lung fields.  Symptoms are consistent with previous diagnosis of viral bronchitis.  The patient is well-appearing, she is in no acute distress, she is hypertensive, but vital signs are otherwise stable.  Patient reports that she did take Mucinex  earlier today which may be contributing to  her elevated blood pressure.  Will treat with prednisone  40 mg to add to her current treatment regimen.  Recommend patient switching to Coricidin HBP to help lower her blood pressure and to treat that continued cough.  Supportive care recommendations were provided discussed with the patient to include fluids, rest, over-the-counter analgesics, and use of a humidifier during sleep.  Discussed indications with the patient regarding follow-up.  Patient was in agreement with this plan of care and verbalizes understanding.  All questions were answered.  Patient stable for discharge.   Final Clinical Impressions(s) / UC Diagnoses   Final diagnoses:  None   Discharge Instructions   None    ED Prescriptions   None    PDMP not reviewed this encounter.    [1]  Social History Tobacco Use   Smoking status: Never   Smokeless tobacco: Never  Vaping Use   Vaping status: Never Used  Substance Use Topics   Alcohol use: No   Drug use: No     Gilmer Etta PARAS, NP 02/17/24 1837  "

## 2024-02-28 ENCOUNTER — Other Ambulatory Visit: Payer: Self-pay

## 2024-02-28 DIAGNOSIS — E039 Hypothyroidism, unspecified: Secondary | ICD-10-CM

## 2024-02-28 MED ORDER — LEVOTHYROXINE SODIUM 137 MCG PO TABS: 137.0000 ug | ORAL_TABLET | Freq: Every day | ORAL | 1 refills | Status: AC

## 2024-12-12 ENCOUNTER — Ambulatory Visit
